# Patient Record
Sex: Male | Born: 1955
Health system: Southern US, Community
[De-identification: ages and names within clinical notes are randomized; demographics above are authoritative.]

## PROBLEM LIST (undated history)

## (undated) DIAGNOSIS — T7840XA Allergy, unspecified, initial encounter: Secondary | ICD-10-CM

## (undated) DIAGNOSIS — I10 Essential (primary) hypertension: Secondary | ICD-10-CM

## (undated) DIAGNOSIS — I2510 Atherosclerotic heart disease of native coronary artery without angina pectoris: Secondary | ICD-10-CM

## (undated) DIAGNOSIS — E785 Hyperlipidemia, unspecified: Secondary | ICD-10-CM

## (undated) DIAGNOSIS — K219 Gastro-esophageal reflux disease without esophagitis: Secondary | ICD-10-CM

## (undated) DIAGNOSIS — J329 Chronic sinusitis, unspecified: Secondary | ICD-10-CM

## (undated) DIAGNOSIS — M1990 Unspecified osteoarthritis, unspecified site: Secondary | ICD-10-CM

## (undated) HISTORY — DX: Unspecified osteoarthritis, unspecified site: M19.90

## (undated) HISTORY — PX: TONSILLECTOMY: SUR1361

## (undated) HISTORY — DX: Chronic sinusitis, unspecified: J32.9

## (undated) HISTORY — DX: Allergy, unspecified, initial encounter: T78.40XA

## (undated) HISTORY — PX: CARDIAC CATHETERIZATION: SHX172

## (undated) HISTORY — PX: OTHER SURGICAL HISTORY: SHX169

## (undated) HISTORY — DX: Essential (primary) hypertension: I10

## (undated) HISTORY — DX: Gastro-esophageal reflux disease without esophagitis: K21.9

---

## 2000-03-24 ENCOUNTER — Ambulatory Visit (HOSPITAL_COMMUNITY): Admission: RE | Admit: 2000-03-24 | Discharge: 2000-03-24 | Payer: Self-pay | Admitting: Gastroenterology

## 2002-10-23 ENCOUNTER — Encounter: Payer: Self-pay | Admitting: Surgery

## 2002-10-23 ENCOUNTER — Encounter: Admission: RE | Admit: 2002-10-23 | Discharge: 2002-10-23 | Payer: Self-pay | Admitting: Surgery

## 2004-05-16 HISTORY — PX: COLONOSCOPY: SHX174

## 2006-01-17 ENCOUNTER — Ambulatory Visit: Payer: Self-pay | Admitting: Family Medicine

## 2006-06-06 ENCOUNTER — Ambulatory Visit: Payer: Self-pay | Admitting: Family Medicine

## 2006-06-12 ENCOUNTER — Ambulatory Visit: Payer: Self-pay | Admitting: Family Medicine

## 2006-12-01 ENCOUNTER — Ambulatory Visit: Payer: Self-pay | Admitting: Family Medicine

## 2006-12-21 ENCOUNTER — Ambulatory Visit: Payer: Self-pay | Admitting: Family Medicine

## 2006-12-27 ENCOUNTER — Encounter: Admission: RE | Admit: 2006-12-27 | Discharge: 2006-12-27 | Payer: Self-pay | Admitting: Family Medicine

## 2007-06-14 ENCOUNTER — Ambulatory Visit: Payer: Self-pay | Admitting: Family Medicine

## 2007-06-26 ENCOUNTER — Ambulatory Visit: Payer: Self-pay | Admitting: Family Medicine

## 2007-07-02 ENCOUNTER — Ambulatory Visit: Payer: Self-pay | Admitting: Cardiology

## 2007-07-05 ENCOUNTER — Ambulatory Visit: Payer: Self-pay | Admitting: Family Medicine

## 2007-07-06 ENCOUNTER — Encounter: Admission: RE | Admit: 2007-07-06 | Discharge: 2007-07-06 | Payer: Self-pay | Admitting: Family Medicine

## 2007-07-13 ENCOUNTER — Encounter: Payer: Self-pay | Admitting: Cardiology

## 2007-07-13 ENCOUNTER — Ambulatory Visit: Payer: Self-pay

## 2007-10-30 ENCOUNTER — Ambulatory Visit: Payer: Self-pay | Admitting: Family Medicine

## 2008-04-22 ENCOUNTER — Ambulatory Visit: Payer: Self-pay | Admitting: Family Medicine

## 2008-08-04 ENCOUNTER — Ambulatory Visit: Payer: Self-pay | Admitting: Family Medicine

## 2008-09-04 ENCOUNTER — Ambulatory Visit: Payer: Self-pay | Admitting: Family Medicine

## 2008-10-21 ENCOUNTER — Ambulatory Visit: Payer: Self-pay | Admitting: Family Medicine

## 2008-10-23 ENCOUNTER — Encounter: Admission: RE | Admit: 2008-10-23 | Discharge: 2008-10-23 | Payer: Self-pay | Admitting: Family Medicine

## 2008-11-25 ENCOUNTER — Ambulatory Visit: Payer: Self-pay | Admitting: Family Medicine

## 2009-04-15 ENCOUNTER — Ambulatory Visit: Payer: Self-pay | Admitting: Family Medicine

## 2009-10-08 ENCOUNTER — Ambulatory Visit: Payer: Self-pay | Admitting: Family Medicine

## 2010-04-23 ENCOUNTER — Ambulatory Visit: Payer: Self-pay | Admitting: Family Medicine

## 2010-09-13 ENCOUNTER — Encounter: Payer: Self-pay | Admitting: Family Medicine

## 2010-09-23 ENCOUNTER — Encounter: Payer: Self-pay | Admitting: Family Medicine

## 2010-09-23 ENCOUNTER — Ambulatory Visit (INDEPENDENT_AMBULATORY_CARE_PROVIDER_SITE_OTHER): Payer: 59 | Admitting: Family Medicine

## 2010-09-23 VITALS — BP 130/90 | HR 60 | Ht 74.4 in | Wt 195.0 lb

## 2010-09-23 DIAGNOSIS — K219 Gastro-esophageal reflux disease without esophagitis: Secondary | ICD-10-CM

## 2010-09-23 DIAGNOSIS — J301 Allergic rhinitis due to pollen: Secondary | ICD-10-CM

## 2010-09-23 DIAGNOSIS — I10 Essential (primary) hypertension: Secondary | ICD-10-CM

## 2010-09-23 DIAGNOSIS — Z0000 Encounter for general adult medical examination without abnormal findings: Secondary | ICD-10-CM

## 2010-09-23 DIAGNOSIS — Z125 Encounter for screening for malignant neoplasm of prostate: Secondary | ICD-10-CM

## 2010-09-23 LAB — POCT URINALYSIS DIPSTICK
Bilirubin, UA: NEGATIVE
Blood, UA: 250
Glucose, UA: NEGATIVE
Ketones, UA: NEGATIVE
Spec Grav, UA: 1.02

## 2010-09-23 LAB — CBC WITH DIFFERENTIAL/PLATELET
Basophils Absolute: 0 10*3/uL (ref 0.0–0.1)
Eosinophils Relative: 2 % (ref 0–5)
Lymphocytes Relative: 36 % (ref 12–46)
Lymphs Abs: 2.2 10*3/uL (ref 0.7–4.0)
MCV: 90.6 fL (ref 78.0–100.0)
Neutro Abs: 3.2 10*3/uL (ref 1.7–7.7)
Platelets: 205 10*3/uL (ref 150–400)
RBC: 5.09 MIL/uL (ref 4.22–5.81)
RDW: 13 % (ref 11.5–15.5)
WBC: 6.1 10*3/uL (ref 4.0–10.5)

## 2010-09-23 LAB — COMPREHENSIVE METABOLIC PANEL
ALT: 19 U/L (ref 0–53)
CO2: 25 mEq/L (ref 19–32)
Calcium: 9.2 mg/dL (ref 8.4–10.5)
Chloride: 103 mEq/L (ref 96–112)
Creat: 0.99 mg/dL (ref 0.40–1.50)
Glucose, Bld: 82 mg/dL (ref 70–99)
Sodium: 140 mEq/L (ref 135–145)
Total Bilirubin: 0.5 mg/dL (ref 0.3–1.2)
Total Protein: 7 g/dL (ref 6.0–8.3)

## 2010-09-23 MED ORDER — PANTOPRAZOLE SODIUM 40 MG PO TBEC: 40.0000 mg | DELAYED_RELEASE_TABLET | Freq: Every day | ORAL | Status: DC

## 2010-09-23 MED ORDER — METOPROLOL SUCCINATE ER 50 MG PO TB24: 50.0000 mg | ORAL_TABLET | Freq: Every day | ORAL | Status: DC

## 2010-09-23 NOTE — Patient Instructions (Signed)
If you have any more trouble with swallowing, please give me a call. Continue on all of your medications.

## 2010-09-23 NOTE — Progress Notes (Signed)
  Subjective:    Patient ID: Andre Martinez, male    DOB: [birth year], 55 y.o.   MRN: [id number]  HPI he is here for a complete examination. He continues on medications listed in his record. His allergies seem to be under good control. He has had some difficulty recently with intermittent swelling and discomfort. He cannot associate this necessarily with followed or liquids. Continues to exercise regularly.    Review of Systems  Constitutional: Negative.   HENT: Negative.   Eyes: Negative.   Respiratory: Negative.   Cardiovascular: Negative.   Genitourinary: Negative.   Musculoskeletal: Negative.   Skin: Negative.   Neurological: Negative.   Psychiatric/Behavioral: Negative.        Objective:   Physical Exam        Assessment & Plan:  BP 130/90  Pulse 60  Ht 6' 2.4" (1.89 m)  Wt 195 lb (88.451 kg)  BMI 24.77 kg/m2  General Appearance:    Alert, cooperative, no distress, appears stated age  Head:    Normocephalic, without obvious abnormality, atraumatic  Eyes:    PERRL, conjunctiva/corneas clear, EOM's intact, fundi    benign  Ears:    Normal TM's and external ear canals  Nose:   Nares normal, mucosa normal, no drainage or sinus   tenderness  Throat:   Lips, mucosa, and tongue normal; teeth and gums normal  Neck:   Supple, no lymphadenopathy;  thyroid:  no   enlargement/tenderness/nodules; no carotid   bruit or JVD  Back:    Spine nontender, no curvature, ROM normal, no CVA     tenderness  Lungs:     Clear to auscultation bilaterally without wheezes, rales or     ronchi; respirations unlabored  Chest Wall:    No tenderness or deformity   Heart:    Regular rate and rhythm, S1 and S2 normal, no murmur, rub   or gallop  Breast Exam:    No chest wall tenderness, masses or gynecomastia  Abdomen:     Soft, non-tender, nondistended, normoactive bowel sounds,    no masses, no hepatosplenomegaly  Genitalia:    Normal male external genitalia without lesions.  Testicles  without masses.  No inguinal hernias.  Rectal:    Normal sphincter tone, no masses or tenderness; guaiac negative stool.  Prostate smooth, no nodules, not enlarged.  Extremities:   No clubbing, cyanosis or edema  Pulses:   2+ and symmetric all extremities  Skin:   Skin color, texture, turgor normal, no rashes or lesions  Lymph nodes:   Cervical, supraclavicular, and axillary nodes normal  Neurologic:   CNII-XII intact, normal strength, sensation and gait; reflexes 2+ and symmetric throughout          Psych:   Normal mood, affect, hygiene and grooming.

## 2010-09-28 NOTE — Letter (Signed)
July 02, 2007    Feliberto Gottron, [facility]  [address]  [place], [place] [address]   RE:  MATHIUS, BIRKELAND  MRN:  [id number]  /  DOB:  08-Oct-1955   Dear Ms. Flemming:   Thank you for your referral of Mr. Mcfall.  As you know, he is a  pleasant 55 year old male with a history of hypertension, recently  treated with Benicar and now metoprolol as well as documentation of  premature ventricular complexes by electrocardiography.  From a  symptomatic perspective, Mr. Palazzi states that he has had some  intermittent chest tightness and also a feeling of soreness in the  anterior portion of his chest, sometimes with activity and sometimes at  rest.  This had been present over the last few months and he has some  difficulty distinguishing this from his typical reflux symptoms.  He  states that sometimes he takes Pepcid AC in addition to his Protonix and  this helps the symptoms, although not consistently.  His  electrocardiogram today shows sinus rhythm with occasional premature  ventricular complexes that are interpolated.  No significant ST-T wave  changes are noted.  Mr. Akhavan has no frank sense of palpitations,  perhaps occasionally a feeling of a skip, but no dizziness or syncope.  He has undergone no prior stress testing.   ALLERGIES:  CODEINE AND PENICILLIN.   MEDICATIONS:  1. Aspirin to 81 mg p.o. daily.  2. Protonix 40 mg p.o. daily.  3. Metoprolol 25 mg p.o. b.i.d.  4. Ambien 10 mg p.o. nightly p.r.n.   PAST MEDICAL HISTORY:  Was reviewed above.  The patient denies any  personal history of diabetes mellitus.  He states he has had mild  increased cholesterol, although never managed with medications.  He had  a tonsillectomy in 1961.   Mr. Coba is married and has two children.  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  He states the exercises by  walking four to five miles a day.  He does drink two cups of coffee and  Diet Coke each day, although  he has been trying to cut back on his  caffeine.  No tobacco or significant alcohol use.   FAMILY HISTORY:  Was reviewed and is noncontributory technically for  premature cardiovascular disease.  The patient states that his maternal  grandfather had some type of mitral valve process in his early 77s.  Also, has a sister with palpitations.   REVIEW OF SYSTEMS:  Is significant for seasonal allergies and reflux.  Otherwise negative.   PHYSICAL EXAMINATION:  Mr. Dismore is a well-nourished, well-developed  male in no acute distress.  His blood pressure is 129/84.  His heart  rate is 64.  His weight is 200 pounds.  HEENT:  Conjunctiva and lids are normal.  Oropharynx clear.  NECK: Is supple.  No elevated jugular venous pressure without bruits. No  thyromegaly is noted.  LUNGS:  Clear without labored breathing at rest.  CARDIAC: Reveals a regular rate and rhythm with occasional ectopic beat.  No loud murmur or S3 gallop or pericardial rub.  ABDOMEN:  Soft, nontender, normoactive bowel sounds.  No bruits.  EXTREMITIES:  Exhibit no pitting edema.  Distal pulses are 2+.  SKIN:  Normal skin is warm and dry.  MUSCULOSKELETAL:  No kyphosis is noted.  NEUROPSYCHIATRIC: The patient is alert and oriented x3.  Affect is  appropriate.   IMPRESSIONS AND RECOMMENDATIONS:  Mr. Patel is a pleasant 55 year old  male with history of hypertension, possibly mild hyperlipidemia, but no  other major cardiac risk factors.  He has had documentation of  occasional premature ventricular complexes on resting  electrocardiography, but no frank rapid palpitations or syncope.  In  addition to this, he has had intermittent chest discomfort as outlined  and has undergone no prior cardiac risk stratification.  We discussed  this some today and our plan will be to proceed with an exercise  echocardiogram to ensure that he has premature ventricular complexes  suppressed with activity and assess him for ischemia as  well as general  cardiac structural defect.  If his testing is overall reassuring I would  anticipate continued efforts at risk factor modification, stress  management and exercise.  We will inform him of the results by phone.    Sincerely,     Jonelle Sidle, MD  Electronically Signed   SGM/MedQ  DD: 07/02/2007  DT: 07/02/2007  Job #: [id number]

## 2010-10-01 NOTE — Procedures (Signed)
[facility]  Patient:    Andre Martinez, Andre Martinez                     MRN: [id number] Proc. Date: 03/24/00 Adm. Date:  [id number] Attending:  Louie Bun CC:         Ronnald Nian, M.D.   Procedure Report  PROCEDURE:  Esophagogastroduodenoscopy with biopsy.  SURGEON:  John C. Madilyn Fireman, M.D.  INDICATIONS FOR PROCEDURE:  Refractory reflux-like dyspepsia, not responding adequately to H2 blockers in a young patient in whom we are considering the appropriateness of antireflux surgery.  Procedure is to assess for presence of esophagitis, hiatal hernia, and to rule out acid peptic disease of the upper GI tract such as Helicobacter gastritis or peptic ulcer disease.  DESCRIPTION OF PROCEDURE:  The patient was placed in the left lateral decubitus position and placed on the pulse monitor with continuous low flow oxygen delivered by nasal cannula.  He was sedated with 60 mg of IV Demerol and 6 mg of IV Versed.  The Olympus video endoscope was advanced under direct vision into the oropharynx and esophagus.  The esophagus was straight and of normal caliber at the squamocolumnar line at 38 cm.  There was no visible hiatal hernia, ring stricture, or other abnormality at the GE junction, although there was some pooled clear secretions in the dependent portions of the esophagus noted at initial intubation.  The stomach was entered and a small amount of liquid secretions were suctioned from the fundus.  A retroflexed view of the cardia was unremarkable.  The fundus and body appeared normal.  The antrum showed one or two streaks of erythema with no discrete erosions or exudate and no ulcers.  A CLOtest was obtained.  The pylorus was non-deformed and easily allowed passage of the endoscope tip into the duodenum.  Both the bulb and second portion were well-inspected and appeared to be within normal limits.  The endoscope was then withdrawn and the patient returned to the  recovery room in stable condition.  He tolerated the procedure well and there were no immediate complications.  IMPRESSION:  Mild antral gastritis, otherwise normal endoscopy with some suggestion of reflux based on the presence of acid in the lower esophagus.  PLAN:  Will discuss the results with the patient and consider referral for antireflux surgery with preoperative esophageal manometry and possible 24-hour pH study. DD:  03/24/00 TD:  03/24/00 Job: [id number]

## 2010-10-08 ENCOUNTER — Ambulatory Visit (INDEPENDENT_AMBULATORY_CARE_PROVIDER_SITE_OTHER): Payer: 59 | Admitting: Family Medicine

## 2010-10-08 ENCOUNTER — Encounter: Payer: Self-pay | Admitting: Family Medicine

## 2010-10-08 VITALS — BP 110/86 | HR 68 | Temp 98.0°F | Ht 74.0 in | Wt 196.0 lb

## 2010-10-08 DIAGNOSIS — J309 Allergic rhinitis, unspecified: Secondary | ICD-10-CM

## 2010-10-08 DIAGNOSIS — J029 Acute pharyngitis, unspecified: Secondary | ICD-10-CM

## 2010-10-08 NOTE — Patient Instructions (Signed)
Avoid decongestants (ie. No "sinus" meds) Start using daily antihistamine (Claritin or Zyrtec, without the "D") Use Mucinex if needed for thick phlegm, postnasal drip, cough If allergies are not adequately controlled with the antihistamine, consider a nasal steroid spray such as Flonase

## 2010-10-08 NOTE — Progress Notes (Signed)
Subjective:    Patient ID: Andre Martinez, male    DOB: Jul 16, 1955, 55 y.o.   MRN: [id number]  HPI Patient presents for evaluation of sore throat for 2 weeks.  Started to get better, then started to get worse again.  Felt scratchy way deep in the throat.  Denies any cold symptoms, or any significant allergy symptoms.  Has some mild allergies, no more than usual.  Reflux has been controlled with his medications.  Recent exposure to someone at the office diagnosed with mono.  No close contact (at a conference, no known shared drinks/food).  No known strep exposure.  Occasionally feels like phlegm sticks in back of throat.  Not currently using anything for allergies except Advil sinus occasionally at night  Past Medical History  Diagnosis Date  . Hemorrhoids   . Allergy   . Arthritis   . GERD (gastroesophageal reflux disease)   . Sinusitis 12/94 3/95  . Hypertension     Past Surgical History  Procedure Date  . Colonoscopy 2006    DR.HAYES  . Rectal fissure     REPAIR    History   Social History  . Marital Status: Married    Spouse Name: N/A    Number of Children: N/A  . Years of Education: N/A   Occupational History  . Not on file.   Social History Main Topics  . Smoking status: Current Some Day Smoker -- .5 years    Types: Cigars  . Smokeless tobacco: Not on file  . Alcohol Use: 1.8 oz/week    3 Cans of beer per week  . Drug Use: No  . Sexually Active: Yes   Other Topics Concern  . Not on file   Social History Narrative  . No narrative on file    Family History  Problem Relation Age of Onset  . Hypertension Mother   . Diabetes Mother     Current outpatient prescriptions:aspirin 81 MG tablet, Take 81 mg by mouth daily.  , Disp: , Rfl: ;  metoprolol (TOPROL-XL) 50 MG 24 hr tablet, Take 1 tablet (50 mg total) by mouth daily., Disp: 90 tablet, Rfl: 4;  pantoprazole (PROTONIX) 40 MG tablet, Take 1 tablet (40 mg total) by mouth daily., Disp: 90 tablet, Rfl: 4;   zolpidem (AMBIEN) 10 MG tablet, Take 10 mg by mouth at bedtime as needed.  , Disp: , Rfl:   Allergies  Allergen Reactions  . Codeine Nausea Only  . Penicillins Rash   Review of Systems Denies fevers, body aches, swollen glands.  Perhaps a little weaker/tired than usual, not significant.  No nausea, vomiting, diarrhea, skin rashes, urinary complaints, joint pains    Objective:   Physical Exam  Well developed, well nourished patient, in no distress BP 110/86  Pulse 68  Temp(Src) 98 F (36.7 C) (Oral)  Ht 6\' 2"  (1.88 m)  Wt 196 lb (88.905 kg)  BMI 25.16 kg/m2 HEENT:  TM's and EAC's normal.  Nasal mucosa mildly edematous with clear mucus. Sinuses nontender.  OP--no erythema, mild cobblestoning posteriorly.  No lesions or exudates Neck: No lymphadenopathy or thyromegaly Heart:  Regular rate and rhythm, no murmurs, rubs, gallops or ectopy Lungs:  Clear bilaterally, without wheezes, rales or ronchi Abdomen:  Soft, nontender, nondistended, no hepatosplenomegaly or masses, normal bowel sounds Extremities:  No clubbing, cyanosis or edema, 2+ pulses.  Skin: no rashes Psych:  Normal mood, affect, hygiene and grooming, normal speech, eye contact      Assessment & Plan:  1. Pharyngitis   2. Allergic rhinitis, cause unspecified     Exam is consistent with pharyngitis related to allergies, not due to infection (strep or mono).  Patient felt comfortable not performing diagnostic tests today.  Avoid decongestants (ie. No "sinus" meds) Start using daily antihistamine (Claritin or Zyrtec, without the "D") Use Mucinex if needed for thick phlegm, postnasal drip, cough If allergies are not adequately controlled with the antihistamine, consider a nasal steroid spray such as Flonase

## 2010-11-15 ENCOUNTER — Encounter: Payer: Self-pay | Admitting: Medical

## 2010-11-15 ENCOUNTER — Ambulatory Visit (INDEPENDENT_AMBULATORY_CARE_PROVIDER_SITE_OTHER): Payer: 59 | Admitting: Medical

## 2010-11-15 VITALS — BP 162/102 | HR 63 | Temp 97.8°F | Ht 74.0 in | Wt 191.0 lb

## 2010-11-15 DIAGNOSIS — I10 Essential (primary) hypertension: Secondary | ICD-10-CM

## 2010-11-15 DIAGNOSIS — R202 Paresthesia of skin: Secondary | ICD-10-CM

## 2010-11-15 DIAGNOSIS — R209 Unspecified disturbances of skin sensation: Secondary | ICD-10-CM

## 2010-11-15 DIAGNOSIS — R42 Dizziness and giddiness: Secondary | ICD-10-CM

## 2010-11-15 MED ORDER — MECLIZINE HCL 25 MG PO TABS: 25.0000 mg | ORAL_TABLET | Freq: Two times a day (BID) | ORAL | Status: DC

## 2010-11-15 NOTE — Progress Notes (Signed)
Subjective:   HPI  Andre Martinez is a 55 y.o. male who presents for not feeling well.  He runs a scout camp, and last few days he has been lightheaded, nauseated, not sleeping good, and feels head pressure.  His head usually feels this way with elevated BP.  Walks daily in general, exercises regularly.  In past gets these feeling with changing meds or with elevated BP.  He does note increased stress of recent handling the scout camp.  He denies any other unusual stressors.  Denies consuming lots of caffeine or salt.  He denies recent change in appetite.  He denies hx/o heart problems or stroke.  He denies any recent weakness or numbness.  He had a stress test a few years ago, and just had his yearly physical in May 2012.  No other aggravating or relieving factors.  No other c/o.  The following portions of the patient's history were reviewed and updated as appropriate: allergies, current medications, past family history, past medical history, past social history, past surgical history and problem list.  Past Medical History  Diagnosis Date  . Hemorrhoids   . Allergy   . Arthritis   . GERD (gastroesophageal reflux disease)   . Sinusitis 12/94 3/95  . Hypertension     Review of Systems Constitutional: +possible LGF, anorexia, fatigue; denies chills, sweats, unexpected weight change Allergy: +chronic congestion, sneezing Dermatology: few bumps on inside of left thigh; ENT: no ear pain, sore throat, hoarseness, sinus pain Cardiology: +some tightness in chest, attributes to acid reflux; has occasional skipped beats; denies edema Respiratory: denies cough, shortness of breath, wheezing, no PND, no orthopnea Gastroenterology: +nauseated, mild diarrhea; denies abdominal pain, vomiting, constipation Hematology: denies bleeding or bruising problems Musculoskeletal: +neck soreness; denies arthralgias, myalgias, joint swelling, back pain Ophthalmology: denies vision changes Urology: denies dysuria,  difficulty urinating, hematuria, urinary frequency, urgency Neurology: +tingling in fingers and feet at times when BP elevated, right arm felt weak last night; some light headache;  no numbness     Objective:   Physical Exam  General appearance: alert, no distress, WD/WN, white male Skin : warm, dry HEENT: normocephalic, sclerae anicteric, PERRLA, EOMi, nares patent, no discharge or erythema, pharynx normal Oral cavity: MMM, no lesions Neck: supple, no lymphadenopathy, no thyromegaly, no masses, no JVD or bruits Heart: RRR, normal S1, S2, no murmurs Lungs: CTA bilaterally, no wheezes, rhonchi, or rales Abdomen: +bs, soft, non tender, non distended, no masses, no hepatomegaly, no splenomegaly Extremities: no edema, no cyanosis, no clubbing Pulses: 2+ symmetric, upper and lower extremities, normal cap refill Neurological: alert, oriented x 3, CN2-12 intact, strength normal upper extremities and lower extremities, sensation normal throughout, DTRs 2+ throughout, no cerebellar signs, gait normal Psychiatric: normal affect, behavior normal, pleasant    Assessment :    Encounter Diagnoses  Name Primary?  Marland Kitchen Unspecified essential hypertension Yes  . Paresthesia   . Dizziness      Plan:  EKG today nsr, normal intervals and normal axis, sinus bradycardia, normal EKG, no acute change compared to 2010 EKG, risk factors - male, age>55, HTN, family hx/o heart dz.  Reviewed 06/2007 stress test which was normal, and prior EKG as well.  Discussed symptoms and case with Dr. Susann Givens.  Symptoms are nonspecific.  No obvious sign of infection. I suspect some of this is stress related.  Advised stress reduction, hydrate well given the extreme heat of late, stay out of the heat when possible, and trial of Meclizine for symptoms.  Recheck in 1 wk, but call if worse or new symptoms in the meantime.  His BP is usually normal.  Will recheck in 1wk.

## 2010-11-22 ENCOUNTER — Ambulatory Visit: Payer: 59 | Admitting: Family Medicine

## 2011-01-10 ENCOUNTER — Encounter: Payer: Self-pay | Admitting: Medical

## 2011-01-10 ENCOUNTER — Ambulatory Visit (INDEPENDENT_AMBULATORY_CARE_PROVIDER_SITE_OTHER): Payer: 59 | Admitting: Medical

## 2011-01-10 VITALS — BP 118/88 | HR 64 | Temp 98.3°F | Resp 20 | Wt 200.0 lb

## 2011-01-10 DIAGNOSIS — IMO0001 Reserved for inherently not codable concepts without codable children: Secondary | ICD-10-CM

## 2011-01-10 DIAGNOSIS — R1310 Dysphagia, unspecified: Secondary | ICD-10-CM

## 2011-01-10 DIAGNOSIS — R51 Headache: Secondary | ICD-10-CM

## 2011-01-10 DIAGNOSIS — M25511 Pain in right shoulder: Secondary | ICD-10-CM

## 2011-01-10 DIAGNOSIS — M791 Myalgia, unspecified site: Secondary | ICD-10-CM

## 2011-01-10 DIAGNOSIS — M25519 Pain in unspecified shoulder: Secondary | ICD-10-CM

## 2011-01-10 LAB — CBC
Hemoglobin: 14.5 g/dL (ref 13.0–17.0)
MCH: 30.1 pg (ref 26.0–34.0)
Platelets: 196 10*3/uL (ref 150–400)
RBC: 4.81 MIL/uL (ref 4.22–5.81)
WBC: 5.4 10*3/uL (ref 4.0–10.5)

## 2011-01-10 LAB — CK: Total CK: 129 U/L (ref 7–232)

## 2011-01-10 LAB — BASIC METABOLIC PANEL
CO2: 28 mEq/L (ref 19–32)
Chloride: 105 mEq/L (ref 96–112)
Creat: 1.06 mg/dL (ref 0.50–1.35)
Potassium: 4.3 mEq/L (ref 3.5–5.3)
Sodium: 143 mEq/L (ref 135–145)

## 2011-01-10 MED ORDER — AZITHROMYCIN 500 MG PO TABS: 500.0000 mg | ORAL_TABLET | Freq: Every day | ORAL | Status: AC

## 2011-01-10 MED ORDER — TIZANIDINE HCL 4 MG PO TABS: 4.0000 mg | ORAL_TABLET | Freq: Every day | ORAL | Status: DC

## 2011-01-10 MED ORDER — METOPROLOL SUCCINATE ER 50 MG PO TB24: 50.0000 mg | ORAL_TABLET | Freq: Every day | ORAL | Status: DC

## 2011-01-10 MED ORDER — PANTOPRAZOLE SODIUM 40 MG PO TBEC: 40.0000 mg | DELAYED_RELEASE_TABLET | Freq: Every day | ORAL | Status: DC

## 2011-01-10 NOTE — Progress Notes (Signed)
Subjective:   HPI  Andre Martinez is a 55 y.o. male who presents for not feeling well.  I saw him for similar problem on 11/15/10.  At that time he was mainly complaining of dizziness, fatigue, and had some head pressure.   At that time I had him try some meclizine which didn't help.  He reports multiple c/o today.  He notes somewhat regularly and routinely now feeling achiness/discomfort in head and neck, gets similar sensation across both shoulders, gets mild chest burning pain, gets slight nausea, and feels weak or tired during the day.  Getse some tingling in hands and toes, like they are going to sleep.  Symptoms started back in the summer and have gradually gotten worse.  He notes using elliptical 40 minutes daily without problems.  Does several sets of pushups in the morning and nights.  Lately sleep has been interfered with these symptoms.  Denies consuming lots of caffeine or salt.  He denies recent change in appetite.  He denies hx/o heart problems or stroke.  He denies any recent weakness or numbness.  He had a stress test a few years ago, and just had his yearly physical in May 2012.  No other aggravating or relieving factors.    He notes hx/o bad GERD, takes meds daily.  He also has been having difficulty swallowing both liquids and solids.  Has discussed this with Dr. Susann Givens prior, but the problem is getting worse.  Has seen GI in the past, Dr. Madilyn Fireman, and would like referral back to them.   The following portions of the patient's history were reviewed and updated as appropriate: allergies, current medications, past family history, past medical history, past social history, past surgical history and problem list.  Past Medical History  Diagnosis Date  . Hemorrhoids   . Allergy   . Arthritis   . GERD (gastroesophageal reflux disease)   . Sinusitis 12/94 3/95  . Hypertension    Review of Systems Gen:  No fever, chills, sweats, weight changes HEENT: +mild sinus pressure, mild sore  throat, sinus congestion just the last few days Lungs: mild cough, but no SOB, wheezing Heart: burning pain in chest/heartburn, but no other chest pain, no palpitations, no syncope or presyncope, no LE edema GI: + mild nausea, no vomiting, diarrhea, constipation, blood in stool Neuro: +tingling in fingers and feet at times, no numbness   Objective:   Physical Exam  General appearance: alert, no distress, WD/WN, white male Skin : warm, dry HEENT: normocephalic, sclerae anicteric, PERRLA, EOMi, nares patent, no discharge or erythema, pharynx normal Oral cavity: MMM, no lesions Neck: supple, no lymphadenopathy, no thyromegaly, no masses, no JVD or bruits Heart: RRR, normal S1, S2, no murmurs Lungs: CTA bilaterally, no wheezes, rhonchi, or rales Abdomen: +bs, soft, non tender, non distended, no masses, no hepatomegaly, no splenomegaly Extremities: no edema, no cyanosis, no clubbing Pulses: 2+ symmetric, upper and lower extremities, normal cap refill Neurological: alert, oriented x 3, CN2-12 intact, strength normal upper extremities and lower extremities, sensation normal throughout, DTRs 2+ throughout, no cerebellar signs, gait normal Psychiatric: normal affect, behavior normal, pleasant    Assessment :    Encounter Diagnoses  Name Primary?  . Cephalgia Yes  . Shoulder pain, bilateral   . Myalgia   . Difficulty in swallowing      Plan:  I had reviewed an EKG last visit.  At this point symptoms are vague.  Will treat for sinus infection vs eustachian tube dysfunction.  Advised symptomatic therapy.  Will use trial of muscle relaxer at night for shoulder girdle and neck spasm/soreness.  Labs today.    Will refer back to GI/Dr. Madilyn Fireman for worsening GERD, recent problems swallowing.    If no improvement in 2 wk, call/return.  We can consider imaging if needed.

## 2011-01-11 LAB — SEDIMENTATION RATE: Sed Rate: 14 mm/hr (ref 0–16)

## 2011-01-11 NOTE — Progress Notes (Signed)
Pt notified of lab results.  Pt will call report in 10-14 days and will follow up with GI referral.  CM, LPN

## 2011-01-21 ENCOUNTER — Other Ambulatory Visit: Payer: Self-pay | Admitting: Gastroenterology

## 2011-01-21 DIAGNOSIS — R1310 Dysphagia, unspecified: Secondary | ICD-10-CM

## 2011-01-24 ENCOUNTER — Telehealth: Payer: Self-pay | Admitting: Family Medicine

## 2011-01-24 NOTE — Telephone Encounter (Signed)
Please call concerning his last visit, he finished z pak did not clear up. Please call patient  [facility]

## 2011-01-24 NOTE — Telephone Encounter (Signed)
At last visit his symptoms were non specific, thus we treated for possible sinus infection and referred back to GI.  Has he seen GI?  What are his current symptoms?

## 2011-01-24 NOTE — Telephone Encounter (Signed)
If not on any decongestant/cough med, then begin some OTC Benadryl at bedtime and consider Mucinex DM or Coricidin HBP, hydrate well.  If not improving by Thursday/Friday, let me know.

## 2011-01-24 NOTE — Telephone Encounter (Signed)
Pt called and pt is not on any decongestant for cough.  Pt agreed to take benadryl at bedtime and Mucinex DM or Coricidin HBP and will drink plenty of fluids.  Pt will call back Thursday or Friday if not any better. CM,LPN

## 2011-01-24 NOTE — Telephone Encounter (Signed)
Called and spoke to pt regarding symptoms.  He still has congestion, cough and sinus still draining.  Pt stated that he seen Dr. Madilyn Fireman on Friday 01-21-11.  Pt will go to [facility] on Wednesday 01-26-11 to get xray of the throat and to do a barium swallow because he is having trouble with swallowing.  CM, LPN

## 2011-01-26 ENCOUNTER — Ambulatory Visit
Admission: RE | Admit: 2011-01-26 | Discharge: 2011-01-26 | Disposition: A | Discharge status: Home / Self Care | Payer: 59 | Source: Ambulatory Visit | Attending: Gastroenterology | Admitting: Gastroenterology

## 2011-01-26 DIAGNOSIS — R1310 Dysphagia, unspecified: Secondary | ICD-10-CM

## 2011-01-31 ENCOUNTER — Telehealth: Payer: Self-pay | Admitting: Medical

## 2011-01-31 ENCOUNTER — Encounter: Payer: Self-pay | Admitting: Medical

## 2011-02-01 NOTE — Telephone Encounter (Signed)
We had referred to GI who I suspect ordered the studies that is he referring to.   I don't recall [place] ordering any studies since we were referring to GI.  Confirm he is talking about swallow study?  If so, results need to come from GI.  I don't have any notes from GI yet though.

## 2011-02-03 NOTE — Telephone Encounter (Signed)
Left message for pt to return call.  Pt hasn't returned call.  CM,LPN

## 2011-02-03 NOTE — Telephone Encounter (Signed)
See what GI told him as far as diagnosis/treatment plan?  I reviewed the barium swallow results.

## 2011-02-03 NOTE — Telephone Encounter (Signed)
Forwarded to [employer]. CM, LPN

## 2011-02-04 NOTE — Telephone Encounter (Signed)
I SPOKE WITH THE PATIENT AND HE SAID DR. Madilyn Fireman WHO PREFORMED THE GI STUDY ON HIM SAID HIS GI TRACK HAD NARROWED AND IT NEEDED TO BE STRETCH AND SAID HE NEEDED ENDOSCOPY. HE SAID TO LET YOU HE TOOK HIS ADVISE AND HE HAS A APPT. WITH ENDOSCOPY ON 02/17/11. CLS

## 2011-02-25 LAB — HM COLONOSCOPY

## 2011-03-09 ENCOUNTER — Telehealth: Payer: Self-pay | Admitting: Medical

## 2011-03-11 ENCOUNTER — Other Ambulatory Visit: Payer: Self-pay | Admitting: Medical

## 2011-03-11 MED ORDER — TIZANIDINE HCL 4 MG PO TABS: 4.0000 mg | ORAL_TABLET | Freq: Every day | ORAL | Status: DC

## 2011-03-11 MED ORDER — TIZANIDINE HCL 4 MG PO TABS: 4.0000 mg | ORAL_TABLET | Freq: Every day | ORAL | Status: AC

## 2011-03-11 NOTE — Telephone Encounter (Signed)
CALLED [facility]. CANCELLED TIZANIDINE ORDER.

## 2011-03-11 NOTE — Telephone Encounter (Signed)
pls call [facility] and cancel the tizanidine.  I accidentally sent it to both there and [facility].

## 2011-10-13 ENCOUNTER — Encounter: Payer: Self-pay | Admitting: Internal Medicine

## 2011-10-18 ENCOUNTER — Encounter: Payer: Self-pay | Admitting: Family Medicine

## 2011-10-18 ENCOUNTER — Ambulatory Visit (INDEPENDENT_AMBULATORY_CARE_PROVIDER_SITE_OTHER): Payer: 59 | Admitting: Family Medicine

## 2011-10-18 ENCOUNTER — Telehealth: Payer: Self-pay | Admitting: Internal Medicine

## 2011-10-18 VITALS — BP 138/90 | HR 64 | Ht 74.0 in | Wt 203.0 lb

## 2011-10-18 DIAGNOSIS — M129 Arthropathy, unspecified: Secondary | ICD-10-CM

## 2011-10-18 DIAGNOSIS — Z23 Encounter for immunization: Secondary | ICD-10-CM

## 2011-10-18 DIAGNOSIS — Z0000 Encounter for general adult medical examination without abnormal findings: Secondary | ICD-10-CM

## 2011-10-18 DIAGNOSIS — Z566 Other physical and mental strain related to work: Secondary | ICD-10-CM

## 2011-10-18 DIAGNOSIS — I499 Cardiac arrhythmia, unspecified: Secondary | ICD-10-CM | POA: Insufficient documentation

## 2011-10-18 DIAGNOSIS — K219 Gastro-esophageal reflux disease without esophagitis: Secondary | ICD-10-CM | POA: Insufficient documentation

## 2011-10-18 DIAGNOSIS — Z569 Unspecified problems related to employment: Secondary | ICD-10-CM

## 2011-10-18 DIAGNOSIS — I10 Essential (primary) hypertension: Secondary | ICD-10-CM

## 2011-10-18 DIAGNOSIS — M1990 Unspecified osteoarthritis, unspecified site: Secondary | ICD-10-CM

## 2011-10-18 LAB — CBC WITH DIFFERENTIAL/PLATELET
Eosinophils Absolute: 0.1 10*3/uL (ref 0.0–0.7)
Eosinophils Relative: 1 % (ref 0–5)
HCT: 44.6 % (ref 39.0–52.0)
Hemoglobin: 15.3 g/dL (ref 13.0–17.0)
Lymphs Abs: 2 10*3/uL (ref 0.7–4.0)
MCH: 31.5 pg (ref 26.0–34.0)
MCV: 92 fL (ref 78.0–100.0)
Monocytes Relative: 8 % (ref 3–12)
RBC: 4.85 MIL/uL (ref 4.22–5.81)

## 2011-10-18 LAB — POCT URINALYSIS DIPSTICK
Bilirubin, UA: NEGATIVE
Ketones, UA: NEGATIVE
Leukocytes, UA: NEGATIVE
Spec Grav, UA: 1.01
pH, UA: 5

## 2011-10-18 LAB — HEMOCCULT GUIAC POC 1CARD (OFFICE)

## 2011-10-18 MED ORDER — METOPROLOL SUCCINATE ER 50 MG PO TB24: 50.0000 mg | ORAL_TABLET | Freq: Every day | ORAL | Status: DC

## 2011-10-18 MED ORDER — PANTOPRAZOLE SODIUM 40 MG PO TBEC: 40.0000 mg | DELAYED_RELEASE_TABLET | Freq: Every day | ORAL | Status: DC

## 2011-10-18 MED ORDER — ZOLPIDEM TARTRATE 10 MG PO TABS: 10.0000 mg | ORAL_TABLET | Freq: Every evening | ORAL | Status: DC | PRN

## 2011-10-18 NOTE — Telephone Encounter (Signed)
done

## 2011-10-18 NOTE — Progress Notes (Signed)
Subjective:    Patient ID: Andre Martinez, male    DOB: 1956-04-11, 55 y.o.   MRN: [id number]  HPI He is here for complete examination. This time of year at work is quite stressful. 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 He has had difficulty with sleep issues because of this. He does exercise regularly and is now using elliptical which is easier on his knees. His allergies give him very little difficulty and he will occasionally use an OTC medication for this. He continues on his blood pressure medication as well as Protonix for his reflux. His social and family history are reviewed and are unchanged.   Review of Systems  Constitutional: Negative.   Respiratory: Negative.   Gastrointestinal: Negative.   Musculoskeletal: Positive for arthralgias.  Skin: Negative.   Hematological: Negative.   Psychiatric/Behavioral: Positive for sleep disturbance.       Objective:   Physical Exam BP 138/90  Pulse 64  Ht 6\' 2"  (1.88 m)  Wt 203 lb (92.08 kg)  BMI 26.06 kg/m2  General Appearance:    Alert, cooperative, no distress, appears stated age  Head:    Normocephalic, without obvious abnormality, atraumatic  Eyes:    PERRL, conjunctiva/corneas clear, EOM's intact, fundi    benign  Ears:    Normal TM's and external ear canals  Nose:   Nares normal, mucosa normal, no drainage or sinus   tenderness  Throat:   Lips, mucosa, and tongue normal; teeth and gums normal  Neck:   Supple, no lymphadenopathy;  thyroid:  no   enlargement/tenderness/nodules; no carotid   bruit or JVD  Back:    Spine nontender, no curvature, ROM normal, no CVA     tenderness  Lungs:     Clear to auscultation bilaterally without wheezes, rales or     ronchi; respirations unlabored  Chest Wall:    No tenderness or deformity   Heart:    Regular rate and rhythm, S1 and S2 normal, no murmur, rub   or gallop  Breast Exam:    No chest wall  tenderness, masses or gynecomastia  Abdomen:     Soft, non-tender, nondistended, normoactive bowel sounds,    no masses, no hepatosplenomegaly  Genitalia:    Normal male external genitalia without lesions.  Testicles without masses.  No inguinal hernias.  Rectal:    Normal sphincter tone, no masses or tenderness; guaiac negative stool.  Prostate smooth, no nodules, not enlarged.  Extremities:   No clubbing, cyanosis or edema  Pulses:   2+ and symmetric all extremities  Skin:   Skin color, texture, turgor normal, no rashes or lesions  Lymph nodes:   Cervical, supraclavicular, and axillary nodes normal  Neurologic:   CNII-XII intact, normal strength, sensation and gait; reflexes 2+ and symmetric throughout          Psych:   Normal mood, affect, hygiene and grooming.           Assessment & Plan:   1. Routine general medical examination at a health care facility  POCT Urinalysis Dipstick, Tdap vaccine greater than or equal to 7yo IM, CBC with Differential, Comprehensive metabolic panel, Lipid panel, Hemoccult - 1 Card (office)  2. GERD (gastroesophageal reflux disease)  pantoprazole (PROTONIX) 40 MG tablet, DISCONTINUED: pantoprazole (PROTONIX) 40 MG tablet  3. Hypertension  metoprolol succinate (TOPROL-XL) 50 MG 24 hr tablet, DISCONTINUED: metoprolol succinate (TOPROL-XL) 50 MG 24 hr tablet  4. Arthritis    5. Work-related stress     discuss work-related stress with him in regard to using the serenity prayer. Also discussed various stress reducing techniques with him. He uses Ambien very sparingly so this will be renewed. Continue on his present medications. Discussed PSA testing and he declined

## 2011-10-19 ENCOUNTER — Telehealth: Payer: Self-pay

## 2011-10-19 LAB — LIPID PANEL
Cholesterol: 182 mg/dL (ref 0–200)
Total CHOL/HDL Ratio: 3.9 Ratio
Triglycerides: 117 mg/dL (ref <150)
VLDL: 23 mg/dL (ref 0–40)

## 2011-10-19 LAB — COMPREHENSIVE METABOLIC PANEL
CO2: 49 mEq/L — ABNORMAL HIGH (ref 19–32)
Calcium: 8.9 mg/dL (ref 8.4–10.5)
Creat: 0.95 mg/dL (ref 0.50–1.35)
Glucose, Bld: 76 mg/dL (ref 70–99)
Total Bilirubin: 0.5 mg/dL (ref 0.3–1.2)
Total Protein: 6.5 g/dL (ref 6.0–8.3)

## 2011-10-19 NOTE — Progress Notes (Signed)
Quick Note:  The blood work is normal ______ 

## 2011-10-19 NOTE — Telephone Encounter (Signed)
[facility] CALLED AND SAID THEY HAD A REPORT FOR HIM CO2 49 HIGH REP. AND VERIFIED

## 2012-01-05 ENCOUNTER — Telehealth: Payer: Self-pay | Admitting: Internal Medicine

## 2012-01-05 DIAGNOSIS — I10 Essential (primary) hypertension: Secondary | ICD-10-CM

## 2012-01-05 DIAGNOSIS — K219 Gastro-esophageal reflux disease without esophagitis: Secondary | ICD-10-CM

## 2012-01-05 MED ORDER — PANTOPRAZOLE SODIUM 40 MG PO TBEC: 40.0000 mg | DELAYED_RELEASE_TABLET | Freq: Every day | ORAL | Status: DC

## 2012-01-05 MED ORDER — METOPROLOL SUCCINATE ER 50 MG PO TB24: 50.0000 mg | ORAL_TABLET | Freq: Every day | ORAL | Status: DC

## 2012-01-05 NOTE — Telephone Encounter (Signed)
Sent meds in

## 2012-09-21 ENCOUNTER — Telehealth: Payer: Self-pay | Admitting: Family Medicine

## 2012-09-21 ENCOUNTER — Other Ambulatory Visit: Payer: Self-pay | Admitting: Medical

## 2012-09-21 DIAGNOSIS — K219 Gastro-esophageal reflux disease without esophagitis: Secondary | ICD-10-CM

## 2012-09-21 MED ORDER — PANTOPRAZOLE SODIUM 40 MG PO TBEC: 40.0000 mg | DELAYED_RELEASE_TABLET | Freq: Every day | ORAL | Status: DC

## 2012-09-21 NOTE — Telephone Encounter (Signed)
Andre Martinez is this okay to do? CLS

## 2012-09-25 ENCOUNTER — Other Ambulatory Visit: Payer: Self-pay

## 2012-10-29 ENCOUNTER — Encounter: Payer: Self-pay | Admitting: Family Medicine

## 2012-10-29 ENCOUNTER — Ambulatory Visit (INDEPENDENT_AMBULATORY_CARE_PROVIDER_SITE_OTHER): Payer: 59 | Admitting: Family Medicine

## 2012-10-29 VITALS — BP 130/82 | HR 65 | Ht 73.5 in | Wt 195.0 lb

## 2012-10-29 DIAGNOSIS — I10 Essential (primary) hypertension: Secondary | ICD-10-CM

## 2012-10-29 DIAGNOSIS — Z125 Encounter for screening for malignant neoplasm of prostate: Secondary | ICD-10-CM

## 2012-10-29 DIAGNOSIS — Z0000 Encounter for general adult medical examination without abnormal findings: Secondary | ICD-10-CM

## 2012-10-29 DIAGNOSIS — M25522 Pain in left elbow: Secondary | ICD-10-CM

## 2012-10-29 DIAGNOSIS — J309 Allergic rhinitis, unspecified: Secondary | ICD-10-CM

## 2012-10-29 DIAGNOSIS — M1990 Unspecified osteoarthritis, unspecified site: Secondary | ICD-10-CM

## 2012-10-29 DIAGNOSIS — M129 Arthropathy, unspecified: Secondary | ICD-10-CM

## 2012-10-29 DIAGNOSIS — M25529 Pain in unspecified elbow: Secondary | ICD-10-CM

## 2012-10-29 DIAGNOSIS — K219 Gastro-esophageal reflux disease without esophagitis: Secondary | ICD-10-CM

## 2012-10-29 LAB — CBC WITH DIFFERENTIAL/PLATELET
Basophils Relative: 1 % (ref 0–1)
Eosinophils Absolute: 0.2 10*3/uL (ref 0.0–0.7)
HCT: 44.5 % (ref 39.0–52.0)
Hemoglobin: 15.1 g/dL (ref 13.0–17.0)
MCH: 30.3 pg (ref 26.0–34.0)
MCHC: 33.9 g/dL (ref 30.0–36.0)
MCV: 89.2 fL (ref 78.0–100.0)
Monocytes Absolute: 0.5 10*3/uL (ref 0.1–1.0)
Monocytes Relative: 10 % (ref 3–12)

## 2012-10-29 LAB — LIPID PANEL
Cholesterol: 171 mg/dL (ref 0–200)
HDL: 52 mg/dL (ref >39)
LDL Cholesterol: 100 mg/dL — ABNORMAL HIGH (ref 0–99)
Triglycerides: 93 mg/dL (ref <150)
VLDL: 19 mg/dL (ref 0–40)

## 2012-10-29 LAB — POCT URINALYSIS DIPSTICK
Bilirubin, UA: NEGATIVE
Blood, UA: NEGATIVE
Ketones, UA: NEGATIVE
Nitrite, UA: NEGATIVE
pH, UA: 5

## 2012-10-29 LAB — COMPREHENSIVE METABOLIC PANEL
Albumin: 4.2 g/dL (ref 3.5–5.2)
Alkaline Phosphatase: 72 U/L (ref 39–117)
BUN: 16 mg/dL (ref 6–23)
Glucose, Bld: 89 mg/dL (ref 70–99)
Total Bilirubin: 0.4 mg/dL (ref 0.3–1.2)

## 2012-10-29 NOTE — Progress Notes (Signed)
Subjective:    Patient ID: Andre Martinez, male    DOB: Apr 18, 1956, 57 y.o.   MRN: [id number]  HPI He is here for complete examination. He has recently retired from the [employer]. He continues to have difficulty with swallowing and notes that liquids have more of a problem than solids. He does point to one area where it feels like he always gets liquids stuck. He has seen Dr. Madilyn Fireman .for this in the past and did have a swallowing study which was apparently negative. He is to having allergy symptoms of nasal congestion, headache, occasional dry cough no sneezing, itchy watery eyes rhinorrhea. He does take an Advil sinus before he goes to bed.  He is also noted tingling and numbness in his hands in the middle of the night. It does wake him up. He can shake his hands and the symptoms go away. He also describes left elbow  locking sensation. It is intermittent and it does tend to go away. No other joints are involved.   Review of Systems Negative except as above    Objective:   Physical Exam BP 130/82  Pulse 65  Ht 6' 1.5" (1.867 m)  Wt 195 lb (88.451 kg)  BMI 25.38 kg/m2  SpO2 98%  General Appearance:    Alert, cooperative, no distress, appears stated age  Head:    Normocephalic, without obvious abnormality, atraumatic  Eyes:    PERRL, conjunctiva/corneas clear, EOM's intact, fundi    benign  Ears:    Normal TM's and external ear canals  Nose:   Nares normal, mucosa normal, no drainage or sinus   tenderness  Throat:   Lips, mucosa, and tongue normal; teeth and gums normal  Neck:   Supple, no lymphadenopathy;  thyroid:  no   enlargement/tenderness/nodules; no carotid   bruit or JVD  Back:    Spine nontender, no curvature, ROM normal, no CVA     tenderness  Lungs:     Clear to auscultation bilaterally without wheezes, rales or     ronchi; respirations unlabored  Chest Wall:    No tenderness or deformity   Heart:    Regular rate and rhythm, S1 and S2 normal, no murmur, rub   or gallop   Breast Exam:    No chest wall tenderness, masses or gynecomastia  Abdomen:     Soft, non-tender, nondistended, normoactive bowel sounds,    no masses, no hepatosplenomegaly  Genitalia:    Normal male external genitalia without lesions.  Testicles without masses.  No inguinal hernias.  Rectal:  deferred  Extremities:   No clubbing, cyanosis or edema  Pulses:   2+ and symmetric all extremities  Skin:   Skin color, texture, turgor normal, no rashes or lesions  Lymph nodes:   Cervical, supraclavicular, and axillary nodes normal  Neurologic:   CNII-XII intact, normal strength, sensation and gait; reflexes 2+ and symmetric throughout          Psych:   Normal mood, affect, hygiene and grooming.          Assessment & Plan:  Routine general medical examination at a health care facility - Plan: CBC with Differential, Comprehensive metabolic panel, Lipid panel, PSA  Hypertension - Plan: POCT urinalysis dipstick  GERD (gastroesophageal reflux disease) - Plan: Ambulatory referral to Gastroenterology  Arthritis  Allergic rhinitis  Elbow pain, left - Plan: DG Elbow Complete Left  Special screening for malignant neoplasm of prostate - Plan: PSA recommend he treat his arthritis symptoms initially  with Tylenol. Continue to treat his allergies as he has been. Followup on the elbow pain after he has the x-rays.

## 2012-10-30 ENCOUNTER — Other Ambulatory Visit: Payer: Self-pay

## 2012-10-30 DIAGNOSIS — K219 Gastro-esophageal reflux disease without esophagitis: Secondary | ICD-10-CM

## 2012-10-30 DIAGNOSIS — I10 Essential (primary) hypertension: Secondary | ICD-10-CM

## 2012-10-30 MED ORDER — PANTOPRAZOLE SODIUM 40 MG PO TBEC: 40.0000 mg | DELAYED_RELEASE_TABLET | Freq: Every day | ORAL | Status: DC

## 2012-10-30 MED ORDER — METOPROLOL SUCCINATE ER 50 MG PO TB24: 50.0000 mg | ORAL_TABLET | Freq: Every day | ORAL | Status: DC

## 2012-10-30 NOTE — Progress Notes (Signed)
Quick Note:  PT INFORMED AND VERBALIZED UNDERSTANDING MAILED PT A COPY OF LABS ______

## 2012-10-30 NOTE — Telephone Encounter (Signed)
SENT MEDS IN  

## 2013-04-08 HISTORY — PX: UPPER GI ENDOSCOPY: SHX6162

## 2013-04-18 ENCOUNTER — Encounter: Payer: Self-pay | Admitting: Internal Medicine

## 2013-04-25 ENCOUNTER — Encounter: Payer: Self-pay | Admitting: Family Medicine

## 2013-11-11 ENCOUNTER — Other Ambulatory Visit: Payer: Self-pay | Admitting: Family Medicine

## 2014-01-16 ENCOUNTER — Ambulatory Visit (INDEPENDENT_AMBULATORY_CARE_PROVIDER_SITE_OTHER): Payer: 59 | Admitting: Family Medicine

## 2014-01-16 ENCOUNTER — Encounter: Payer: Self-pay | Admitting: Family Medicine

## 2014-01-16 VITALS — BP 112/70 | HR 60 | Ht 73.5 in | Wt 197.0 lb

## 2014-01-16 DIAGNOSIS — Z23 Encounter for immunization: Secondary | ICD-10-CM

## 2014-01-16 DIAGNOSIS — M1990 Unspecified osteoarthritis, unspecified site: Secondary | ICD-10-CM

## 2014-01-16 DIAGNOSIS — K219 Gastro-esophageal reflux disease without esophagitis: Secondary | ICD-10-CM

## 2014-01-16 DIAGNOSIS — J301 Allergic rhinitis due to pollen: Secondary | ICD-10-CM

## 2014-01-16 DIAGNOSIS — M129 Arthropathy, unspecified: Secondary | ICD-10-CM

## 2014-01-16 DIAGNOSIS — I10 Essential (primary) hypertension: Secondary | ICD-10-CM

## 2014-01-16 LAB — POCT URINALYSIS DIPSTICK
Bilirubin, UA: NEGATIVE
Blood, UA: NEGATIVE
Glucose, UA: NEGATIVE
KETONES UA: NEGATIVE
Leukocytes, UA: NEGATIVE
NITRITE UA: NEGATIVE
PROTEIN UA: NEGATIVE
Spec Grav, UA: 1.02
Urobilinogen, UA: NEGATIVE
pH, UA: 5

## 2014-01-16 NOTE — Progress Notes (Signed)
   Subjective:    Patient ID: Andre Martinez, male    DOB: [birth year], 58 y.o.   MRN: [id number]  HPI He is here for complete examination. He has difficulty with arthritis mainly in his knees but takes care of this on his own. He also still has some difficulty with reflux disease complaining of some dysphagia again this occurs intermittently. His allergies are under good control. He is now retired and very much enjoying his retirement. He exercises regularly as well as keeps his mind occupied. His marriage is going well .He has no other concerns or complaints. His social and family history were reviewed.   Review of Systems  All other systems reviewed and are negative.      Objective:   Physical Exam BP 112/70  Pulse 60  Ht 6' 1.5" (1.867 m)  Wt 197 lb (89.359 kg)  BMI 25.64 kg/m2  SpO2 97%  General Appearance:    Alert, cooperative, no distress, appears stated age  Head:     I encouraged him to continue to take good care of himself theNormocephalic, without obvious abnormality, atraumatic  Eyes:    PERRL, conjunctiva/corneas clear, EOM's intact, fundi    benign  Ears:    Normal TM's and external ear canals  Nose:   Nares normal, mucosa normal, no drainage or sinus   tenderness  Throat:   Lips, mucosa, and tongue normal; teeth and gums normal  Neck:   Supple, no lymphadenopathy;  thyroid:  no   enlargement/tenderness/nodules; no carotid   bruit or JVD  Back:    Spine nontender, no curvature, ROM normal, no CVA     tenderness  Lungs:     Clear to auscultation bilaterally without wheezes, rales or     ronchi; respirations unlabored  Chest Wall:    No tenderness or deformity   Heart:    Regular rate and rhythm, S1 and S2 normal, no murmur, rub   or gallop  Breast Exam:    No chest wall tenderness, masses or gynecomastia  Abdomen:     Soft, non-tender, nondistended, normoactive bowel sounds,    no masses, no hepatosplenomegaly        Extremities:   No clubbing, cyanosis or  edema  Pulses:   2+ and symmetric all extremities  Skin:   Skin color, texture, turgor normal, no rashes or lesions  Lymph nodes:   Cervical, supraclavicular, and axillary nodes normal  Neurologic:   CNII-XII intact, normal strength, sensation and gait; reflexes 2+ and symmetric throughout          Psych:   Normal mood, affect, hygiene and grooming.          Assessment & Plan:  Essential hypertension - Plan: POCT Urinalysis Dipstick  Gastroesophageal reflux disease without esophagitis  Arthritis  Allergic rhinitis due to pollen  Need for prophylactic vaccination and inoculation against influenza - Plan: Flu Vaccine QUAD 36+ mos IM

## 2014-04-16 ENCOUNTER — Telehealth: Payer: Self-pay | Admitting: Family Medicine

## 2014-04-16 NOTE — Telephone Encounter (Signed)
Go ahead and set him up for the shot. Gave him the usual information concerning shingles vaccine.

## 2014-04-16 NOTE — Telephone Encounter (Signed)
Pt says he was advised by Dr Redmond School to get shingles shot. Insurance will cover after age 60 so he can get it now. Pt scheduled for 12/7 but wants to know for sure if he is ok to get vaccine given meds and health issues. Also wants to know if there are any side effects to vaccine

## 2014-04-16 NOTE — Telephone Encounter (Signed)
Pt informed of all side effects per vis sheet and verbalized understanding

## 2014-04-21 ENCOUNTER — Other Ambulatory Visit (INDEPENDENT_AMBULATORY_CARE_PROVIDER_SITE_OTHER): Payer: 59

## 2014-04-21 DIAGNOSIS — Z23 Encounter for immunization: Secondary | ICD-10-CM

## 2014-05-27 ENCOUNTER — Other Ambulatory Visit: Payer: Self-pay

## 2014-05-27 ENCOUNTER — Telehealth: Payer: Self-pay | Admitting: Family Medicine

## 2014-05-27 DIAGNOSIS — K219 Gastro-esophageal reflux disease without esophagitis: Secondary | ICD-10-CM

## 2014-05-27 MED ORDER — PANTOPRAZOLE SODIUM 40 MG PO TBEC: 40.0000 mg | DELAYED_RELEASE_TABLET | Freq: Every day | ORAL | Status: DC

## 2014-05-27 NOTE — Telephone Encounter (Signed)
Pt needs RF Pantoprazole to [facility] for 90 days

## 2014-05-27 NOTE — Telephone Encounter (Signed)
done

## 2014-11-13 ENCOUNTER — Ambulatory Visit (INDEPENDENT_AMBULATORY_CARE_PROVIDER_SITE_OTHER): Payer: 59 | Admitting: Family Medicine

## 2014-11-13 ENCOUNTER — Encounter: Payer: Self-pay | Admitting: Family Medicine

## 2014-11-13 VITALS — BP 128/90 | HR 70 | Ht 74.0 in | Wt 197.0 lb

## 2014-11-13 DIAGNOSIS — J301 Allergic rhinitis due to pollen: Secondary | ICD-10-CM

## 2014-11-13 DIAGNOSIS — I10 Essential (primary) hypertension: Secondary | ICD-10-CM | POA: Diagnosis not present

## 2014-11-13 DIAGNOSIS — M1990 Unspecified osteoarthritis, unspecified site: Secondary | ICD-10-CM

## 2014-11-13 DIAGNOSIS — N528 Other male erectile dysfunction: Secondary | ICD-10-CM

## 2014-11-13 DIAGNOSIS — Z0000 Encounter for general adult medical examination without abnormal findings: Secondary | ICD-10-CM

## 2014-11-13 DIAGNOSIS — K219 Gastro-esophageal reflux disease without esophagitis: Secondary | ICD-10-CM | POA: Diagnosis not present

## 2014-11-13 LAB — LIPID PANEL
CHOL/HDL RATIO: 3.4 ratio
CHOLESTEROL: 184 mg/dL (ref 0–200)
HDL: 54 mg/dL (ref >=40)
LDL Cholesterol: 110 mg/dL — ABNORMAL HIGH (ref 0–99)
Triglycerides: 98 mg/dL (ref <150)
VLDL: 20 mg/dL (ref 0–40)

## 2014-11-13 LAB — POCT URINALYSIS DIPSTICK
Bilirubin, UA: NEGATIVE
Blood, UA: NEGATIVE
Glucose, UA: NEGATIVE
KETONES UA: NEGATIVE
Leukocytes, UA: NEGATIVE
Nitrite, UA: NEGATIVE
Protein, UA: NEGATIVE
SPEC GRAV UA: 1.025
UROBILINOGEN UA: NEGATIVE
pH, UA: 6

## 2014-11-13 LAB — CBC WITH DIFFERENTIAL/PLATELET
BASOS ABS: 0.1 10*3/uL (ref 0.0–0.1)
Basophils Relative: 1 % (ref 0–1)
EOS PCT: 3 % (ref 0–5)
Eosinophils Absolute: 0.2 10*3/uL (ref 0.0–0.7)
HCT: 45.1 % (ref 39.0–52.0)
Hemoglobin: 15.4 g/dL (ref 13.0–17.0)
Lymphocytes Relative: 40 % (ref 12–46)
Lymphs Abs: 2.3 10*3/uL (ref 0.7–4.0)
MCH: 30.9 pg (ref 26.0–34.0)
MCHC: 34.1 g/dL (ref 30.0–36.0)
MCV: 90.4 fL (ref 78.0–100.0)
MONO ABS: 0.7 10*3/uL (ref 0.1–1.0)
MONOS PCT: 13 % — AB (ref 3–12)
MPV: 9.1 fL (ref 8.6–12.4)
NEUTROS ABS: 2.5 10*3/uL (ref 1.7–7.7)
Neutrophils Relative %: 43 % (ref 43–77)
PLATELETS: 195 10*3/uL (ref 150–400)
RBC: 4.99 MIL/uL (ref 4.22–5.81)
RDW: 14 % (ref 11.5–15.5)
WBC: 5.7 10*3/uL (ref 4.0–10.5)

## 2014-11-13 LAB — COMPREHENSIVE METABOLIC PANEL
ALBUMIN: 4.1 g/dL (ref 3.5–5.2)
ALT: 19 U/L (ref 0–53)
AST: 21 U/L (ref 0–37)
Alkaline Phosphatase: 74 U/L (ref 39–117)
BUN: 16 mg/dL (ref 6–23)
CALCIUM: 9 mg/dL (ref 8.4–10.5)
CHLORIDE: 104 meq/L (ref 96–112)
CO2: 27 mEq/L (ref 19–32)
Creat: 0.96 mg/dL (ref 0.50–1.35)
GLUCOSE: 89 mg/dL (ref 70–99)
Potassium: 4.4 mEq/L (ref 3.5–5.3)
Sodium: 140 mEq/L (ref 135–145)
Total Bilirubin: 0.6 mg/dL (ref 0.2–1.2)
Total Protein: 6.6 g/dL (ref 6.0–8.3)

## 2014-11-13 MED ORDER — SILDENAFIL CITRATE 50 MG PO TABS: 50.0000 mg | ORAL_TABLET | Freq: Every day | ORAL | Status: DC | PRN

## 2014-11-13 MED ORDER — METOPROLOL SUCCINATE ER 50 MG PO TB24: ORAL_TABLET | ORAL | Status: DC

## 2014-11-13 NOTE — Progress Notes (Signed)
   Subjective:    Patient ID: Andre Martinez, male    DOB: Feb 12, 1956, 59 y.o.   MRN: [id number]  HPI He is here for complete examination. He is now retired and enjoying his retirement. He does keep himself physically busy and also been doing some intellectual endeavors as well. He does have reflux disease and is interested in switching to Zantac. He will be using this on an as-needed basis. He is to use on his blood pressure medication. He does have some arthritis type symptoms. He also has been having difficulty with erections. He came get but cannot maintain an erection. This seems to be intermittent in nature. He otherwise has no concerns or complaints. His health maintenance and immunizations were updated.   Review of Systems  All other systems reviewed and are negative.      Objective:   Physical Exam BP 128/90 mmHg  Pulse 70  Ht 6\' 2"  (1.88 m)  Wt 197 lb (89.359 kg)  BMI 25.28 kg/m2  SpO2 98%  General Appearance:    Alert, cooperative, no distress, appears stated age  Head:    Normocephalic, without obvious abnormality, atraumatic  Eyes:    PERRL, conjunctiva/corneas clear, EOM's intact, fundi    benign  Ears:    Normal TM's and external ear canals  Nose:   Nares normal, mucosa normal, no drainage or sinus   tenderness  Throat:   Lips, mucosa, and tongue normal; teeth and gums normal  Neck:   Supple, no lymphadenopathy;  thyroid:  no   enlargement/tenderness/nodules; no carotid   bruit or JVD  Back:    Spine nontender, no curvature, ROM normal, no CVA     tenderness  Lungs:     Clear to auscultation bilaterally without wheezes, rales or     ronchi; respirations unlabored  Chest Wall:    No tenderness or deformity   Heart:    Regular rate and rhythm, S1 and S2 normal, no murmur, rub   or gallop  Breast Exam:    No chest wall tenderness, masses or gynecomastia  Abdomen:     Soft, non-tender, nondistended, normoactive bowel sounds,    no masses, no hepatosplenomegaly          Extremities:   No clubbing, cyanosis or edema  Pulses:   2+ and symmetric all extremities  Skin:   Skin color, texture, turgor normal, no rashes or lesions  Lymph nodes:   Cervical, supraclavicular, and axillary nodes normal  Neurologic:   CNII-XII intact, normal strength, sensation and gait; reflexes 2+ and symmetric throughout          Psych:   Normal mood, affect, hygiene and grooming.          Assessment & Plan:  Routine general medical examination at a health care facility - Plan: POCT Urinalysis Dipstick, CBC with Differential/Platelet, Comprehensive metabolic panel, Lipid panel  Gastroesophageal reflux disease without esophagitis  Essential hypertension - Plan: metoprolol succinate (TOPROL-XL) 50 MG 24 hr tablet  Arthritis  Allergic rhinitis due to pollen  Other male erectile dysfunction - Plan: sildenafil (VIAGRA) 50 MG tablet Scuffs treatment of his arthritis initially with Tylenol and then using an anti-inflammatory. Also encouraged him to try Zantac for his reflux symptoms. A sample of Viagra with instructions on proper use and side effects was given.

## 2014-11-13 NOTE — Patient Instructions (Addendum)
Switched to Zantac and if that doesn't work the instilled double it but use it as needed. You can also try Axid or Pepcid if the Zantac doesn't work. Arthritis first try Tylenol and if that is not enough then you can add Profen up to 12 pills per day    Liberal redneck

## 2014-12-11 ENCOUNTER — Telehealth: Payer: Self-pay | Admitting: Family Medicine

## 2014-12-11 NOTE — Telephone Encounter (Signed)
Pt asked that labs be mailed to him.  This was done

## 2015-06-02 ENCOUNTER — Encounter: Payer: Self-pay | Admitting: Family Medicine

## 2015-06-02 ENCOUNTER — Ambulatory Visit (INDEPENDENT_AMBULATORY_CARE_PROVIDER_SITE_OTHER): Payer: 59 | Admitting: Family Medicine

## 2015-06-02 VITALS — BP 146/100 | HR 72 | Temp 98.2°F | Ht 74.0 in | Wt 199.0 lb

## 2015-06-02 DIAGNOSIS — M1990 Unspecified osteoarthritis, unspecified site: Secondary | ICD-10-CM

## 2015-06-02 DIAGNOSIS — M25461 Effusion, right knee: Secondary | ICD-10-CM

## 2015-06-02 DIAGNOSIS — M25462 Effusion, left knee: Secondary | ICD-10-CM

## 2015-06-02 DIAGNOSIS — M25562 Pain in left knee: Secondary | ICD-10-CM

## 2015-06-02 DIAGNOSIS — K219 Gastro-esophageal reflux disease without esophagitis: Secondary | ICD-10-CM | POA: Diagnosis not present

## 2015-06-02 DIAGNOSIS — N400 Enlarged prostate without lower urinary tract symptoms: Secondary | ICD-10-CM

## 2015-06-02 DIAGNOSIS — M25561 Pain in right knee: Secondary | ICD-10-CM | POA: Diagnosis not present

## 2015-06-02 NOTE — Progress Notes (Signed)
   Subjective:    Patient ID: Andre Martinez, male    DOB: [birth year], 60 y.o.   MRN: [id number]  HPI He is here for evaluation of multiple issues. He initially came in because of difficulty with knee pain, left greater than right. He has had several year history of pain continue to remain fairly active. Within the last 2 weeks he has noted some posterior left knee pain and difficulty with fully extending his knee but he was able to extend with no popping locking or grinding. He also states that the right knee is causing difficulty but less so and has noticed some swelling in both of them. He then mentioned the fact that he has noted decreased urinary stream and some questionable hesitancy but no urgency, frequency or incomplete emptying. He also has a history of reflux. He is taking an unknown medication over-the-counter and does have concerns over risk factors. He apparently is taking this twice per day.   Review of Systems     Objective:   Physical Exam Exam of both knees does show an effusion present. Anterior drawer, McMurray's testing as well as medial and lateral collateral ligament and anterior drawer was all negative bilaterally.       Assessment & Plan:  Bilateral knee pain - Plan: DG Knee Complete 4 Views Left, DG Knee Complete 4 Views Right  Bilateral knee effusions - Plan: DG Knee Complete 4 Views Left, DG Knee Complete 4 Views Right  Gastroesophageal reflux disease without esophagitis  Arthritis  BPH (benign prostatic hyperplasia) Discussed the knee pain in regard to probable degenerative changes and the need for an injection. Discussed the use of steroids versus other types of joint lubricants and potential for needing to eventually see orthopedics. He is to email me the name of the medication she is on. I explained that all proton pump inhibitors potentially have side effects If they are taken long-term.Also discussed his BPH symptoms. Explained that medications are  available for use however at this point he is having a minimal difficulty with only decreased stream. He is comfortable with monitoring this. Explained that we could place him on occasion at any time.

## 2015-06-03 ENCOUNTER — Ambulatory Visit
Admission: RE | Admit: 2015-06-03 | Discharge: 2015-06-03 | Disposition: A | Discharge status: Home / Self Care | Payer: Self-pay | Source: Ambulatory Visit | Attending: Family Medicine | Admitting: Family Medicine

## 2015-06-03 DIAGNOSIS — M25462 Effusion, left knee: Secondary | ICD-10-CM

## 2015-06-03 DIAGNOSIS — M25461 Effusion, right knee: Secondary | ICD-10-CM

## 2015-06-03 DIAGNOSIS — M25561 Pain in right knee: Secondary | ICD-10-CM

## 2015-06-03 DIAGNOSIS — M25562 Pain in left knee: Principal | ICD-10-CM

## 2015-06-05 ENCOUNTER — Encounter: Payer: Self-pay | Admitting: Family Medicine

## 2015-06-08 ENCOUNTER — Ambulatory Visit (INDEPENDENT_AMBULATORY_CARE_PROVIDER_SITE_OTHER): Payer: 59 | Admitting: Family Medicine

## 2015-06-08 ENCOUNTER — Encounter: Payer: Self-pay | Admitting: Family Medicine

## 2015-06-08 VITALS — BP 130/76 | HR 70 | Ht 74.0 in | Wt 197.2 lb

## 2015-06-08 DIAGNOSIS — M25562 Pain in left knee: Secondary | ICD-10-CM | POA: Diagnosis not present

## 2015-06-08 DIAGNOSIS — M25561 Pain in right knee: Secondary | ICD-10-CM

## 2015-06-08 MED ORDER — LIDOCAINE HCL 2 % IJ SOLN: Freq: Once | INTRAVENOUS | Status: DC

## 2015-06-08 MED ORDER — LIDOCAINE HCL 2 % IJ SOLN
10.0000 mL | Freq: Once | INTRAMUSCULAR | Status: AC
  Administered 2015-06-08: 200 mg via INTRADERMAL

## 2015-06-08 MED ORDER — TRIAMCINOLONE ACETONIDE 40 MG/ML IJ SUSP
40.0000 mg | Freq: Once | INTRAMUSCULAR | Status: AC
  Administered 2015-06-08: 40 mg via INTRAMUSCULAR

## 2015-06-08 NOTE — Progress Notes (Signed)
   Subjective:    Patient ID: Andre Martinez, male    DOB: Apr 13, 1956, 60 y.o.   MRN: [id number]  HPI He is here for consult concerning bilateral knee pain. Recent x-rays did show evidence of degenerative changes especially on the left He is experiencing left greater than right knee pain with physical activity but no popping or locking or grinding.   Review of Systems     Objective:   Physical Exam Alert and in no distress. No joint effusion is noted.       Assessment & Plan:  Arthralgia of both knees - Plan: triamcinolone acetonide (KENALOG-40) injection 40 mg, triamcinolone acetonide (KENALOG-40) injection 40 mg, lidocaine (XYLOCAINE) 2 % (with pres) injection 200 mg, lidocaine (XYLOCAINE) 2 % (with pres) injection 200 mg, DISCONTINUED: lidocaine 2 % (25 mL) w/EPINEPHrine 1:1000 (1 mg) in lactated ringers (1000 mL) tumescent solution optime, DISCONTINUED: lidocaine 2 % (25 mL) w/EPINEPHrine 1:1000 (1 mg) in lactated ringers (1000 mL) tumescent solution optime He does have evidence of bilateral knee arthritis especially the medial compartment left greater than right. I explained the natural history of this ending up having at least a partial knee replacement. Use of steroids and other joint medications to help prolong the potential need for surgery. He understands this and would like to go ahead with shots. The knees were prepped laterally with Betadine. Joint line was identified. 40 mg of Kenalog and 3 mL of Xylocaine was injected in each knee laterally without difficulty. He did obtain relief of his pain relatively quickly. He will keep in touch with me on this however I did explain that the shorter the term of benefit to more likely I will beach refer on.

## 2015-06-19 ENCOUNTER — Telehealth: Payer: Self-pay | Admitting: Family Medicine

## 2015-06-19 NOTE — Telephone Encounter (Signed)
He states he is getting mixed results with the injection with 1 knee doing better than the other  Recommend he give it another couple weeks and let me know how he is doing

## 2015-06-19 NOTE — Telephone Encounter (Signed)
Pt says the he recvd injections in his knees recently and one knee is not doing well. He wanted to let Dr Redmond School know about this and discuss this further

## 2015-08-07 ENCOUNTER — Telehealth: Payer: Self-pay | Admitting: Family Medicine

## 2015-08-07 DIAGNOSIS — Z0279 Encounter for issue of other medical certificate: Secondary | ICD-10-CM

## 2015-08-07 NOTE — Telephone Encounter (Signed)
Received signed medical records request. Records faxed to [facility] on behave of [address] at [phone]

## 2015-11-20 ENCOUNTER — Ambulatory Visit (INDEPENDENT_AMBULATORY_CARE_PROVIDER_SITE_OTHER): Payer: 59 | Admitting: Family Medicine

## 2015-11-20 ENCOUNTER — Encounter: Payer: Self-pay | Admitting: Family Medicine

## 2015-11-20 VITALS — BP 136/88 | HR 64 | Ht 74.0 in | Wt 195.2 lb

## 2015-11-20 DIAGNOSIS — Z1159 Encounter for screening for other viral diseases: Secondary | ICD-10-CM | POA: Diagnosis not present

## 2015-11-20 DIAGNOSIS — K219 Gastro-esophageal reflux disease without esophagitis: Secondary | ICD-10-CM

## 2015-11-20 DIAGNOSIS — Z0000 Encounter for general adult medical examination without abnormal findings: Secondary | ICD-10-CM

## 2015-11-20 DIAGNOSIS — I10 Essential (primary) hypertension: Secondary | ICD-10-CM

## 2015-11-20 DIAGNOSIS — M1990 Unspecified osteoarthritis, unspecified site: Secondary | ICD-10-CM

## 2015-11-20 DIAGNOSIS — S39011A Strain of muscle, fascia and tendon of abdomen, initial encounter: Secondary | ICD-10-CM

## 2015-11-20 DIAGNOSIS — J301 Allergic rhinitis due to pollen: Secondary | ICD-10-CM

## 2015-11-20 DIAGNOSIS — M25562 Pain in left knee: Secondary | ICD-10-CM | POA: Diagnosis not present

## 2015-11-20 LAB — CBC WITH DIFFERENTIAL/PLATELET
Basophils Absolute: 0 cells/uL (ref 0–200)
Basophils Relative: 0 %
EOS PCT: 2 %
Eosinophils Absolute: 116 cells/uL (ref 15–500)
HCT: 47.1 % (ref 38.5–50.0)
Hemoglobin: 15.8 g/dL (ref 13.2–17.1)
LYMPHS ABS: 2320 {cells}/uL (ref 850–3900)
LYMPHS PCT: 40 %
MCH: 30.6 pg (ref 27.0–33.0)
MCHC: 33.5 g/dL (ref 32.0–36.0)
MCV: 91.3 fL (ref 80.0–100.0)
MONOS PCT: 7 %
MPV: 9.4 fL (ref 7.5–12.5)
Monocytes Absolute: 406 cells/uL (ref 200–950)
NEUTROS PCT: 51 %
Neutro Abs: 2958 cells/uL (ref 1500–7800)
PLATELETS: 192 10*3/uL (ref 140–400)
RBC: 5.16 MIL/uL (ref 4.20–5.80)
RDW: 13.5 % (ref 11.0–15.0)
WBC: 5.8 10*3/uL (ref 4.0–10.5)

## 2015-11-20 LAB — POCT URINALYSIS DIPSTICK
Bilirubin, UA: NEGATIVE
Blood, UA: NEGATIVE
Glucose, UA: NEGATIVE
Ketones, UA: NEGATIVE
Leukocytes, UA: NEGATIVE
Nitrite, UA: NEGATIVE
PH UA: 5
PROTEIN UA: NEGATIVE
UROBILINOGEN UA: NEGATIVE

## 2015-11-20 MED ORDER — TRIAMCINOLONE ACETONIDE 40 MG/ML IJ SUSP
40.0000 mg | Freq: Once | INTRAMUSCULAR | Status: AC
  Administered 2015-11-20: 40 mg via INTRAMUSCULAR

## 2015-11-20 MED ORDER — METOPROLOL SUCCINATE ER 50 MG PO TB24: ORAL_TABLET | ORAL | Status: DC

## 2015-11-20 MED ORDER — LIDOCAINE HCL (PF) 2 % IJ SOLN
2.0000 mL | Freq: Once | INTRAMUSCULAR | Status: AC
  Administered 2015-11-20: 2 mL via INTRADERMAL

## 2015-11-20 NOTE — Progress Notes (Signed)
Subjective:    Patient ID: Andre Martinez, male    DOB: [birth year], 60 y.o.   MRN: [id number]  HPI He is here for complete examination. He has had a three-week history of lower abdominal discomfort especially when he lifts his leg or doses abdominal muscles. He notes prior to this and he did do a lot of lifting. He also has had more difficulty with left knee discomfort. He had both of his knees injected in the past however the left one is still giving him some difficulty. He would like another injection. He does have a history of reflux and has seen Dr. Amedeo Plenty in the past for this. The endoscopy was essentially negative. He continues on Toprol for his hypertension. His allergies are under good control. He is now retired and enjoying his retirement. Family and social history as well as health maintenance and immunizations were reviewed. He has no other concerns or complaints. No urinary or GI symptoms.   Review of Systems  All other systems reviewed and are negative.      Objective:   Physical Exam BP 136/88 mmHg  Pulse 64  Ht 6\' 2"  (1.88 m)  Wt 195 lb 3.2 oz (88.542 kg)  BMI 25.05 kg/m2  General Appearance:    Alert, cooperative, no distress, appears stated age  Head:    Normocephalic, without obvious abnormality, atraumatic  Eyes:    PERRL, conjunctiva/corneas clear, EOM's intact, fundi    benign  Ears:    Normal TM's and external ear canals  Nose:   Nares normal, mucosa normal, no drainage or sinus   tenderness  Throat:   Lips, mucosa, and tongue normal; teeth and gums normal  Neck:   Supple, no lymphadenopathy;  thyroid:  no   enlargement/tenderness/nodules; no carotid   bruit or JVD  Back:    Spine nontender, no curvature, ROM normal, no CVA     tenderness  Lungs:     Clear to auscultation bilaterally without wheezes, rales or     ronchi; respirations unlabored  Chest Wall:    No tenderness or deformity   Heart:    Regular rate and rhythm, S1 and S2 normal, no murmur, rub  or gallop  Breast Exam:    No chest wall tenderness, masses or gynecomastia  Abdomen:     Soft, non-tender, nondistended, normoactive bowel sounds,    no masses, no hepatosplenomegaly  Genitalia:    Normal male external genitalia without lesions.  Testicles without masses.  No inguinal hernias.     Extremities:   No clubbing, cyanosis or edema  Pulses:   2+ and symmetric all extremities  Skin:   Skin color, texture, turgor normal, no rashes or lesions  Lymph nodes:   Cervical, supraclavicular, and axillary nodes normal  Neurologic:   CNII-XII intact, normal strength, sensation and gait; reflexes 2+ and symmetric throughout          Psych:   Normal mood, affect, hygiene and grooming.          Assessment & Plan:  Routine general medical examination at a health care facility - Plan: POCT Urinalysis Dipstick, CBC with Differential/Platelet, Comprehensive metabolic panel, Lipid panel  Gastroesophageal reflux disease without esophagitis  Essential hypertension - Plan: metoprolol succinate (TOPROL-XL) 50 MG 24 hr tablet  Arthritis  Allergic rhinitis due to pollen  Need for hepatitis C screening test - Plan: Hepatitis C antibody  Abdominal muscle strain, initial encounter The left knee was prepped with Betadine. The joint line  was identified. 40 mg of Kenalog and 3 mL of Xylocaine was injected into the joint on the lateral side without difficulty. He did obtain relief of his discomfort. He will continue on Toprol. Reassured him that the abdominal strain is nothing to be concerned about since he is slowly getting better. Continue treat his allergies with OTC meds.

## 2015-11-21 LAB — COMPREHENSIVE METABOLIC PANEL
ALBUMIN: 4.4 g/dL (ref 3.6–5.1)
ALT: 18 U/L (ref 9–46)
AST: 20 U/L (ref 10–35)
Alkaline Phosphatase: 72 U/L (ref 40–115)
BILIRUBIN TOTAL: 0.5 mg/dL (ref 0.2–1.2)
BUN: 22 mg/dL (ref 7–25)
CO2: 20 mmol/L (ref 20–31)
CREATININE: 0.94 mg/dL (ref 0.70–1.25)
Calcium: 9.5 mg/dL (ref 8.6–10.3)
Chloride: 106 mmol/L (ref 98–110)
GLUCOSE: 89 mg/dL (ref 65–99)
Potassium: 4.5 mmol/L (ref 3.5–5.3)
SODIUM: 140 mmol/L (ref 135–146)
Total Protein: 6.7 g/dL (ref 6.1–8.1)

## 2015-11-21 LAB — LIPID PANEL
Cholesterol: 217 mg/dL — ABNORMAL HIGH (ref 125–200)
HDL: 62 mg/dL (ref >=40)
LDL CALC: 132 mg/dL — AB (ref <130)
Total CHOL/HDL Ratio: 3.5 Ratio (ref <=5.0)
Triglycerides: 117 mg/dL (ref <150)
VLDL: 23 mg/dL (ref <30)

## 2015-11-21 LAB — HEPATITIS C ANTIBODY: HCV Ab: NEGATIVE

## 2016-02-12 ENCOUNTER — Encounter: Payer: Self-pay | Admitting: Family Medicine

## 2016-02-12 ENCOUNTER — Ambulatory Visit (INDEPENDENT_AMBULATORY_CARE_PROVIDER_SITE_OTHER): Payer: 59 | Admitting: Family Medicine

## 2016-02-12 VITALS — BP 120/74 | HR 85 | Temp 100.9°F | Wt 203.0 lb

## 2016-02-12 DIAGNOSIS — J029 Acute pharyngitis, unspecified: Secondary | ICD-10-CM

## 2016-02-12 DIAGNOSIS — J0140 Acute pansinusitis, unspecified: Secondary | ICD-10-CM

## 2016-02-12 DIAGNOSIS — R6889 Other general symptoms and signs: Secondary | ICD-10-CM | POA: Diagnosis not present

## 2016-02-12 LAB — POCT RAPID STREP A (OFFICE): RAPID STREP A SCREEN: NEGATIVE

## 2016-02-12 LAB — POC INFLUENZA A&B (BINAX/QUICKVUE)
INFLUENZA A, POC: NEGATIVE
INFLUENZA B, POC: NEGATIVE

## 2016-02-12 MED ORDER — AZITHROMYCIN 250 MG PO TABS
ORAL_TABLET | ORAL | 0 refills | Status: DC
Start: 2016-02-12 — End: 2016-12-02

## 2016-02-12 NOTE — Progress Notes (Signed)
   Subjective:    Patient ID: Andre Martinez, male    DOB: [birth year], 60 y.o.   MRN: [id number]  HPI Chief Complaint  Patient presents with  . headache    dull headache. possible sinus infection, congestion, cough, sore throat   He is here today with complaints of flu-like symptoms that started 3 days ago with sore throat, sinus pressure, rhinorrhea, post nasal drainage, cough, hot flashes and chills. Also reports bilateral headache, dull ache mainly behind both eyes and generalized body aches. States he feels like he has a sinus infection and has had similar symptoms in past.  Denies ear pain, blurred or double vision, chest pain, palpitations, shortness of breath, abdominal pain, vomiting, diarrhea.  Positive sick contacts.  Denies smoking.  Denies history of pneumonia, bronchitis, asthma or COPD.  No recent antibiotic history.  Staying hydrated. Took extra strength tylenol and then advil and it did help with headache but then the headache returned.  Past Medical History:  Diagnosis Date  . Allergy   . Arthritis   . GERD (gastroesophageal reflux disease)   . Hemorrhoids   . Hypertension   . Sinusitis 12/94 3/95      Review of Systems Pertinent positives and negatives in the history of present illness.     Objective:   Physical Exam  Constitutional: He is oriented to person, place, and time. He appears well-developed and well-nourished. No distress.  HENT:  Right Ear: Tympanic membrane and ear canal normal.  Left Ear: Tympanic membrane and ear canal normal.  Nose: Mucosal edema present. No rhinorrhea. Right sinus exhibits frontal sinus tenderness. Right sinus exhibits no maxillary sinus tenderness. Left sinus exhibits frontal sinus tenderness. Left sinus exhibits no maxillary sinus tenderness.  Mouth/Throat: Uvula is midline and mucous membranes are normal. Posterior oropharyngeal erythema present. No oropharyngeal exudate, posterior oropharyngeal edema or tonsillar  abscesses.  Neck: Full passive range of motion without pain. Neck supple.  Cardiovascular: Normal rate, regular rhythm and normal heart sounds.  Exam reveals no gallop and no friction rub.   No murmur heard. Pulmonary/Chest: Effort normal and breath sounds normal.  Lymphadenopathy:    He has no cervical adenopathy.  Neurological: He is alert and oriented to person, place, and time.  Skin: Skin is warm and dry. No rash noted. No pallor.  Psychiatric: He has a normal mood and affect. His speech is normal and behavior is normal. Judgment and thought content normal. Cognition and memory are normal.   BP 120/74   Pulse 85   Temp (!) 100.9 F (38.3 C) (Tympanic)   Wt 203 lb (92.1 kg)   BMI 26.06 kg/m      Assessment & Plan:  Acute pansinusitis, recurrence not specified - Plan: azithromycin (ZITHROMAX) 250 MG tablet  Flu-like symptoms  Acute pharyngitis, unspecified etiology  Discussed that his symptoms appear to be related to a sinus infection. Strep swab is negative and flu swab is negative.  Z-pak prescribed. Discussed symptomatic treatment. He will follow up if not improving and if not back to baseline after finishing antibiotic.

## 2016-03-09 ENCOUNTER — Encounter: Payer: Self-pay | Admitting: Family Medicine

## 2016-05-19 DIAGNOSIS — M25562 Pain in left knee: Secondary | ICD-10-CM | POA: Diagnosis not present

## 2016-05-19 DIAGNOSIS — M170 Bilateral primary osteoarthritis of knee: Secondary | ICD-10-CM | POA: Diagnosis not present

## 2016-05-19 DIAGNOSIS — M25561 Pain in right knee: Secondary | ICD-10-CM | POA: Diagnosis not present

## 2016-05-26 DIAGNOSIS — M25561 Pain in right knee: Secondary | ICD-10-CM | POA: Diagnosis not present

## 2016-05-26 DIAGNOSIS — M25562 Pain in left knee: Secondary | ICD-10-CM | POA: Diagnosis not present

## 2016-05-26 DIAGNOSIS — M170 Bilateral primary osteoarthritis of knee: Secondary | ICD-10-CM | POA: Diagnosis not present

## 2016-06-03 DIAGNOSIS — M25562 Pain in left knee: Secondary | ICD-10-CM | POA: Diagnosis not present

## 2016-06-03 DIAGNOSIS — M170 Bilateral primary osteoarthritis of knee: Secondary | ICD-10-CM | POA: Diagnosis not present

## 2016-06-03 DIAGNOSIS — M25561 Pain in right knee: Secondary | ICD-10-CM | POA: Diagnosis not present

## 2016-09-08 DIAGNOSIS — M170 Bilateral primary osteoarthritis of knee: Secondary | ICD-10-CM | POA: Diagnosis not present

## 2016-09-08 DIAGNOSIS — M25561 Pain in right knee: Secondary | ICD-10-CM | POA: Diagnosis not present

## 2016-09-08 DIAGNOSIS — M25562 Pain in left knee: Secondary | ICD-10-CM | POA: Diagnosis not present

## 2016-11-23 ENCOUNTER — Other Ambulatory Visit: Payer: Self-pay | Admitting: Family Medicine

## 2016-11-23 DIAGNOSIS — I10 Essential (primary) hypertension: Secondary | ICD-10-CM

## 2016-12-02 ENCOUNTER — Ambulatory Visit (INDEPENDENT_AMBULATORY_CARE_PROVIDER_SITE_OTHER): Payer: 59 | Admitting: Family Medicine

## 2016-12-02 ENCOUNTER — Encounter: Payer: Self-pay | Admitting: Family Medicine

## 2016-12-02 VITALS — BP 120/80 | HR 60 | Ht 73.5 in | Wt 193.6 lb

## 2016-12-02 DIAGNOSIS — Z0000 Encounter for general adult medical examination without abnormal findings: Secondary | ICD-10-CM

## 2016-12-02 DIAGNOSIS — K219 Gastro-esophageal reflux disease without esophagitis: Secondary | ICD-10-CM

## 2016-12-02 DIAGNOSIS — I10 Essential (primary) hypertension: Secondary | ICD-10-CM | POA: Diagnosis not present

## 2016-12-02 DIAGNOSIS — M1990 Unspecified osteoarthritis, unspecified site: Secondary | ICD-10-CM | POA: Diagnosis not present

## 2016-12-02 DIAGNOSIS — J301 Allergic rhinitis due to pollen: Secondary | ICD-10-CM

## 2016-12-02 LAB — CBC WITH DIFFERENTIAL/PLATELET
BASOS ABS: 66 {cells}/uL (ref 0–200)
BASOS PCT: 1 %
Eosinophils Absolute: 198 cells/uL (ref 15–500)
Eosinophils Relative: 3 %
HCT: 47 % (ref 38.5–50.0)
Hemoglobin: 16 g/dL (ref 13.2–17.1)
LYMPHS PCT: 36 %
Lymphs Abs: 2376 cells/uL (ref 850–3900)
MCH: 31.1 pg (ref 27.0–33.0)
MCHC: 34 g/dL (ref 32.0–36.0)
MCV: 91.4 fL (ref 80.0–100.0)
MONOS PCT: 10 %
MPV: 9.4 fL (ref 7.5–12.5)
Monocytes Absolute: 660 cells/uL (ref 200–950)
NEUTROS PCT: 50 %
Neutro Abs: 3300 cells/uL (ref 1500–7800)
PLATELETS: 201 10*3/uL (ref 140–400)
RBC: 5.14 MIL/uL (ref 4.20–5.80)
RDW: 13.6 % (ref 11.0–15.0)
WBC: 6.6 10*3/uL (ref 4.0–10.5)

## 2016-12-02 LAB — COMPREHENSIVE METABOLIC PANEL
ALT: 19 U/L (ref 9–46)
AST: 22 U/L (ref 10–35)
Albumin: 4.3 g/dL (ref 3.6–5.1)
Alkaline Phosphatase: 82 U/L (ref 40–115)
BUN: 20 mg/dL (ref 7–25)
CO2: 26 mmol/L (ref 20–31)
Calcium: 9.3 mg/dL (ref 8.6–10.3)
Chloride: 103 mmol/L (ref 98–110)
Creat: 0.96 mg/dL (ref 0.70–1.25)
GLUCOSE: 89 mg/dL (ref 65–99)
POTASSIUM: 5.1 mmol/L (ref 3.5–5.3)
Sodium: 138 mmol/L (ref 135–146)
Total Bilirubin: 0.6 mg/dL (ref 0.2–1.2)
Total Protein: 6.7 g/dL (ref 6.1–8.1)

## 2016-12-02 LAB — LIPID PANEL
CHOLESTEROL: 231 mg/dL — AB (ref <200)
HDL: 61 mg/dL (ref >40)
LDL CALC: 149 mg/dL — AB (ref <100)
Total CHOL/HDL Ratio: 3.8 Ratio (ref <5.0)
Triglycerides: 104 mg/dL (ref <150)
VLDL: 21 mg/dL (ref <30)

## 2016-12-02 LAB — POCT URINALYSIS DIP (PROADVANTAGE DEVICE)
BILIRUBIN UA: NEGATIVE
Glucose, UA: NEGATIVE mg/dL
Ketones, POC UA: NEGATIVE mg/dL
Leukocytes, UA: NEGATIVE
NITRITE UA: NEGATIVE
PH UA: 5.5 (ref 5.0–8.0)
Protein Ur, POC: NEGATIVE mg/dL
RBC UA: NEGATIVE
Specific Gravity, Urine: 1.03
Urobilinogen, Ur: NEGATIVE

## 2016-12-02 MED ORDER — METOPROLOL SUCCINATE ER 50 MG PO TB24: 50.0000 mg | ORAL_TABLET | Freq: Every day | ORAL | 3 refills | Status: DC

## 2016-12-02 NOTE — Progress Notes (Signed)
Subjective:    Patient ID: Andre Martinez, male    DOB: 1956/04/13, 61 y.o.   MRN: [id number]  HPI He is here for complete examination. He recently went through the flexogenics program and apparently did get injections which did help. In the past and given him steroid injections. Review of the record indicates he does have arthritic changes especially medially. He also is continuing to have intermittent right lower quadrant discomfort. It can occur on a monthly basis and last for several days. This is been going on for over a year and has not changed any. I have evaluated that the past and found nothing of significance. His allergies are under good control. He does have reflux symptoms and does use Protonix. He has seen Dr. Amedeo Plenty in the past and nothing in particular was noted other than this is possibly spasm related. He is now retired. He does keep himself physically active. Home life is going well. Family and social history as well as health maintenance and immunizations were reviewed.   Review of Systems  All other systems reviewed and are negative.      Objective:   Physical Exam BP 120/80   Pulse 60   Ht 6' 1.5" (1.867 m)   Wt 193 lb 9.6 oz (87.8 kg)   SpO2 98%   BMI 25.20 kg/m   General Appearance:    Alert, cooperative, no distress, appears stated age  Head:    Normocephalic, without obvious abnormality, atraumatic  Eyes:    PERRL, conjunctiva/corneas clear, EOM's intact, fundi    benign  Ears:    Normal TM's and external ear canals  Nose:   Nares normal, mucosa normal, no drainage or sinus   tenderness  Throat:   Lips, mucosa, and tongue normal; teeth and gums normal  Neck:   Supple, no lymphadenopathy;  thyroid:  no   enlargement/tenderness/nodules; no carotid   bruit or JVD     Lungs:     Clear to auscultation bilaterally without wheezes, rales or     ronchi; respirations unlabored      Heart:    Regular rate and rhythm, S1 and S2 normal, no murmur, rub   or  gallop     Abdomen:     Soft, non-tender, nondistended, normoactive bowel sounds,    no masses, no hepatosplenomegaly  Genitalia:    Normal male external genitalia without lesions.  Testicles without masses.  No inguinal hernias.  Rectal:    Deferred   Extremities:   No clubbing, cyanosis or edema  Pulses:   2+ and symmetric all extremities  Skin:   Skin color, texture, turgor normal, no rashes or lesions  Lymph nodes:   Cervical, supraclavicular, and axillary nodes normal  Neurologic:   CNII-XII intact, normal strength, sensation and gait; reflexes 2+ and symmetric throughout          Psych:   Normal mood, affect, hygiene and grooming.          Assessment & Plan:  Routine general medical examination at a health care facility - Plan: POCT Urinalysis DIP (Proadvantage Device), CBC with Differential/Platelet, Comprehensive metabolic panel, Lipid panel  Essential hypertension - Plan: CBC with Differential/Platelet, Comprehensive metabolic panel, metoprolol succinate (TOPROL-XL) 50 MG 24 hr tablet  Gastroesophageal reflux disease without esophagitis  Arthritis  Allergic rhinitis due to pollen, unspecified seasonality Encouraged him to continue with his active lifestyle. Discussed treatment of arthritis in regard to further injections versus referral for surgery. Discussed the fact that  he could probably get away with a partial arthroplasty since his main concern is medially on x-rays. He will continue on his present medication regimen for the above diagnoses.

## 2016-12-12 DIAGNOSIS — H524 Presbyopia: Secondary | ICD-10-CM | POA: Diagnosis not present

## 2017-01-30 ENCOUNTER — Encounter: Payer: Self-pay | Admitting: Family Medicine

## 2017-02-21 ENCOUNTER — Encounter: Payer: 59 | Admitting: Family Medicine

## 2017-08-14 ENCOUNTER — Telehealth: Payer: Self-pay | Admitting: Family Medicine

## 2017-08-14 NOTE — Telephone Encounter (Signed)
I personally do not think it is worth it.

## 2017-08-14 NOTE — Telephone Encounter (Signed)
Pt was made aware [facility]. 

## 2017-08-14 NOTE — Telephone Encounter (Signed)
Pt has made an appointment to get a [facility] screening. He wants to know Dr Lanice Shirts opinion of this screening. Is it worth it? Is it good testing?

## 2017-09-04 ENCOUNTER — Ambulatory Visit: Payer: 59 | Admitting: Family Medicine

## 2017-09-14 ENCOUNTER — Ambulatory Visit
Admission: RE | Admit: 2017-09-14 | Discharge: 2017-09-14 | Disposition: A | Discharge status: Home / Self Care | Payer: 59 | Source: Ambulatory Visit | Attending: Family Medicine | Admitting: Family Medicine

## 2017-09-14 ENCOUNTER — Ambulatory Visit: Payer: 59 | Admitting: Family Medicine

## 2017-09-14 VITALS — BP 130/80 | HR 57 | Temp 97.6°F | Ht 73.75 in | Wt 199.0 lb

## 2017-09-14 DIAGNOSIS — M1990 Unspecified osteoarthritis, unspecified site: Secondary | ICD-10-CM

## 2017-09-14 DIAGNOSIS — M1712 Unilateral primary osteoarthritis, left knee: Secondary | ICD-10-CM | POA: Diagnosis not present

## 2017-09-14 DIAGNOSIS — I10 Essential (primary) hypertension: Secondary | ICD-10-CM

## 2017-09-14 MED ORDER — LIDOCAINE HCL 2 % IJ SOLN
10.0000 mL | Freq: Once | INTRAMUSCULAR | Status: AC
  Administered 2017-09-14: 200 mg via INTRADERMAL

## 2017-09-14 MED ORDER — METOPROLOL SUCCINATE ER 50 MG PO TB24: 50.0000 mg | ORAL_TABLET | Freq: Every day | ORAL | 3 refills | Status: DC

## 2017-09-14 MED ORDER — TRIAMCINOLONE ACETONIDE 40 MG/ML IJ SUSP
40.0000 mg | Freq: Once | INTRAMUSCULAR | Status: AC
  Administered 2017-09-14: 40 mg via INTRAMUSCULAR

## 2017-09-14 NOTE — Progress Notes (Signed)
   Subjective:    Patient ID: Andre Martinez, male    DOB: 06-30-1955, 62 y.o.   MRN: [id number]  HPI He is here for consult concerning continued difficulty with left knee pain.  He has had an injection by me several years ago for treatment of his arthritis.  He also went to flexogenex approximately 1 year ago and did get a series of 3 shots.  The knee has continued to give him trouble. He also needs his metoprolol renewed. Review of Systems     Objective:   Physical Exam Alert and in no distress.  Exam of the knee shows no effusion.  Minimal lipping is noted.  Negative anterior drawer.  McMurray's testing negative.  Medial and lateral collateral ligaments intact.       Assessment & Plan:  Arthritis - Plan: DG Knee Complete 4 Views Left, triamcinolone acetonide (KENALOG-40) injection 40 mg, lidocaine (XYLOCAINE) 2 % (with pres) injection 200 mg  Essential hypertension - Plan: metoprolol succinate (TOPROL-XL) 50 MG 24 hr tablet The knee was prepped laterally and the joint line identified.  40 mg of Kenalog and 3 cc of Xylocaine was injected without difficulty. I then discussed the fact that he obviously has some degenerative joint disease and eventually will need replacement.  Explained that a steroid shot periodically is not unreasonable but if the timeframe between the shots becomes shorter, that will push the issue to see a surgeon.  We will get follow-up x-rays to see the changes over the last several years.

## 2017-11-17 ENCOUNTER — Telehealth: Payer: Self-pay

## 2017-11-17 NOTE — Telephone Encounter (Signed)
Go ahead and make the referral 

## 2017-11-17 NOTE — Telephone Encounter (Signed)
Patient is wanting a referral for orthopedic surgeon for knee replacement.  Patient states that you are aware of the situation and wants you to give your opinion.  Thank you

## 2017-11-20 ENCOUNTER — Other Ambulatory Visit: Payer: Self-pay

## 2017-11-20 DIAGNOSIS — G8929 Other chronic pain: Secondary | ICD-10-CM

## 2017-11-20 DIAGNOSIS — M25562 Pain in left knee: Principal | ICD-10-CM

## 2017-11-20 NOTE — Telephone Encounter (Signed)
Referral put in [facility]

## 2017-11-29 ENCOUNTER — Ambulatory Visit (INDEPENDENT_AMBULATORY_CARE_PROVIDER_SITE_OTHER): Payer: 59 | Admitting: Orthopedic Surgery

## 2017-12-25 DIAGNOSIS — H524 Presbyopia: Secondary | ICD-10-CM | POA: Diagnosis not present

## 2018-01-18 DIAGNOSIS — M25562 Pain in left knee: Secondary | ICD-10-CM | POA: Diagnosis not present

## 2018-02-08 DIAGNOSIS — Z23 Encounter for immunization: Secondary | ICD-10-CM | POA: Diagnosis not present

## 2018-02-19 ENCOUNTER — Telehealth: Payer: Self-pay

## 2018-02-19 NOTE — Telephone Encounter (Signed)
Spoke to pt about form from [facility] and pt has CPE 03-06-18 and form is in brown folder. [place]

## 2018-03-06 ENCOUNTER — Encounter: Payer: Self-pay | Admitting: Family Medicine

## 2018-03-06 ENCOUNTER — Ambulatory Visit: Payer: 59 | Admitting: Family Medicine

## 2018-03-06 VITALS — BP 120/82 | HR 63 | Temp 97.4°F | Ht 73.0 in | Wt 201.8 lb

## 2018-03-06 DIAGNOSIS — M25562 Pain in left knee: Secondary | ICD-10-CM | POA: Diagnosis not present

## 2018-03-06 DIAGNOSIS — E785 Hyperlipidemia, unspecified: Secondary | ICD-10-CM

## 2018-03-06 DIAGNOSIS — M1990 Unspecified osteoarthritis, unspecified site: Secondary | ICD-10-CM | POA: Diagnosis not present

## 2018-03-06 DIAGNOSIS — G8929 Other chronic pain: Secondary | ICD-10-CM

## 2018-03-06 DIAGNOSIS — L989 Disorder of the skin and subcutaneous tissue, unspecified: Secondary | ICD-10-CM

## 2018-03-06 DIAGNOSIS — Z0000 Encounter for general adult medical examination without abnormal findings: Secondary | ICD-10-CM | POA: Diagnosis not present

## 2018-03-06 DIAGNOSIS — Z23 Encounter for immunization: Secondary | ICD-10-CM

## 2018-03-06 DIAGNOSIS — I499 Cardiac arrhythmia, unspecified: Secondary | ICD-10-CM

## 2018-03-06 DIAGNOSIS — K219 Gastro-esophageal reflux disease without esophagitis: Secondary | ICD-10-CM

## 2018-03-06 LAB — POCT URINALYSIS DIP (PROADVANTAGE DEVICE)
BILIRUBIN UA: NEGATIVE
BILIRUBIN UA: NEGATIVE mg/dL
GLUCOSE UA: NEGATIVE mg/dL
NITRITE UA: NEGATIVE
PH UA: 6 (ref 5.0–8.0)
Protein Ur, POC: NEGATIVE mg/dL
SPECIFIC GRAVITY, URINE: 1.025
UUROB: 3.5

## 2018-03-06 LAB — LIPID PANEL
CHOLESTEROL TOTAL: 203 mg/dL — AB (ref 100–199)
Chol/HDL Ratio: 3.7 ratio (ref 0.0–5.0)
HDL: 55 mg/dL (ref >39)
LDL CALC: 109 mg/dL — AB (ref 0–99)
TRIGLYCERIDES: 193 mg/dL — AB (ref 0–149)
VLDL CHOLESTEROL CAL: 39 mg/dL (ref 5–40)

## 2018-03-06 NOTE — Addendum Note (Signed)
Addended by: Elyse Jarvis on: 03/06/2018 11:03 AM   Modules accepted: Orders

## 2018-03-06 NOTE — Progress Notes (Signed)
Subjective:    Patient ID: Andre Martinez, male    DOB: [birth year], 62 y.o.   MRN: [id number]  HPI He is here for complete examination.  He is getting ready to have total knee replacement on the left in January.  Does have a history of an unknown arrhythmia and was placed on metoprolol for that.  This was over 10 years ago and since then he has had no difficulties.  Does have underlying allergies and is having no difficulty with that.  Does have a left facial lesion that he says has been there for years but is now getting worse he is taking Pepcid for intermittent reflux symptoms.  He does not smoke.  Drinking is minimal.  Family and social history as well as health maintenance and immunizations was reviewed.   Review of Systems  All other systems reviewed and are negative.      Objective:   Physical Exam BP 120/82 (BP Location: Left Arm, Patient Position: Sitting)   Pulse 63   Temp (!) 97.4 F (36.3 C)   Ht 6\' 1"  (1.854 m)   Wt 201 lb 12.8 oz (91.5 kg)   SpO2 95%   BMI 26.62 kg/m   General Appearance:    Alert, cooperative, no distress, appears stated age  Head:    Normocephalic, without obvious abnormality, atraumatic  Eyes:    PERRL, conjunctiva/corneas clear, EOM's intact, fundi    benign  Ears:    Normal TM's and external ear canals  Nose:   Nares normal, mucosa normal, no drainage or sinus   tenderness  Throat:   Lips, mucosa, and tongue normal; teeth and gums normal  Neck:   Supple, no lymphadenopathy;  thyroid:  no   enlargement/tenderness/nodules; no carotid   bruit or JVD     Lungs:     Clear to auscultation bilaterally without wheezes, rales or     ronchi; respirations unlabored      Heart:    Regular rate and rhythm, S1 and S2 normal, no murmur, rub   or gallop     Abdomen:     Soft, non-tender, nondistended, normoactive bowel sounds,    no masses, no hepatosplenomegaly  Genitalia:  deferred  Rectal:  deferred  Extremities:   No clubbing, cyanosis or edema   Pulses:   2+ and symmetric all extremities  Skin:   Skin color, texture, turgor normal, erythematous linear lesion noted on the left cheek  Lymph nodes:   Cervical, supraclavicular, and axillary nodes normal  Neurologic:   CNII-XII intact, normal strength, sensation and gait; reflexes 2+ and symmetric throughout          Psych:   Normal mood, affect, hygiene and grooming. EKG is normal.  Urine microscopic was negative      Assessment & Plan:  Routine general medical examination at a health care facility  Chronic pain of left knee  Arthritis  Cardiac arrhythmia, unspecified cardiac arrhythmia type - Plan: EKG 12-Lead  Gastroesophageal reflux disease without esophagitis  Need for shingles vaccine - Plan: Varicella-zoster vaccine IM  Hyperlipidemia, unspecified hyperlipidemia type - Plan: Lipid panel  Facial lesion - Plan: Ambulatory referral to Dermatology He is cleared for surgery I suspect that the arrhythmia he had several years ago was probably benign possibly PAC.  I discussed this with him and we may possibly readdress this after he has had surgery and potentially stop the metoprolol. Continue to use Pepcid as needed. I will keep an eye on his  lipids.

## 2018-04-19 ENCOUNTER — Encounter: Payer: Self-pay | Admitting: Family Medicine

## 2018-05-01 NOTE — H&P (Signed)
TOTAL KNEE ADMISSION H&P  Patient is being admitted for left total knee arthroplasty.  Subjective:  Chief Complaint:left knee pain.  HPI: Andre Martinez, 62 y.o. male, has a history of pain and functional disability in the left knee due to arthritis and has failed non-surgical conservative treatments for greater than 12 weeks to includecorticosteriod injections, viscosupplementation injections and activity modification.  Onset of symptoms was gradual, starting 3 years ago with gradually worsening course since that time. The patient noted no past surgery on the left knee(s).  Patient currently rates pain in the left knee(s) at 8 out of 10 with activity. Patient has night pain, worsening of pain with activity and weight bearing and pain that interferes with activities of daily living.  Patient has evidence of bone-on-bone osteoarthritis of the medial and patellofemoral compartments with minimal varus deformity and large osteophyte formation by imaging studies. There is no active infection.  Patient Active Problem List   Diagnosis Date Noted  . Hyperlipidemia 03/06/2018  . Allergic rhinitis 10/29/2012  . GERD (gastroesophageal reflux disease) 10/18/2011  . Arrhythmia 10/18/2011  . Arthritis 10/18/2011   Past Medical History:  Diagnosis Date  . Allergy   . Arthritis   . GERD (gastroesophageal reflux disease)   . Hemorrhoids   . Hypertension   . Sinusitis 12/94 3/95    Past Surgical History:  Procedure Laterality Date  . COLONOSCOPY  2006   DR.HAYES  . rectal fissure     REPAIR  . UPPER GI ENDOSCOPY  04/08/13   normal esophagus,multiple gastric polyps,normal examined duodenum    No current facility-administered medications for this encounter.    Current Outpatient Medications  Medication Sig Dispense Refill Last Dose  . aspirin 81 MG tablet Take 81 mg by mouth daily.     Not Taking  . cholecalciferol (VITAMIN D) 1000 units tablet Take 1,000 Units by mouth daily.   Not Taking  .  famotidine (PEPCID) 20 MG tablet Take 20 mg by mouth daily.   Taking  . metoprolol succinate (TOPROL-XL) 50 MG 24 hr tablet Take 1 tablet (50 mg total) by mouth daily. Take with or immediately following a meal. 90 tablet 3 Taking  . ranitidine (ZANTAC) 150 MG capsule Take 150 mg by mouth 2 (two) times daily.   Not Taking   Allergies  Allergen Reactions  . Codeine Nausea Only  . Penicillins Rash    Social History   Tobacco Use  . Smoking status: Former Smoker    Years: 0.50    Types: Cigars  . Smokeless tobacco: Never Used  Substance Use Topics  . Alcohol use: Yes    Alcohol/week: 3.0 standard drinks    Types: 3 Cans of beer per week    Family History  Problem Relation Age of Onset  . Hypertension Mother   . Diabetes Mother   . Heart disease Father   . Hypertension Father   . Heart disease Paternal Grandfather      Review of Systems  Constitutional: Negative for chills and fever.  HENT: Negative for congestion, sore throat and tinnitus.   Eyes: Negative for double vision, photophobia and pain.  Respiratory: Negative for cough, shortness of breath and wheezing.   Cardiovascular: Negative for chest pain, palpitations and orthopnea.  Gastrointestinal: Negative for heartburn, nausea and vomiting.  Genitourinary: Negative for dysuria, frequency and urgency.  Musculoskeletal: Positive for joint pain.  Neurological: Negative for dizziness, weakness and headaches.    Objective:  Physical Exam  Well nourished and well  developed.  General: Alert and oriented x3, cooperative and pleasant, no acute distress.  Head: normocephalic, atraumatic, neck supple.  Eyes: EOMI.  Respiratory: breath sounds clear in all fields, no wheezing, rales, or rhonchi. Cardiovascular: Regular rate and rhythm, no murmurs, gallops or rubs.  Abdomen: non-tender to palpation and soft, normoactive bowel sounds. Musculoskeletal: Left Knee Exam: No effusion. No Swelling.Range of motion is 5-125 degrees.  Moderate crepitus on range of motion of the knee. No medial joint line tenderness. Significant lateral joint line tenderness. Stable knee. Calves soft and nontender. Motor function intact in LE. Strength 5/5 LE bilaterally. Neuro: Distal pulses 2+. Sensation to light touch intact in LE.  Vital signs in last 24 hours: Blood pressure: 132/88 mmHg Pulse: 68 bpm  Labs:   Estimated body mass index is 26.62 kg/m as calculated from the following:   Height as of 03/06/18: 6\' 1"  (1.854 m).   Weight as of 03/06/18: 91.5 kg.   Imaging Review Plain radiographs demonstrate severe degenerative joint disease of the left knee(s). The overall alignment ismild varus. The bone quality appears to be adequate for age and reported activity level.   Preoperative templating of the joint replacement has been completed, documented, and submitted to the Operating Room personnel in order to optimize intra-operative equipment management.   Anticipated LOS equal to or greater than 2 midnights due to - Age 4 and older with one or more of the following:  - Obesity  - Expected need for hospital services (PT, OT, Nursing) required for safe  discharge  - Anticipated need for postoperative skilled nursing care or inpatient rehab  - Active co-morbidities: None OR   - Unanticipated findings during/Post Surgery: None  - Patient is a high risk of re-admission due to: None     Assessment/Plan:  End stage arthritis, left knee   The patient history, physical examination, clinical judgment of the provider and imaging studies are consistent with end stage degenerative joint disease of the left knee(s) and total knee arthroplasty is deemed medically necessary. The treatment options including medical management, injection therapy arthroscopy and arthroplasty were discussed at length. The risks and benefits of total knee arthroplasty were presented and reviewed. The risks due to aseptic loosening, infection, stiffness,  patella tracking problems, thromboembolic complications and other imponderables were discussed. The patient acknowledged the explanation, agreed to proceed with the plan and consent was signed. Patient is being admitted for inpatient treatment for surgery, pain control, PT, OT, prophylactic antibiotics, VTE prophylaxis, progressive ambulation and ADL's and discharge planning. The patient is planning to be discharged home.   Therapy Plans: outpatient therapy at [facility] Disposition: Home with wife Planned DVT Prophylaxis: Aspirin 325 mg BID DME needed: 3-in-1 PCP: Dr. Ma Hillock TXA: IV Allergies: Codeine Anesthesia Concerns: None BMI: 26.3  - Patient was instructed on what medications to stop prior to surgery. - Follow-up visit in 2 weeks with Dr. Wynelle Link - Begin physical therapy following surgery - Pre-operative lab work as pre-surgical testing - Prescriptions will be provided in hospital at time of discharge  Theresa Duty, PA-C Orthopedic Surgery [facility] [place]

## 2018-05-07 ENCOUNTER — Other Ambulatory Visit (INDEPENDENT_AMBULATORY_CARE_PROVIDER_SITE_OTHER): Payer: 59

## 2018-05-07 DIAGNOSIS — Z23 Encounter for immunization: Secondary | ICD-10-CM

## 2018-05-15 NOTE — Patient Instructions (Signed)
Andre Martinez  05/15/2018   Your procedure is scheduled on: Monday 05/21/2018  Report to [facility] Main  Entrance              Report to admitting at [phone] AM    Call this number if you have problems the morning of surgery [phone]    Remember: Do not eat food or drink liquids :After Midnight.              BRUSH YOUR TEETH MORNING OF SURGERY AND RINSE YOUR MOUTH OUT, NO CHEWING GUM CANDY OR MINTS.     Take these medicines the morning of surgery with A SIP OF WATER: Metoprolol succinate (Toprol-XL), Famotidine (Pepcid) if needed                                  You may not have any metal on your body including hair pins and              piercings  Do not wear jewelry, make-up, lotions, powders or perfumes, deodorant                         Men may shave face and neck.   Do not bring valuables to the hospital. [place].  Contacts, dentures or bridgework may not be worn into surgery.  Leave suitcase in the car. After surgery it may be brought to your room.                  Please read over the following fact sheets you were given: _____________________________________________________________________             [facility] - Preparing for Surgery Before surgery, you can play an important role.  Because skin is not sterile, your skin needs to be as free of germs as possible.  You can reduce the number of germs on your skin by washing with CHG (chlorahexidine gluconate) soap before surgery.  CHG is an antiseptic cleaner which kills germs and bonds with the skin to continue killing germs even after washing. Please DO NOT use if you have an allergy to CHG or antibacterial soaps.  If your skin becomes reddened/irritated stop using the CHG and inform your nurse when you arrive at Short Stay. Do not shave (including legs and underarms) for at least 48 hours prior to the first CHG shower.  You may shave  your face/neck. Please follow these instructions carefully:  1.  Shower with CHG Soap the night before surgery and the  morning of Surgery.  2.  If you choose to wash your hair, wash your hair first as usual with your  normal  shampoo.  3.  After you shampoo, rinse your hair and body thoroughly to remove the  shampoo.                           4.  Use CHG as you would any other liquid soap.  You can apply chg directly  to the skin and wash                       Gently with a  scrungie or clean washcloth.  5.  Apply the CHG Soap to your body ONLY FROM THE NECK DOWN.   Do not use on face/ open                           Wound or open sores. Avoid contact with eyes, ears mouth and genitals (private parts).                       Wash face,  Genitals (private parts) with your normal soap.             6.  Wash thoroughly, paying special attention to the area where your surgery  will be performed.  7.  Thoroughly rinse your body with warm water from the neck down.  8.  DO NOT shower/wash with your normal soap after using and rinsing off  the CHG Soap.                9.  Pat yourself dry with a clean towel.            10.  Wear clean pajamas.            11.  Place clean sheets on your bed the night of your first shower and do not  sleep with pets. Day of Surgery : Do not apply any lotions/deodorants the morning of surgery.  Please wear clean clothes to the hospital/surgery center.  FAILURE TO FOLLOW THESE INSTRUCTIONS MAY RESULT IN THE CANCELLATION OF YOUR SURGERY PATIENT SIGNATURE_________________________________  NURSE SIGNATURE__________________________________  ________________________________________________________________________   Andre Martinez  An incentive spirometer is a tool that can help keep your lungs clear and active. This tool measures how well you are filling your lungs with each breath. Taking long deep breaths may help reverse or decrease the chance of developing  breathing (pulmonary) problems (especially infection) following:  A long period of time when you are unable to move or be active. BEFORE THE PROCEDURE   If the spirometer includes an indicator to show your best effort, your nurse or respiratory therapist will set it to a desired goal.  If possible, sit up straight or lean slightly forward. Try not to slouch.  Hold the incentive spirometer in an upright position. INSTRUCTIONS FOR USE  1. Sit on the edge of your bed if possible, or sit up as far as you can in bed or on a chair. 2. Hold the incentive spirometer in an upright position. 3. Breathe out normally. 4. Place the mouthpiece in your mouth and seal your lips tightly around it. 5. Breathe in slowly and as deeply as possible, raising the piston or the ball toward the top of the column. 6. Hold your breath for 3-5 seconds or for as long as possible. Allow the piston or ball to fall to the bottom of the column. 7. Remove the mouthpiece from your mouth and breathe out normally. 8. Rest for a few seconds and repeat Steps 1 through 7 at least 10 times every 1-2 hours when you are awake. Take your time and take a few normal breaths between deep breaths. 9. The spirometer may include an indicator to show your best effort. Use the indicator as a goal to work toward during each repetition. 10. After each set of 10 deep breaths, practice coughing to be sure your lungs are clear. If you have an incision (the cut made at the time of surgery), support your incision  when coughing by placing a pillow or rolled up towels firmly against it. Once you are able to get out of bed, walk around indoors and cough well. You may stop using the incentive spirometer when instructed by your caregiver.  RISKS AND COMPLICATIONS  Take your time so you do not get dizzy or light-headed.  If you are in pain, you may need to take or ask for pain medication before doing incentive spirometry. It is harder to take a deep  breath if you are having pain. AFTER USE  Rest and breathe slowly and easily.  It can be helpful to keep track of a log of your progress. Your caregiver can provide you with a simple table to help with this. If you are using the spirometer at home, follow these instructions: [place] IF:   You are having difficultly using the spirometer.  You have trouble using the spirometer as often as instructed.  Your pain medication is not giving enough relief while using the spirometer.  You develop fever of 100.5 F (38.1 C) or higher. SEEK IMMEDIATE MEDICAL CARE IF:   You cough up bloody sputum that had not been present before.  You develop fever of 102 F (38.9 C) or greater.  You develop worsening pain at or near the incision site. MAKE SURE YOU:   Understand these instructions.  Will watch your condition.  Will get help right away if you are not doing well or get worse. Document Released: 09/12/2006 Document Revised: 07/25/2011 Document Reviewed: 11/13/2006 Exit[employer] Patient Information 2014 [employer], [place].   ________________________________________________________________________  WHAT IS A BLOOD TRANSFUSION? Blood Transfusion Information  A transfusion is the replacement of blood or some of its parts. Blood is made up of multiple cells which provide different functions.  Red blood cells carry oxygen and are used for blood loss replacement.  White blood cells fight against infection.  Platelets control bleeding.  Plasma helps clot blood.  Other blood products are available for specialized needs, such as hemophilia or other clotting disorders. BEFORE THE TRANSFUSION  Who gives blood for transfusions?   Healthy volunteers who are fully evaluated to make sure their blood is safe. This is blood bank blood. Transfusion therapy is the safest it has ever been in the practice of medicine. Before blood is taken from a donor, a complete history is taken to make sure  that person has no history of diseases nor engages in risky social behavior (examples are intravenous drug use or sexual activity with multiple partners). The donor's travel history is screened to minimize risk of transmitting infections, such as malaria. The donated blood is tested for signs of infectious diseases, such as HIV and hepatitis. The blood is then tested to be sure it is compatible with you in order to minimize the chance of a transfusion reaction. If you or a relative donates blood, this is often done in anticipation of surgery and is not appropriate for emergency situations. It takes many days to process the donated blood. RISKS AND COMPLICATIONS Although transfusion therapy is very safe and saves many lives, the main dangers of transfusion include:   Getting an infectious disease.  Developing a transfusion reaction. This is an allergic reaction to something in the blood you were given. Every precaution is taken to prevent this. The decision to have a blood transfusion has been considered carefully by your caregiver before blood is given. Blood is not given unless the benefits outweigh the risks. AFTER THE TRANSFUSION  Right after  receiving a blood transfusion, you will usually feel much better and more energetic. This is especially true if your red blood cells have gotten low (anemic). The transfusion raises the level of the red blood cells which carry oxygen, and this usually causes an energy increase.  The nurse administering the transfusion will monitor you carefully for complications. HOME CARE INSTRUCTIONS  No special instructions are needed after a transfusion. You may find your energy is better. Speak with your caregiver about any limitations on activity for underlying diseases you may have. SEEK MEDICAL CARE IF:   Your condition is not improving after your transfusion.  You develop redness or irritation at the intravenous (IV) site. SEEK IMMEDIATE MEDICAL CARE IF:  Any of  the following symptoms occur over the next 12 hours:  Shaking chills.  You have a temperature by mouth above 102 F (38.9 C), not controlled by medicine.  Chest, back, or muscle pain.  People around you feel you are not acting correctly or are confused.  Shortness of breath or difficulty breathing.  Dizziness and fainting.  You get a rash or develop hives.  You have a decrease in urine output.  Your urine turns a dark color or changes to pink, red, or brown. Any of the following symptoms occur over the next 10 days:  You have a temperature by mouth above 102 F (38.9 C), not controlled by medicine.  Shortness of breath.  Weakness after normal activity.  The white part of the eye turns yellow (jaundice).  You have a decrease in the amount of urine or are urinating less often.  Your urine turns a dark color or changes to pink, red, or brown. Document Released: 04/29/2000 Document Revised: 07/25/2011 Document Reviewed: 12/17/2007 [facility] Patient Information 2014 [place], [place].  _______________________________________________________________________

## 2018-05-17 ENCOUNTER — Encounter (HOSPITAL_COMMUNITY)
Admission: RE | Admit: 2018-05-17 | Discharge: 2018-05-17 | Disposition: A | Discharge status: Home / Self Care | Payer: 59 | Source: Ambulatory Visit | Attending: Orthopedic Surgery | Admitting: Orthopedic Surgery

## 2018-05-17 ENCOUNTER — Other Ambulatory Visit: Payer: Self-pay

## 2018-05-17 ENCOUNTER — Encounter (HOSPITAL_COMMUNITY): Payer: Self-pay

## 2018-05-17 DIAGNOSIS — M1712 Unilateral primary osteoarthritis, left knee: Secondary | ICD-10-CM | POA: Diagnosis not present

## 2018-05-17 DIAGNOSIS — Z01812 Encounter for preprocedural laboratory examination: Secondary | ICD-10-CM | POA: Diagnosis present

## 2018-05-17 LAB — SURGICAL PCR SCREEN
MRSA, PCR: NEGATIVE
Staphylococcus aureus: NEGATIVE

## 2018-05-17 LAB — CBC
HCT: 47.3 % (ref 39.0–52.0)
Hemoglobin: 15.3 g/dL (ref 13.0–17.0)
MCH: 29.8 pg (ref 26.0–34.0)
MCHC: 32.3 g/dL (ref 30.0–36.0)
MCV: 92 fL (ref 80.0–100.0)
PLATELETS: 217 10*3/uL (ref 150–400)
RBC: 5.14 MIL/uL (ref 4.22–5.81)
RDW: 12.6 % (ref 11.5–15.5)
WBC: 7 10*3/uL (ref 4.0–10.5)
nRBC: 0 % (ref 0.0–0.2)

## 2018-05-17 LAB — APTT: aPTT: 38 seconds — ABNORMAL HIGH (ref 24–36)

## 2018-05-17 LAB — COMPREHENSIVE METABOLIC PANEL
ALT: 23 U/L (ref 0–44)
ANION GAP: 9 (ref 5–15)
AST: 23 U/L (ref 15–41)
Albumin: 3.8 g/dL (ref 3.5–5.0)
Alkaline Phosphatase: 63 U/L (ref 38–126)
BUN: 19 mg/dL (ref 8–23)
CHLORIDE: 107 mmol/L (ref 98–111)
CO2: 23 mmol/L (ref 22–32)
Calcium: 8.8 mg/dL — ABNORMAL LOW (ref 8.9–10.3)
Creatinine, Ser: 0.86 mg/dL (ref 0.61–1.24)
GFR calc Af Amer: >60 mL/min (ref >60)
GFR calc non Af Amer: >60 mL/min (ref >60)
Glucose, Bld: 97 mg/dL (ref 70–99)
Potassium: 4.3 mmol/L (ref 3.5–5.1)
Sodium: 139 mmol/L (ref 135–145)
Total Bilirubin: 0.6 mg/dL (ref 0.3–1.2)
Total Protein: 6.7 g/dL (ref 6.5–8.1)

## 2018-05-17 LAB — PROTIME-INR
INR: 0.89
Prothrombin Time: 11.9 seconds (ref 11.4–15.2)

## 2018-05-17 LAB — ABO/RH: ABO/RH(D): O POS

## 2018-05-17 NOTE — Progress Notes (Signed)
03/06/2018- noted in [place] note from Dr. Jill Alexanders and EKG

## 2018-05-20 MED ORDER — BUPIVACAINE LIPOSOME 1.3 % IJ SUSP
20.0000 mL | Freq: Once | INTRAMUSCULAR | Status: DC
  Filled 2018-05-20: qty 20

## 2018-05-21 ENCOUNTER — Other Ambulatory Visit: Payer: Self-pay

## 2018-05-21 ENCOUNTER — Encounter (HOSPITAL_COMMUNITY): Payer: Self-pay | Admitting: *Deleted

## 2018-05-21 ENCOUNTER — Inpatient Hospital Stay (HOSPITAL_COMMUNITY)
Admission: AD | Admit: 2018-05-21 | Discharge: 2018-05-22 | DRG: 470 | Disposition: A | Discharge status: Home / Self Care | Payer: 59 | Attending: Orthopedic Surgery | Admitting: Orthopedic Surgery

## 2018-05-21 ENCOUNTER — Encounter (HOSPITAL_COMMUNITY)
Admission: AD | Disposition: A | Discharge status: Home / Self Care | Payer: Self-pay | Source: Home / Self Care | Attending: Orthopedic Surgery

## 2018-05-21 ENCOUNTER — Ambulatory Visit (HOSPITAL_COMMUNITY): Payer: 59 | Admitting: Anesthesiology

## 2018-05-21 ENCOUNTER — Ambulatory Visit (HOSPITAL_COMMUNITY): Payer: 59 | Admitting: Physician Assistant

## 2018-05-21 DIAGNOSIS — Z7982 Long term (current) use of aspirin: Secondary | ICD-10-CM

## 2018-05-21 DIAGNOSIS — E785 Hyperlipidemia, unspecified: Secondary | ICD-10-CM | POA: Diagnosis present

## 2018-05-21 DIAGNOSIS — Z87891 Personal history of nicotine dependence: Secondary | ICD-10-CM

## 2018-05-21 DIAGNOSIS — I10 Essential (primary) hypertension: Secondary | ICD-10-CM | POA: Diagnosis present

## 2018-05-21 DIAGNOSIS — M179 Osteoarthritis of knee, unspecified: Secondary | ICD-10-CM | POA: Diagnosis present

## 2018-05-21 DIAGNOSIS — K219 Gastro-esophageal reflux disease without esophagitis: Secondary | ICD-10-CM | POA: Diagnosis present

## 2018-05-21 DIAGNOSIS — M1712 Unilateral primary osteoarthritis, left knee: Principal | ICD-10-CM | POA: Diagnosis present

## 2018-05-21 DIAGNOSIS — Z79899 Other long term (current) drug therapy: Secondary | ICD-10-CM | POA: Diagnosis not present

## 2018-05-21 DIAGNOSIS — M1710 Unilateral primary osteoarthritis, unspecified knee: Secondary | ICD-10-CM | POA: Diagnosis present

## 2018-05-21 HISTORY — PX: TOTAL KNEE ARTHROPLASTY: SHX125

## 2018-05-21 LAB — TYPE AND SCREEN
ABO/RH(D): O POS
ANTIBODY SCREEN: NEGATIVE

## 2018-05-21 SURGERY — ARTHROPLASTY, KNEE, TOTAL
Anesthesia: Spinal | Site: Knee | Laterality: Left

## 2018-05-21 MED ORDER — DEXAMETHASONE SODIUM PHOSPHATE 10 MG/ML IJ SOLN
INTRAMUSCULAR | Status: AC
  Filled 2018-05-21: qty 1

## 2018-05-21 MED ORDER — GLYCOPYRROLATE PF 0.2 MG/ML IJ SOSY
PREFILLED_SYRINGE | INTRAMUSCULAR | Status: AC
  Filled 2018-05-21: qty 1

## 2018-05-21 MED ORDER — METHOCARBAMOL 500 MG IVPB - SIMPLE MED
500.0000 mg | Freq: Four times a day (QID) | INTRAVENOUS | Status: DC | PRN
  Administered 2018-05-21: 500 mg via INTRAVENOUS
  Filled 2018-05-21: qty 50

## 2018-05-21 MED ORDER — MORPHINE SULFATE (PF) 2 MG/ML IV SOLN: 1.0000 mg | INTRAVENOUS | Status: DC | PRN

## 2018-05-21 MED ORDER — ONDANSETRON HCL 4 MG/2ML IJ SOLN
INTRAMUSCULAR | Status: DC | PRN
  Administered 2018-05-21: 4 mg via INTRAVENOUS

## 2018-05-21 MED ORDER — PROPOFOL 10 MG/ML IV BOLUS
INTRAVENOUS | Status: DC | PRN
  Administered 2018-05-21: 40 mg via INTRAVENOUS

## 2018-05-21 MED ORDER — HYDROMORPHONE HCL 1 MG/ML IJ SOLN: 0.2500 mg | INTRAMUSCULAR | Status: DC | PRN

## 2018-05-21 MED ORDER — GLYCOPYRROLATE PF 0.2 MG/ML IJ SOSY
PREFILLED_SYRINGE | INTRAMUSCULAR | Status: DC | PRN
  Administered 2018-05-21: .2 mg via INTRAVENOUS

## 2018-05-21 MED ORDER — PHENYLEPHRINE HCL 10 MG/ML IJ SOLN
INTRAMUSCULAR | Status: AC
  Filled 2018-05-21: qty 1

## 2018-05-21 MED ORDER — METOCLOPRAMIDE HCL 5 MG/ML IJ SOLN: 5.0000 mg | Freq: Three times a day (TID) | INTRAMUSCULAR | Status: DC | PRN

## 2018-05-21 MED ORDER — POLYETHYLENE GLYCOL 3350 17 G PO PACK: 17.0000 g | PACK | Freq: Every day | ORAL | Status: DC | PRN

## 2018-05-21 MED ORDER — CHLORHEXIDINE GLUCONATE 4 % EX LIQD: 60.0000 mL | Freq: Once | CUTANEOUS | Status: DC

## 2018-05-21 MED ORDER — METOCLOPRAMIDE HCL 5 MG PO TABS: 5.0000 mg | ORAL_TABLET | Freq: Three times a day (TID) | ORAL | Status: DC | PRN

## 2018-05-21 MED ORDER — ONDANSETRON HCL 4 MG PO TABS: 4.0000 mg | ORAL_TABLET | Freq: Four times a day (QID) | ORAL | Status: DC | PRN

## 2018-05-21 MED ORDER — BUPIVACAINE LIPOSOME 1.3 % IJ SUSP
INTRAMUSCULAR | Status: DC | PRN
  Administered 2018-05-21: 20 mL

## 2018-05-21 MED ORDER — PROMETHAZINE HCL 25 MG/ML IJ SOLN: 6.2500 mg | INTRAMUSCULAR | Status: DC | PRN

## 2018-05-21 MED ORDER — DOCUSATE SODIUM 100 MG PO CAPS
100.0000 mg | ORAL_CAPSULE | Freq: Two times a day (BID) | ORAL | Status: DC
  Administered 2018-05-21 – 2018-05-22 (×2): 100 mg via ORAL
  Filled 2018-05-21 (×2): qty 1

## 2018-05-21 MED ORDER — SODIUM CHLORIDE 0.9 % IV SOLN
INTRAVENOUS | Status: DC
  Administered 2018-05-21 – 2018-05-22 (×3): via INTRAVENOUS

## 2018-05-21 MED ORDER — PROPOFOL 500 MG/50ML IV EMUL
INTRAVENOUS | Status: DC | PRN
  Administered 2018-05-21: 100 ug/kg/min via INTRAVENOUS

## 2018-05-21 MED ORDER — METHOCARBAMOL 500 MG PO TABS: 500.0000 mg | ORAL_TABLET | Freq: Four times a day (QID) | ORAL | Status: DC | PRN

## 2018-05-21 MED ORDER — BUPIVACAINE IN DEXTROSE 0.75-8.25 % IT SOLN
INTRATHECAL | Status: DC | PRN
  Administered 2018-05-21: 1.8 mL via INTRATHECAL

## 2018-05-21 MED ORDER — SODIUM CHLORIDE 0.9 % IV SOLN
INTRAVENOUS | Status: DC | PRN
  Administered 2018-05-21: 40 ug/min via INTRAVENOUS

## 2018-05-21 MED ORDER — SODIUM CHLORIDE (PF) 0.9 % IJ SOLN
INTRAMUSCULAR | Status: DC | PRN
  Administered 2018-05-21: 60 mL

## 2018-05-21 MED ORDER — SODIUM CHLORIDE (PF) 0.9 % IJ SOLN
INTRAMUSCULAR | Status: AC
  Filled 2018-05-21: qty 10

## 2018-05-21 MED ORDER — OXYCODONE HCL 5 MG PO TABS
5.0000 mg | ORAL_TABLET | ORAL | Status: DC | PRN
  Administered 2018-05-21 – 2018-05-22 (×5): 5 mg via ORAL
  Filled 2018-05-21 (×3): qty 1
  Filled 2018-05-21: qty 2
  Filled 2018-05-21 (×2): qty 1

## 2018-05-21 MED ORDER — ACETAMINOPHEN 500 MG PO TABS
1000.0000 mg | ORAL_TABLET | Freq: Four times a day (QID) | ORAL | Status: AC
  Administered 2018-05-21 – 2018-05-22 (×4): 1000 mg via ORAL
  Filled 2018-05-21 (×4): qty 2

## 2018-05-21 MED ORDER — PROPOFOL 10 MG/ML IV BOLUS
INTRAVENOUS | Status: AC
  Filled 2018-05-21: qty 60

## 2018-05-21 MED ORDER — ROPIVACAINE HCL 7.5 MG/ML IJ SOLN
INTRAMUSCULAR | Status: DC | PRN
  Administered 2018-05-21: 20 mL via PERINEURAL

## 2018-05-21 MED ORDER — DEXAMETHASONE SODIUM PHOSPHATE 10 MG/ML IJ SOLN
8.0000 mg | Freq: Once | INTRAMUSCULAR | Status: AC
  Administered 2018-05-21: 8 mg via INTRAVENOUS

## 2018-05-21 MED ORDER — CEFAZOLIN SODIUM-DEXTROSE 2-4 GM/100ML-% IV SOLN
2.0000 g | INTRAVENOUS | Status: AC
  Administered 2018-05-21: 2 g via INTRAVENOUS
  Filled 2018-05-21: qty 100

## 2018-05-21 MED ORDER — 0.9 % SODIUM CHLORIDE (POUR BTL) OPTIME
TOPICAL | Status: DC | PRN
  Administered 2018-05-21: 1000 mL

## 2018-05-21 MED ORDER — TRANEXAMIC ACID-NACL 1000-0.7 MG/100ML-% IV SOLN
1000.0000 mg | INTRAVENOUS | Status: AC
  Administered 2018-05-21: 1000 mg via INTRAVENOUS
  Filled 2018-05-21: qty 100

## 2018-05-21 MED ORDER — LACTATED RINGERS IV SOLN
INTRAVENOUS | Status: DC
  Administered 2018-05-21 (×2): via INTRAVENOUS

## 2018-05-21 MED ORDER — SODIUM CHLORIDE (PF) 0.9 % IJ SOLN
INTRAMUSCULAR | Status: AC
  Filled 2018-05-21: qty 50

## 2018-05-21 MED ORDER — MIDAZOLAM HCL 2 MG/2ML IJ SOLN
1.0000 mg | INTRAMUSCULAR | Status: DC
  Administered 2018-05-21: 2 mg via INTRAVENOUS
  Filled 2018-05-21: qty 2

## 2018-05-21 MED ORDER — PROPOFOL 10 MG/ML IV BOLUS
INTRAVENOUS | Status: AC
  Filled 2018-05-21: qty 20

## 2018-05-21 MED ORDER — FLEET ENEMA 7-19 GM/118ML RE ENEM: 1.0000 | ENEMA | Freq: Once | RECTAL | Status: DC | PRN

## 2018-05-21 MED ORDER — ONDANSETRON HCL 4 MG/2ML IJ SOLN
INTRAMUSCULAR | Status: AC
  Filled 2018-05-21: qty 2

## 2018-05-21 MED ORDER — CLONIDINE HCL (ANALGESIA) 100 MCG/ML EP SOLN
EPIDURAL | Status: DC | PRN
  Administered 2018-05-21: 70 ug

## 2018-05-21 MED ORDER — METHOCARBAMOL 500 MG IVPB - SIMPLE MED
INTRAVENOUS | Status: AC
  Administered 2018-05-21: 500 mg via INTRAVENOUS
  Filled 2018-05-21: qty 50

## 2018-05-21 MED ORDER — ONDANSETRON HCL 4 MG/2ML IJ SOLN: 4.0000 mg | Freq: Four times a day (QID) | INTRAMUSCULAR | Status: DC | PRN

## 2018-05-21 MED ORDER — BISACODYL 10 MG RE SUPP: 10.0000 mg | Freq: Every day | RECTAL | Status: DC | PRN

## 2018-05-21 MED ORDER — TRAMADOL HCL 50 MG PO TABS
50.0000 mg | ORAL_TABLET | Freq: Four times a day (QID) | ORAL | Status: DC | PRN
  Administered 2018-05-21 – 2018-05-22 (×2): 50 mg via ORAL
  Filled 2018-05-21 (×2): qty 1

## 2018-05-21 MED ORDER — FENTANYL CITRATE (PF) 100 MCG/2ML IJ SOLN
50.0000 ug | INTRAMUSCULAR | Status: DC
  Administered 2018-05-21: 50 ug via INTRAVENOUS
  Filled 2018-05-21: qty 2

## 2018-05-21 MED ORDER — DEXAMETHASONE SODIUM PHOSPHATE 10 MG/ML IJ SOLN
10.0000 mg | Freq: Once | INTRAMUSCULAR | Status: AC
  Administered 2018-05-22: 10 mg via INTRAVENOUS
  Filled 2018-05-21: qty 1

## 2018-05-21 MED ORDER — ACETAMINOPHEN 10 MG/ML IV SOLN
1000.0000 mg | Freq: Four times a day (QID) | INTRAVENOUS | Status: DC
  Administered 2018-05-21: 1000 mg via INTRAVENOUS
  Filled 2018-05-21: qty 100

## 2018-05-21 MED ORDER — SODIUM CHLORIDE 0.9 % IR SOLN
Status: DC | PRN
  Administered 2018-05-21: 1000 mL

## 2018-05-21 MED ORDER — ASPIRIN EC 325 MG PO TBEC
325.0000 mg | DELAYED_RELEASE_TABLET | Freq: Two times a day (BID) | ORAL | Status: DC
  Administered 2018-05-22: 325 mg via ORAL
  Filled 2018-05-21: qty 1

## 2018-05-21 MED ORDER — CEFAZOLIN SODIUM-DEXTROSE 2-4 GM/100ML-% IV SOLN
2.0000 g | Freq: Four times a day (QID) | INTRAVENOUS | Status: AC
  Administered 2018-05-21 (×2): 2 g via INTRAVENOUS
  Filled 2018-05-21 (×2): qty 100

## 2018-05-21 MED ORDER — GABAPENTIN 300 MG PO CAPS
300.0000 mg | ORAL_CAPSULE | Freq: Three times a day (TID) | ORAL | Status: DC
  Administered 2018-05-21 – 2018-05-22 (×3): 300 mg via ORAL
  Filled 2018-05-21 (×3): qty 1

## 2018-05-21 MED ORDER — FAMOTIDINE 20 MG PO TABS: 20.0000 mg | ORAL_TABLET | Freq: Every day | ORAL | Status: DC | PRN

## 2018-05-21 MED ORDER — DIPHENHYDRAMINE HCL 12.5 MG/5ML PO ELIX: 12.5000 mg | ORAL_SOLUTION | ORAL | Status: DC | PRN

## 2018-05-21 MED ORDER — METOPROLOL SUCCINATE ER 50 MG PO TB24
50.0000 mg | ORAL_TABLET | Freq: Every day | ORAL | Status: DC
  Administered 2018-05-22: 50 mg via ORAL
  Filled 2018-05-21: qty 1

## 2018-05-21 MED ORDER — GABAPENTIN 300 MG PO CAPS
300.0000 mg | ORAL_CAPSULE | Freq: Once | ORAL | Status: AC
  Administered 2018-05-21: 300 mg via ORAL
  Filled 2018-05-21: qty 1

## 2018-05-21 MED ORDER — PHENOL 1.4 % MT LIQD: 1.0000 | OROMUCOSAL | Status: DC | PRN

## 2018-05-21 MED ORDER — MENTHOL 3 MG MT LOZG: 1.0000 | LOZENGE | OROMUCOSAL | Status: DC | PRN

## 2018-05-21 MED ORDER — TRANEXAMIC ACID-NACL 1000-0.7 MG/100ML-% IV SOLN
1000.0000 mg | Freq: Once | INTRAVENOUS | Status: AC
  Administered 2018-05-21: 1000 mg via INTRAVENOUS
  Filled 2018-05-21: qty 100

## 2018-05-21 SURGICAL SUPPLY — 64 items
ATTUNE MED DOME PAT 41 KNEE (Knees) ×1 IMPLANT
ATTUNE MED DOME PAT 41MM KNEE (Knees) ×1 IMPLANT
ATTUNE PS FEM LT SZ 6 CEM KNEE (Femur) ×2 IMPLANT
ATTUNE PSRP INSR SZ6 8 KNEE (Insert) ×1 IMPLANT
ATTUNE PSRP INSR SZ6 8MM KNEE (Insert) ×1 IMPLANT
BAG SPEC THK2 15X12 ZIP CLS (MISCELLANEOUS) ×1
BAG ZIPLOCK 12X15 (MISCELLANEOUS) ×3 IMPLANT
BANDAGE ACE 4X5 VEL STRL LF (GAUZE/BANDAGES/DRESSINGS) ×2 IMPLANT
BANDAGE ACE 6X5 VEL STRL LF (GAUZE/BANDAGES/DRESSINGS) ×1 IMPLANT
BASE TIBIAL ROT PLAT SZ 5 KNEE (Knees) IMPLANT
BLADE SAG 18X100X1.27 (BLADE) ×3 IMPLANT
BLADE SAW SGTL 11.0X1.19X90.0M (BLADE) ×3 IMPLANT
BLADE SURG SZ10 CARB STEEL (BLADE) ×4 IMPLANT
BOWL SMART MIX CTS (DISPOSABLE) ×3 IMPLANT
BSPLAT TIB 5 CMNT ROT PLAT STR (Knees) ×1 IMPLANT
CEMENT HV SMART SET (Cement) ×6 IMPLANT
CLOSURE WOUND 1/2 X4 (GAUZE/BANDAGES/DRESSINGS) ×2
COVER SURGICAL LIGHT HANDLE (MISCELLANEOUS) ×3 IMPLANT
COVER WAND RF STERILE (DRAPES) ×2 IMPLANT
CUFF TOURN SGL QUICK 34 (TOURNIQUET CUFF) ×3
CUFF TRNQT CYL 34X4X40X1 (TOURNIQUET CUFF) ×1 IMPLANT
DECANTER SPIKE VIAL GLASS SM (MISCELLANEOUS) ×3 IMPLANT
DRAPE U-SHAPE 47X51 STRL (DRAPES) ×3 IMPLANT
DRSG ADAPTIC 3X8 NADH LF (GAUZE/BANDAGES/DRESSINGS) ×3 IMPLANT
DRSG PAD ABDOMINAL 8X10 ST (GAUZE/BANDAGES/DRESSINGS) ×1 IMPLANT
DURAPREP 26ML APPLICATOR (WOUND CARE) ×3 IMPLANT
ELECT REM PT RETURN 15FT ADLT (MISCELLANEOUS) ×3 IMPLANT
EVACUATOR 1/8 PVC DRAIN (DRAIN) ×3 IMPLANT
GAUZE SPONGE 4X4 12PLY STRL (GAUZE/BANDAGES/DRESSINGS) ×3 IMPLANT
GLOVE BIO SURGEON STRL SZ7 (GLOVE) ×1 IMPLANT
GLOVE BIO SURGEON STRL SZ8 (GLOVE) ×3 IMPLANT
GLOVE BIOGEL PI IND STRL 6.5 (GLOVE) ×1 IMPLANT
GLOVE BIOGEL PI IND STRL 7.0 (GLOVE) ×1 IMPLANT
GLOVE BIOGEL PI IND STRL 8 (GLOVE) ×1 IMPLANT
GLOVE BIOGEL PI INDICATOR 6.5 (GLOVE) ×2
GLOVE BIOGEL PI INDICATOR 7.0 (GLOVE)
GLOVE BIOGEL PI INDICATOR 8 (GLOVE) ×2
GLOVE SURG SS PI 6.5 STRL IVOR (GLOVE) ×3 IMPLANT
GOWN STRL REUS W/TWL LRG LVL3 (GOWN DISPOSABLE) ×6 IMPLANT
GOWN STRL REUS W/TWL XL LVL3 (GOWN DISPOSABLE) ×3 IMPLANT
HANDPIECE INTERPULSE COAX TIP (DISPOSABLE) ×3
HOLDER FOLEY CATH W/STRAP (MISCELLANEOUS) ×2 IMPLANT
IMMOBILIZER KNEE 20 (SOFTGOODS) ×2 IMPLANT
IMMOBILIZER KNEE 20 THIGH 36 (SOFTGOODS) ×1 IMPLANT
MANIFOLD NEPTUNE II (INSTRUMENTS) ×3 IMPLANT
NS IRRIG 1000ML POUR BTL (IV SOLUTION) ×1 IMPLANT
PACK TOTAL KNEE CUSTOM (KITS) ×3 IMPLANT
PAD ABD 8X10 STRL (GAUZE/BANDAGES/DRESSINGS) ×2 IMPLANT
PADDING CAST COTTON 6X4 STRL (CAST SUPPLIES) ×7 IMPLANT
PIN STEINMAN FIXATION KNEE (PIN) ×2 IMPLANT
PIN THREADED HEADED SIGMA (PIN) ×2 IMPLANT
PROTECTOR NERVE ULNAR (MISCELLANEOUS) ×3 IMPLANT
SET HNDPC FAN SPRY TIP SCT (DISPOSABLE) ×1 IMPLANT
STRIP CLOSURE SKIN 1/2X4 (GAUZE/BANDAGES/DRESSINGS) ×4 IMPLANT
SUT MNCRL AB 4-0 PS2 18 (SUTURE) ×3 IMPLANT
SUT STRATAFIX 0 PDS 27 VIOLET (SUTURE) ×3
SUT VIC AB 2-0 CT1 27 (SUTURE) ×9
SUT VIC AB 2-0 CT1 TAPERPNT 27 (SUTURE) ×3 IMPLANT
SUTURE STRATFX 0 PDS 27 VIOLET (SUTURE) ×1 IMPLANT
TIBIAL BASE ROT PLAT SZ 5 KNEE (Knees) ×3 IMPLANT
TRAY FOLEY MTR SLVR 16FR STAT (SET/KITS/TRAYS/PACK) ×3 IMPLANT
WATER STERILE IRR 1000ML POUR (IV SOLUTION) ×6 IMPLANT
WRAP KNEE MAXI GEL POST OP (GAUZE/BANDAGES/DRESSINGS) ×3 IMPLANT
YANKAUER SUCT BULB TIP 10FT TU (MISCELLANEOUS) ×3 IMPLANT

## 2018-05-21 NOTE — Anesthesia Postprocedure Evaluation (Signed)
Anesthesia Post Note  Patient: Andre Martinez  Procedure(s) Performed: LEFT TOTAL KNEE ARTHROPLASTY (Left Knee)     Patient location during evaluation: PACU Anesthesia Type: Spinal Level of consciousness: oriented and awake and alert Pain management: pain level controlled Vital Signs Assessment: post-procedure vital signs reviewed and stable Respiratory status: spontaneous breathing, respiratory function stable and patient connected to nasal cannula oxygen Cardiovascular status: blood pressure returned to baseline and stable Postop Assessment: no headache, no backache and no apparent nausea or vomiting Anesthetic complications: no    Last Vitals:  Vitals:   05/21/18 1215 05/21/18 1230  BP: 125/82 118/79  Pulse: (!) 53 (!) 55  Resp: 12 12  Temp:  (!) 36.4 C  SpO2: 100% 100%    Last Pain:  Vitals:   05/21/18 1230  TempSrc:   PainSc: 0-No pain    LLE Motor Response: Purposeful movement (05/21/18 1230) LLE Sensation: Tingling (05/21/18 1230) RLE Motor Response: Purposeful movement (05/21/18 1230) RLE Sensation: Tingling (05/21/18 1230) L Sensory Level: S1-Sole of foot, small toes (05/21/18 1230) R Sensory Level: S1-Sole of foot, small toes (05/21/18 1230)  Izzy Courville S

## 2018-05-21 NOTE — Interval H&P Note (Signed)
History and Physical Interval Note:  05/21/2018 7:17 AM  Andre Martinez  has presented today for surgery, with the diagnosis of left knee osteoarthritis  The various methods of treatment have been discussed with the patient and family. After consideration of risks, benefits and other options for treatment, the patient has consented to  Procedure(s) with comments: LEFT TOTAL KNEE ARTHROPLASTY (Left) - 34min as a surgical intervention .  The patient's history has been reviewed, patient examined, no change in status, stable for surgery.  I have reviewed the patient's chart and labs.  Questions were answered to the patient's satisfaction.     [name]

## 2018-05-21 NOTE — Op Note (Signed)
OPERATIVE REPORT-TOTAL KNEE ARTHROPLASTY   Pre-operative diagnosis- Osteoarthritis  Left knee(s)  Post-operative diagnosis- Osteoarthritis Left knee(s)  Procedure-  Left  Total Knee Arthroplasty  Surgeon- Dione Plover. Farris Blash, MD  Assistant- Griffith Citron, PA-C   Anesthesia-  Adductor canal block and spinal  EBL-50 mL   Drains Hemovac  Tourniquet time-  Total Tourniquet Time Documented: Thigh (Left) - 34 minutes Total: Thigh (Left) - 34 minutes     Complications- None  Condition-PACU - hemodynamically stable.   Brief Clinical Note   Andre Martinez is a 63 y.o. year old male with end stage OA of his left knee with progressively worsening pain and dysfunction. He has constant pain, with activity and at rest and significant functional deficits with difficulties even with ADLs. He has had extensive non-op management including analgesics, injections of cortisone and viscosupplements, and home exercise program, but remains in significant pain with significant dysfunction. Radiographs show bone on bone arthritis medial and patellofemoral. He presents now for left Total Knee Arthroplasty.    Procedure in detail---   The patient is brought into the operating room and positioned supine on the operating table. After successful administration of  Adductor canal block and spinal,   a tourniquet is placed high on the  Left thigh(s) and the lower extremity is prepped and draped in the usual sterile fashion. Time out is performed by the operating team and then the  Left lower extremity is wrapped in Esmarch, knee flexed and the tourniquet inflated to 300 mmHg.       A midline incision is made with a ten blade through the subcutaneous tissue to the level of the extensor mechanism. A fresh blade is used to make a medial parapatellar arthrotomy. Soft tissue over the proximal medial tibia is subperiosteally elevated to the joint line with a knife and into the semimembranosus bursa with a Cobb  elevator. Soft tissue over the proximal lateral tibia is elevated with attention being paid to avoiding the patellar tendon on the tibial tubercle. The patella is everted, knee flexed 90 degrees and the ACL and PCL are removed. Findings are bone on bone medial and patellofemoral with large global osteophytes.        The drill is used to create a starting hole in the distal femur and the canal is thoroughly irrigated with sterile saline to remove the fatty contents. The 5 degree Left  valgus alignment guide is placed into the femoral canal and the distal femoral cutting block is pinned to remove 9 mm off the distal femur. Resection is made with an oscillating saw.      The tibia is subluxed forward and the menisci are removed. The extramedullary alignment guide is placed referencing proximally at the medial aspect of the tibial tubercle and distally along the second metatarsal axis and tibial crest. The block is pinned to remove 58mm off the more deficient medial  side. Resection is made with an oscillating saw. Size 5is the most appropriate size for the tibia and the proximal tibia is prepared with the modular drill and keel punch for that size.      The femoral sizing guide is placed and size 6 is most appropriate. Rotation is marked off the epicondylar axis and confirmed by creating a rectangular flexion gap at 90 degrees. The size 6 cutting block is pinned in this rotation and the anterior, posterior and chamfer cuts are made with the oscillating saw. The intercondylar block is then placed and that cut is made.  Trial size 5 tibial component, trial size 6 posterior stabilized femur and a 8  mm posterior stabilized rotating platform insert trial is placed. Full extension is achieved with excellent varus/valgus and anterior/posterior balance throughout full range of motion. The patella is everted and thickness measured to be 27  mm. Free hand resection is taken to 15 mm, a 41 template is placed, lug holes  are drilled, trial patella is placed, and it tracks normally. Osteophytes are removed off the posterior femur with the trial in place. All trials are removed and the cut bone surfaces prepared with pulsatile lavage. Cement is mixed and once ready for implantation, the size 5 tibial implant, size  6 posterior stabilized femoral component, and the size 41 patella are cemented in place and the patella is held with the clamp. The trial insert is placed and the knee held in full extension. The Exparel (20 ml mixed with 60 ml saline) is injected into the extensor mechanism, posterior capsule, medial and lateral gutters and subcutaneous tissues.  All extruded cement is removed and once the cement is hard the permanent 8 mm posterior stabilized rotating platform insert is placed into the tibial tray.      The wound is copiously irrigated with saline solution and the extensor mechanism closed over a hemovac drain with #1 V-loc suture. The tourniquet is released for a total tourniquet time of 34  minutes. Flexion against gravity is 140 degrees and the patella tracks normally. Subcutaneous tissue is closed with 2.0 vicryl and subcuticular with running 4.0 Monocryl. The incision is cleaned and dried and steri-strips and a bulky sterile dressing are applied. The limb is placed into a knee immobilizer and the patient is awakened and transported to recovery in stable condition.      Please note that a surgical assistant was a medical necessity for this procedure in order to perform it in a safe and expeditious manner. Surgical assistant was necessary to retract the ligaments and vital neurovascular structures to prevent injury to them and also necessary for proper positioning of the limb to allow for anatomic placement of the prosthesis.   Dione Plover Temima Kutsch, MD    05/21/2018, 10:17 AM

## 2018-05-21 NOTE — Progress Notes (Signed)
Assisted Dr. Rose with left, ultrasound guided, adductor canal block. Side rails up, monitors on throughout procedure. See vital signs in flow sheet. Tolerated Procedure well.  

## 2018-05-21 NOTE — Anesthesia Procedure Notes (Signed)
Anesthesia Procedure Image    

## 2018-05-21 NOTE — Evaluation (Signed)
Physical Therapy Evaluation Patient Details Name: Andre Martinez MRN: [id number] DOB: 10-24-1955 Today's Date: 05/21/2018   History of Present Illness  63 yo male s/p L TKR on 05/21/18. PMH includes HLD, GERD, OA, arrythmia, HTN.   Clinical Impression  Pt presents with L knee pain, decreased L knee ROM, increased time and effort to perform mobility, and decreased tolerance for ambulation due to L knee pain. Pt to benefit from acute PT to address deficits. Pt ambulated 35 ft with RW with min guard assist. After 30 ft, pt with nausea and pallor, BP WNL but dropped from previous reading. See mobility section. Pt educated on ankle pumps (20/hour) to perform this afternoon/evening to increase circulation, to pt's tolerance and limited by pain. PT to progress mobility as tolerated, and will continue to follow acutely.        Follow Up Recommendations Follow surgeon's recommendation for DC plan and follow-up therapies;Supervision for mobility/OOB(OPPT)    Equipment Recommendations  None recommended by PT    Recommendations for Other Services       Precautions / Restrictions Precautions Precautions: Fall Required Braces or Orthoses: Knee Immobilizer - Left Knee Immobilizer - Left: On when out of bed or walking;Discontinue once straight leg raise with < 10 degree lag Restrictions Weight Bearing Restrictions: No Other Position/Activity Restrictions: WBAT       Mobility  Bed Mobility Overal bed mobility: Needs Assistance Bed Mobility: Supine to Sit;Sit to Supine     Supine to sit: Min guard;HOB elevated Sit to supine: Min guard;HOB elevated   General bed mobility comments: Min guard for safety. Increased time and effort to perform.   Transfers Overall transfer level: Needs assistance Equipment used: Rolling walker (2 wheeled) Transfers: Sit to/from Stand Sit to Stand: Min guard;From elevated surface         General transfer comment: Min guard for safety. Verbal cuing for hand  placement.   Ambulation/Gait Ambulation/Gait assistance: Min guard;+2 safety/equipment(chair follow ) Gait Distance (Feet): 35 Feet Assistive device: Rolling walker (2 wheeled) Gait Pattern/deviations: Step-to pattern;Decreased stance time - left;Decreased weight shift to left;Antalgic Gait velocity: decr    General Gait Details: Min guard for safety. Verbal cuing for placement in RW, turning. After 30 ft ambulation and when 5 ft from bed, pt with pallor, feeling hot, and nausea. BP and HR 128/75 and 63 bpm. Pt returned to supine position, felt back to baseline after ~2 minutes  Stairs            Wheelchair Mobility    Modified Rankin (Stroke Patients Only)       Balance Overall balance assessment: Mild deficits observed, not formally tested                                           Pertinent Vitals/Pain Pain Assessment: 0-10 Pain Score: 1  Pain Location: L knee  Pain Descriptors / Indicators: Aching Pain Intervention(s): Limited activity within patient's tolerance;Ice applied;Repositioned;Monitored during session;Premedicated before session    Home Living Family/patient expects to be discharged to:: Private residence Living Arrangements: Spouse/significant other Available Help at Discharge: Family;Available PRN/intermittently Type of Home: House Home Access: Stairs to enter Entrance Stairs-Rails: None Entrance Stairs-Number of Steps: 3 Home Layout: Two level;Bed/bath upstairs Home Equipment: Walker - 2 wheels;Shower seat - built in;Bedside commode;Cane - single point;Grab bars - tub/shower      Prior Function Level of Independence: Independent  Hand Dominance   Dominant Hand: Right    Extremity/Trunk Assessment   Upper Extremity Assessment Upper Extremity Assessment: Overall WFL for tasks assessed    Lower Extremity Assessment Lower Extremity Assessment: LLE deficits/detail;Overall WFL for tasks assessed LLE  Deficits / Details: suspected post-surgical weakness; difficulty performing SLR, but able to perform ankle pumps, quad sets, and heel slides  LLE Sensation: WNL    Cervical / Trunk Assessment Cervical / Trunk Assessment: Normal  Communication   Communication: No difficulties  Cognition Arousal/Alertness: Awake/alert Behavior During Therapy: WFL for tasks assessed/performed Overall Cognitive Status: Within Functional Limits for tasks assessed                                        General Comments      Exercises Total Joint Exercises Goniometric ROM: knee aarom ~10-80*, limited by pain    Assessment/Plan    PT Assessment Patient needs continued PT services  PT Problem List Decreased strength;Pain;Decreased activity tolerance;Decreased range of motion;Decreased balance;Decreased safety awareness;Decreased mobility;Decreased knowledge of use of DME       PT Treatment Interventions DME instruction;Therapeutic activities;Gait training;Patient/family education;Therapeutic exercise;Stair training;Balance training;Functional mobility training    PT Goals (Current goals can be found in the Care Plan section)  Acute Rehab PT Goals Patient Stated Goal: decrease L knee pain  PT Goal Formulation: With patient Time For Goal Achievement: 05/28/18 Potential to Achieve Goals: Good    Frequency 7X/week   Barriers to discharge        Co-evaluation               AM-PAC PT "6 Clicks" Mobility  Outcome Measure Help needed turning from your back to your side while in a flat bed without using bedrails?: A Little Help needed moving from lying on your back to sitting on the side of a flat bed without using bedrails?: A Little Help needed moving to and from a bed to a chair (including a wheelchair)?: A Little Help needed standing up from a chair using your arms (e.g., wheelchair or bedside chair)?: A Little Help needed to walk in hospital room?: A Little Help needed  climbing 3-5 steps with a railing? : A Little 6 Click Score: 18    End of Session Equipment Utilized During Treatment: Gait belt;Left knee immobilizer Activity Tolerance: Patient tolerated treatment well;Other (comment)(pt limited by nausea, hypotension ) Patient left: in bed;with bed alarm set;with call bell/phone within reach;with family/visitor present;with SCD's reapplied Nurse Communication: Mobility status PT Visit Diagnosis: Other abnormalities of gait and mobility (R26.89);Difficulty in walking, not elsewhere classified (R26.2)    Time: 1534-1600 PT Time Calculation (min) (ACUTE ONLY): 26 min   Charges:   PT Evaluation $PT Eval Low Complexity: 1 Low PT Treatments $Gait Training: 8-22 mins        Julien Girt, PT Acute Rehabilitation Services Pager [phone]  Office [phone]  Machaela Caterino D Yashas Camilli 05/21/2018, 5:14 PM

## 2018-05-21 NOTE — Discharge Instructions (Signed)
° °Dr. Frank Aluisio °Total Joint Specialist °[facility] °[address] °[place], [place] [address] °[phone] ° °TOTAL KNEE REPLACEMENT POSTOPERATIVE DIRECTIONS ° °Knee Rehabilitation, Guidelines Following Surgery  °Results after knee surgery are often greatly improved when you follow the exercise, range of motion and muscle strengthening exercises prescribed by your doctor. Safety measures are also important to protect the knee from further injury. Any time any of these exercises cause you to have increased pain or swelling in your knee joint, decrease the amount until you are comfortable again and slowly increase them. If you have problems or questions, call your caregiver or physical therapist for advice.  ° °HOME CARE INSTRUCTIONS  °• Remove items at home which could result in a fall. This includes throw rugs or furniture in walking pathways.  °· ICE to the affected knee every three hours for 30 minutes at a time and then as needed for pain and swelling.  Continue to use ice on the knee for pain and swelling from surgery. You may notice swelling that will progress down to the foot and ankle.  This is normal after surgery.  Elevate the leg when you are not up walking on it.   °· Continue to use the breathing machine which will help keep your temperature down.  It is common for your temperature to cycle up and down following surgery, especially at night when you are not up moving around and exerting yourself.  The breathing machine keeps your lungs expanded and your temperature down. °· Do not place pillow under knee, focus on keeping the knee straight while resting ° °DIET °You may resume your previous home diet once your are discharged from the hospital. ° °DRESSING / WOUND CARE / SHOWERING °You may shower 3 days after surgery, but keep the wounds dry during showering.  You may use an occlusive plastic wrap (Press'n Seal for example), NO SOAKING/SUBMERGING IN THE BATHTUB.  If the bandage  gets wet, change with a clean dry gauze.  If the incision gets wet, pat the wound dry with a clean towel. °You may start showering once you are discharged home but do not submerge the incision under water. Just pat the incision dry and apply a dry gauze dressing on daily. °Change the surgical dressing daily and reapply a dry dressing each time. ° °ACTIVITY °Walk with your walker as instructed. °Use walker as long as suggested by your caregivers. °Avoid periods of inactivity such as sitting longer than an hour when not asleep. This helps prevent blood clots.  °You may resume a sexual relationship in one month or when given the OK by your doctor.  °You may return to work once you are cleared by your doctor.  °Do not drive a car for 6 weeks or until released by you surgeon.  °Do not drive while taking narcotics. ° °WEIGHT BEARING °Weight bearing as tolerated with assist device (walker, cane, etc) as directed, use it as long as suggested by your surgeon or therapist, typically at least 4-6 weeks. ° °POSTOPERATIVE CONSTIPATION PROTOCOL °Constipation - defined medically as fewer than three stools per week and severe constipation as less than one stool per week. ° °One of the most common issues patients have following surgery is constipation.  Even if you have a regular bowel pattern at home, your normal regimen is likely to be disrupted due to multiple reasons following surgery.  Combination of anesthesia, postoperative narcotics, change in appetite and fluid intake all can affect your bowels.    In order to avoid complications following surgery, here are some recommendations in order to help you during your recovery period. ° °Colace (docusate) - Pick up an over-the-counter form of Colace or another stool softener and take twice a day as long as you are requiring postoperative pain medications.  Take with a full glass of water daily.  If you experience loose stools or diarrhea, hold the colace until you stool forms back  up.  If your symptoms do not get better within 1 week or if they get worse, check with your doctor. ° °Dulcolax (bisacodyl) - Pick up over-the-counter and take as directed by the product packaging as needed to assist with the movement of your bowels.  Take with a full glass of water.  Use this product as needed if not relieved by Colace only.  ° °MiraLax (polyethylene glycol) - Pick up over-the-counter to have on hand.  MiraLax is a solution that will increase the amount of water in your bowels to assist with bowel movements.  Take as directed and can mix with a glass of water, juice, soda, coffee, or tea.  Take if you go more than two days without a movement. °Do not use MiraLax more than once per day. Call your doctor if you are still constipated or irregular after using this medication for 7 days in a row. ° °If you continue to have problems with postoperative constipation, please contact the office for further assistance and recommendations.  If you experience "the worst abdominal pain ever" or develop nausea or vomiting, please contact the office immediatly for further recommendations for treatment. ° °ITCHING ° If you experience itching with your medications, try taking only a single pain pill, or even half a pain pill at a time.  You can also use Benadryl over the counter for itching or also to help with sleep.  ° °TED HOSE STOCKINGS °Wear the elastic stockings on both legs for three weeks following surgery during the day but you may remove then at night for sleeping. ° °MEDICATIONS °See your medication summary on the “After Visit Summary” that the nursing staff will review with you prior to discharge.  You may have some home medications which will be placed on hold until you complete the course of blood thinner medication.  It is important for you to complete the blood thinner medication as prescribed by your surgeon.  Continue your approved medications as instructed at time of discharge. ° °PRECAUTIONS °If  you experience chest pain or shortness of breath - call 911 immediately for transfer to the hospital emergency department.  °If you develop a fever greater that 101 F, purulent drainage from wound, increased redness or drainage from wound, foul odor from the wound/dressing, or calf pain - CONTACT YOUR SURGEON.   °                                                °FOLLOW-UP APPOINTMENTS °Make sure you keep all of your appointments after your operation with your surgeon and caregivers. You should call the office at the above phone number and make an appointment for approximately two weeks after the date of your surgery or on the date instructed by your surgeon outlined in the "After Visit Summary". ° ° °RANGE OF MOTION AND STRENGTHENING EXERCISES  °Rehabilitation of the knee is important following a knee injury or   an operation. After just a few days of immobilization, the muscles of the thigh which control the knee become weakened and shrink (atrophy). Knee exercises are designed to build up the tone and strength of the thigh muscles and to improve knee motion. Often times heat used for twenty to thirty minutes before working out will loosen up your tissues and help with improving the range of motion but do not use heat for the first two weeks following surgery. These exercises can be done on a training (exercise) mat, on the floor, on a table or on a bed. Use what ever works the best and is most comfortable for you Knee exercises include:  °• Leg Lifts - While your knee is still immobilized in a splint or cast, you can do straight leg raises. Lift the leg to 60 degrees, hold for 3 sec, and slowly lower the leg. Repeat 10-20 times 2-3 times daily. Perform this exercise against resistance later as your knee gets better.  °• Quad and Hamstring Sets - Tighten up the muscle on the front of the thigh (Quad) and hold for 5-10 sec. Repeat this 10-20 times hourly. Hamstring sets are done by pushing the foot backward against an  object and holding for 5-10 sec. Repeat as with quad sets.  °· Leg Slides: Lying on your back, slowly slide your foot toward your buttocks, bending your knee up off the floor (only go as far as is comfortable). Then slowly slide your foot back down until your leg is flat on the floor again. °· Angel Wings: Lying on your back spread your legs to the side as far apart as you can without causing discomfort.  °A rehabilitation program following serious knee injuries can speed recovery and prevent re-injury in the future due to weakened muscles. Contact your doctor or a physical therapist for more information on knee rehabilitation.  ° °IF YOU ARE TRANSFERRED TO A SKILLED REHAB FACILITY °If the patient is transferred to a skilled rehab facility following release from the hospital, a list of the current medications will be sent to the facility for the patient to continue.  When discharged from the skilled rehab facility, please have the facility set up the patient's Home Health Physical Therapy prior to being released. Also, the skilled facility will be responsible for providing the patient with their medications at time of release from the facility to include their pain medication, the muscle relaxants, and their blood thinner medication. If the patient is still at the rehab facility at time of the two week follow up appointment, the skilled rehab facility will also need to assist the patient in arranging follow up appointment in our office and any transportation needs. ° °MAKE SURE YOU:  °• Understand these instructions.  °• Get help right away if you are not doing well or get worse.  ° ° °Pick up stool softner and laxative for home use following surgery while on pain medications. °Do not submerge incision under water. °Please use good hand washing techniques while changing dressing each day. °May shower starting three days after surgery. °Please use a clean towel to pat the incision dry following showers. °Continue to  use ice for pain and swelling after surgery. °Do not use any lotions or creams on the incision until instructed by your surgeon. ° °

## 2018-05-21 NOTE — Transfer of Care (Signed)
Immediate Anesthesia Transfer of Care Note  Patient: Andre Martinez  Procedure(s) Performed: LEFT TOTAL KNEE ARTHROPLASTY (Left Knee)  Patient Location: PACU  Anesthesia Type:Spinal and MAC combined with regional for post-op pain  Level of Consciousness: awake, drowsy and responds to stimulation  Airway & Oxygen Therapy: Patient Spontanous Breathing and Patient connected to face mask oxygen  Post-op Assessment: Report given to RN and Post -op Vital signs reviewed and stable  Post vital signs: Reviewed and stable  Last Vitals:  Vitals Value Taken Time  BP 94/71 05/21/2018 10:37 AM  Temp    Pulse 69 05/21/2018 10:39 AM  Resp 14 05/21/2018 10:39 AM  SpO2 100 % 05/21/2018 10:39 AM  Vitals shown include unvalidated device data.  Last Pain:  Vitals:   05/21/18 0857  TempSrc:   PainSc: 0-No pain         Complications: No apparent anesthesia complications

## 2018-05-21 NOTE — Anesthesia Procedure Notes (Addendum)
Anesthesia Regional Block: Adductor canal block   Pre-Anesthetic Checklist: ,, timeout performed, Correct Patient, Correct Site, Correct Laterality, Correct Procedure, Correct Position, site marked, Risks and benefits discussed,  Surgical consent,  Pre-op evaluation,  At surgeon's request and post-op pain management  Laterality: Left  Prep: chloraprep       Needles:  Injection technique: Single-shot  Needle Type: Stimiplex     Needle Length: 9cm      Additional Needles:   Procedures:,,,, ultrasound used (permanent image in chart),,,,  Narrative:  Start time: 05/21/2018 8:35 AM End time: 05/21/2018 8:46 AM Injection made incrementally with aspirations every 5 mL.  Performed by: Personally  Anesthesiologist: Myrtie Soman, MD  Additional Notes: Patient tolerated the procedure well without complications

## 2018-05-21 NOTE — Anesthesia Preprocedure Evaluation (Signed)
Anesthesia Evaluation  Patient identified by MRN, date of birth, ID band Patient awake    Reviewed: Allergy & Precautions, NPO status , Patient's Chart, lab work & pertinent test results  Airway Mallampati: II  TM Distance: >3 FB Neck ROM: Full    Dental no notable dental hx.    Pulmonary neg pulmonary ROS, former smoker,    Pulmonary exam normal breath sounds clear to auscultation       Cardiovascular hypertension, Pt. on medications and Pt. on home beta blockers Normal cardiovascular exam Rhythm:Regular Rate:Normal     Neuro/Psych negative neurological ROS  negative psych ROS   GI/Hepatic negative GI ROS, Neg liver ROS,   Endo/Other  negative endocrine ROS  Renal/GU negative Renal ROS  negative genitourinary   Musculoskeletal negative musculoskeletal ROS (+)   Abdominal   Peds negative pediatric ROS (+)  Hematology negative hematology ROS (+)   Anesthesia Other Findings   Reproductive/Obstetrics negative OB ROS                             Anesthesia Physical Anesthesia Plan  ASA: II  Anesthesia Plan: Spinal   Post-op Pain Management:  Regional for Post-op pain   Induction: Intravenous  PONV Risk Score and Plan:   Airway Management Planned: Simple Face Mask  Additional Equipment:   Intra-op Plan:   Post-operative Plan:   Informed Consent: I have reviewed the patients History and Physical, chart, labs and discussed the procedure including the risks, benefits and alternatives for the proposed anesthesia with the patient or authorized representative who has indicated his/her understanding and acceptance.   Dental advisory given  Plan Discussed with: CRNA and Surgeon  Anesthesia Plan Comments:         Anesthesia Quick Evaluation

## 2018-05-21 NOTE — Anesthesia Procedure Notes (Signed)
Spinal  Patient location during procedure: OR Start time: 05/21/2018 9:15 AM End time: 05/21/2018 9:18 AM Reason for block: at surgeon's request Staffing Resident/CRNA: Anne Fu, CRNA Performed: resident/CRNA  Preanesthetic Checklist Completed: patient identified, site marked, surgical consent, pre-op evaluation, timeout performed, IV checked, risks and benefits discussed and monitors and equipment checked Spinal Block Patient position: sitting Prep: DuraPrep Patient monitoring: heart rate, continuous pulse ox and blood pressure Approach: right paramedian Location: L2-3 Injection technique: single-shot Needle Needle type: Pencan  Needle gauge: 24 G Needle length: 9 cm Assessment Sensory level: T6 Additional Notes  Functioning IV was confirmed and monitors were applied. Expiration date of kit checked and confirmed. Sterile prep and drape, including hand hygiene and sterile gloves were used. The patient was positioned and the spine was prepped. The skin was anesthetized with lidocaine.  Free flow of clear CSF was obtained prior to injecting local anesthetic into the CSF X 1 attempt.  The spinal needle aspirated freely following injection.  The needle was carefully withdrawn. Patient tolerated procedure well, without complications. Loss of motor and sensory on exam post injection.

## 2018-05-22 ENCOUNTER — Encounter (HOSPITAL_COMMUNITY): Payer: Self-pay | Admitting: Orthopedic Surgery

## 2018-05-22 LAB — CBC
HCT: 42.9 % (ref 39.0–52.0)
HEMOGLOBIN: 13.9 g/dL (ref 13.0–17.0)
MCH: 30.2 pg (ref 26.0–34.0)
MCHC: 32.4 g/dL (ref 30.0–36.0)
MCV: 93.3 fL (ref 80.0–100.0)
Platelets: 212 10*3/uL (ref 150–400)
RBC: 4.6 MIL/uL (ref 4.22–5.81)
RDW: 12.5 % (ref 11.5–15.5)
WBC: 18.7 10*3/uL — ABNORMAL HIGH (ref 4.0–10.5)
nRBC: 0 % (ref 0.0–0.2)

## 2018-05-22 LAB — BASIC METABOLIC PANEL
Anion gap: 7 (ref 5–15)
BUN: 14 mg/dL (ref 8–23)
CALCIUM: 8.6 mg/dL — AB (ref 8.9–10.3)
CO2: 25 mmol/L (ref 22–32)
Chloride: 109 mmol/L (ref 98–111)
Creatinine, Ser: 0.91 mg/dL (ref 0.61–1.24)
GFR calc non Af Amer: >60 mL/min (ref >60)
Glucose, Bld: 118 mg/dL — ABNORMAL HIGH (ref 70–99)
Potassium: 4.5 mmol/L (ref 3.5–5.1)
Sodium: 141 mmol/L (ref 135–145)

## 2018-05-22 MED ORDER — TRAMADOL HCL 50 MG PO TABS: 50.0000 mg | ORAL_TABLET | Freq: Four times a day (QID) | ORAL | 0 refills | Status: DC | PRN

## 2018-05-22 MED ORDER — OXYCODONE HCL 5 MG PO TABS: 5.0000 mg | ORAL_TABLET | Freq: Four times a day (QID) | ORAL | 0 refills | Status: DC | PRN

## 2018-05-22 MED ORDER — METHOCARBAMOL 500 MG PO TABS: 500.0000 mg | ORAL_TABLET | Freq: Four times a day (QID) | ORAL | 0 refills | Status: DC | PRN

## 2018-05-22 MED ORDER — ASPIRIN 325 MG PO TBEC: 325.0000 mg | DELAYED_RELEASE_TABLET | Freq: Two times a day (BID) | ORAL | 0 refills | Status: AC

## 2018-05-22 MED ORDER — SODIUM CHLORIDE 0.9 % IV BOLUS
500.0000 mL | Freq: Once | INTRAVENOUS | Status: AC
  Administered 2018-05-22: 500 mL via INTRAVENOUS

## 2018-05-22 MED ORDER — GABAPENTIN 300 MG PO CAPS: 300.0000 mg | ORAL_CAPSULE | Freq: Three times a day (TID) | ORAL | 0 refills | Status: DC

## 2018-05-22 NOTE — Care Management Note (Signed)
Case Management Note  Patient Details  Name: NORRIN SHREFFLER MRN: [id number] Date of Birth: [birth year]  Subjective/Objective:  Spoke with patient at bedside. Confirmed plan for OP PT, already arranged. Has RW and 3n1. [phone]                  Action/Plan:   Expected Discharge Date:  05/22/18               Expected Discharge Plan:  OP Rehab  In-House Referral:  NA  Discharge planning Services  CM Consult  Post Acute Care Choice:  NA Choice offered to:  Patient, Spouse  DME Arranged:  N/A DME Agency:  NA  HH Arranged:  NA HH Agency:  NA  Status of Service:  Completed, signed off  If discussed at [place] of Stay Meetings, dates discussed:    Additional Comments:  Guadalupe Maple, RN 05/22/2018, 9:43 AM

## 2018-05-22 NOTE — Progress Notes (Signed)
Physical Therapy Treatment Patient Details Name: Andre Martinez MRN: [id number] DOB: 03/27/1956 Today's Date: 05/22/2018    History of Present Illness 63 yo male s/p L TKR on 05/21/18. PMH includes HLD, GERD, OA, arrythmia, HTN.     PT Comments    POD # 1 am session Assisted OOB to amb to bathroo when pt was static standing to void c/o increased dizziness.   General Gait Details: assisted with amb to bathroom then static standing to void when pt c/o increased dizziness.  "I don't feel right" Assisted to raised toilet seat 9quickest item) and called for assist.  BP was 81/53 with HR 64.  Assisted to recliner and reported to RN.  Follow Up Recommendations  Follow surgeon's recommendation for DC plan and follow-up therapies;Supervision for mobility/OOB     Equipment Recommendations  None recommended by PT    Recommendations for Other Services       Precautions / Restrictions Precautions Precautions: Fall Restrictions Weight Bearing Restrictions: No Other Position/Activity Restrictions: WBAT     Mobility  Bed Mobility Overal bed mobility: Needs Assistance Bed Mobility: Supine to Sit     Supine to sit: Min guard;HOB elevated     General bed mobility comments: Min guard for safety. Increased time and effort to perform.   Transfers Overall transfer level: Needs assistance Equipment used: Rolling walker (2 wheeled) Transfers: Sit to/from Stand Sit to Stand: Min guard;From elevated surface         General transfer comment: Min guard for safety. Verbal cuing for hand placement.   Ambulation/Gait Ambulation/Gait assistance: Supervision;Min guard Gait Distance (Feet): 8 Feet Assistive device: Rolling walker (2 wheeled) Gait Pattern/deviations: Step-to pattern;Decreased stance time - left;Decreased weight shift to left;Antalgic Gait velocity: decreased   General Gait Details: assisted with amb to bathroom then static standing to void when pt c/o increased dizziness.  "I  don't feel right" Assisted to raised toilet seat 9quickest item) and called for assist.  BP was 81/53 with HR 64.  Assisted to recliner and reported to RN.   Stairs             Wheelchair Mobility    Modified Rankin (Stroke Patients Only)       Balance                                            Cognition Arousal/Alertness: Awake/alert Behavior During Therapy: WFL for tasks assessed/performed Overall Cognitive Status: Within Functional Limits for tasks assessed                                        Exercises      General Comments        Pertinent Vitals/Pain Pain Assessment: 0-10 Pain Score: 3  Pain Location: L knee  Pain Descriptors / Indicators: Aching Pain Intervention(s): Monitored during session;Repositioned;Ice applied    Home Living                      Prior Function            PT Goals (current goals can now be found in the care plan section) Progress towards PT goals: Progressing toward goals    Frequency    7X/week      PT Plan Current plan remains  appropriate    Co-evaluation              AM-PAC PT "6 Clicks" Mobility   Outcome Measure  Help needed turning from your back to your side while in a flat bed without using bedrails?: A Little Help needed moving from lying on your back to sitting on the side of a flat bed without using bedrails?: A Little Help needed moving to and from a bed to a chair (including a wheelchair)?: A Little Help needed standing up from a chair using your arms (e.g., wheelchair or bedside chair)?: A Little Help needed to walk in hospital room?: A Little Help needed climbing 3-5 steps with a railing? : A Little 6 Click Score: 18    End of Session Equipment Utilized During Treatment: Gait belt Activity Tolerance: Other (comment)(Hypotensive) Patient left: in chair;with call bell/phone within reach;with family/visitor present Nurse Communication: Mobility  status PT Visit Diagnosis: Other abnormalities of gait and mobility (R26.89);Difficulty in walking, not elsewhere classified (R26.2)     Time: 1040-1055 PT Time Calculation (min) (ACUTE ONLY): 15 min  Charges:  $Gait Training: 8-22 mins                     Rica Koyanagi  PTA Acute  Rehabilitation Services Pager      [phone] Office      [phone]

## 2018-05-22 NOTE — Progress Notes (Signed)
   Subjective: 1 Day Post-Op Procedure(s) (LRB): LEFT TOTAL KNEE ARTHROPLASTY (Left) Patient reports pain as mild.   Patient seen in rounds by Dr. Wynelle Link. Patient is well, and has had no acute complaints or problems other than pain in the left knee. No issues overnight. Foley catheter to be removed this AM. Denies chest pain, SOB, or calf pain. We will continue therapy today.   Objective: Vital signs in last 24 hours: Temp:  [97.4 F (36.3 C)-97.9 F (36.6 C)] 97.4 F (36.3 C) (01/07 0558) Pulse Rate:  [53-79] 66 (01/07 0558) Resp:  [9-20] 18 (01/07 0558) BP: (94-145)/(68-100) 135/89 (01/07 0558) SpO2:  [95 %-100 %] 100 % (01/07 0558) Weight:  [93 kg] 93 kg (01/06 0759)  Intake/Output from previous day:  Intake/Output Summary (Last 24 hours) at 05/22/2018 0707 Last data filed at 05/22/2018 3704 Gross per 24 hour  Intake 5273.37 ml  Output 4255 ml  Net 1018.37 ml    Labs: Recent Labs    05/22/18 0419  HGB 13.9   Recent Labs    05/22/18 0419  WBC 18.7*  RBC 4.60  HCT 42.9  PLT 212   Recent Labs    05/22/18 0419  NA 141  K 4.5  CL 109  CO2 25  BUN 14  CREATININE 0.91  GLUCOSE 118*  CALCIUM 8.6*   Exam: General - Patient is Alert and Oriented Extremity - Neurologically intact Neurovascular intact Sensation intact distally Dorsiflexion/Plantar flexion intact Dressing - dressing C/D/I Motor Function - intact, moving foot and toes well on exam.   Past Medical History:  Diagnosis Date  . Allergy   . Arthritis   . GERD (gastroesophageal reflux disease)   . Hemorrhoids   . Hypertension   . Sinusitis 12/94 3/95    Assessment/Plan: 1 Day Post-Op Procedure(s) (LRB): LEFT TOTAL KNEE ARTHROPLASTY (Left) Principal Problem:   OA (osteoarthritis) of knee  Estimated body mass index is 26.32 kg/m as calculated from the following:   Height as of this encounter: 6\' 2"  (1.88 m).   Weight as of this encounter: 93 kg. Advance diet Up with therapy D/C IV  fluids  Anticipated LOS equal to or greater than 2 midnights due to - Age 3 and older with one or more of the following:  - Obesity  - Expected need for hospital services (PT, OT, Nursing) required for safe  discharge  - Anticipated need for postoperative skilled nursing care or inpatient rehab  - Active co-morbidities: None OR   - Unanticipated findings during/Post Surgery: None  - Patient is a high risk of re-admission due to: None    DVT Prophylaxis - Aspirin Weight bearing as tolerated. D/C O2 and pulse ox and try on room air. Hemovac pulled without difficulty, will continue therapy today.  Plan is to go Home after hospital stay. Plan for discharge to home after two sessions of therapy. Scheduled for outpatient PT at [facility]. Follow-up in the office in 2 weeks.  Theresa Duty, PA-C Orthopedic Surgery 05/22/2018, 7:07 AM

## 2018-05-22 NOTE — Progress Notes (Signed)
Physical Therapy Treatment Patient Details Name: Andre Martinez MRN: [id number] DOB: 02-Apr-1956 Today's Date: 05/22/2018    History of Present Illness 63 yo male s/p L TKR on 05/21/18. PMH includes HLD, GERD, OA, arrythmia, HTN.     PT Comments    POD # 1 pm session Pt feeling better after 500 bolus.  BP after amb 25 feet was 131/83 tolerated an increased distance in hallway, practiced stairs then Performed some TE's following HEP handout.  Instructed on proper tech, freq as well as use of ICE.  Addressed all mobility questions.  Pt ready for D/C to home.     Follow Up Recommendations  Follow surgeon's recommendation for DC plan and follow-up therapies;Supervision for mobility/OOB     Equipment Recommendations  None recommended by PT    Recommendations for Other Services       Precautions / Restrictions Precautions Precautions: Fall Restrictions Weight Bearing Restrictions: No Other Position/Activity Restrictions: WBAT     Mobility  Bed Mobility Overal bed mobility: Needs Assistance Bed Mobility: Supine to Sit     Supine to sit: Min guard;HOB elevated     General bed mobility comments: Min guard for safety. Increased time and effort to perform.   Transfers Overall transfer level: Needs assistance Equipment used: Rolling walker (2 wheeled) Transfers: Sit to/from Stand Sit to Stand: Min guard;From elevated surface         General transfer comment: Min guard for safety. Verbal cuing for hand placement.   Ambulation/Gait Ambulation/Gait assistance: Supervision;Min guard Gait Distance (Feet): 125 Feet Assistive device: Rolling walker (2 wheeled) Gait Pattern/deviations: Step-to pattern;Decreased stance time - left;Decreased weight shift to left;Antalgic Gait velocity: decreased   General Gait Details: assisted with amb to bathroom then static standing to void when pt c/o increased dizziness.  "I don't feel right" Assisted to raised toilet seat 9quickest item)  and called for assist.  BP was 81/53 with HR 64.  Assisted to recliner and reported to RN.   Stairs Stairs: Yes Stairs assistance: Min guard Stair Management: One rail Right;Step to pattern;Sideways Number of Stairs: 3 General stair comments: with spouse present for "hands on" assist   Wheelchair Mobility    Modified Rankin (Stroke Patients Only)       Balance                                            Cognition Arousal/Alertness: Awake/alert Behavior During Therapy: WFL for tasks assessed/performed Overall Cognitive Status: Within Functional Limits for tasks assessed                                        Exercises      General Comments        Pertinent Vitals/Pain Pain Assessment: 0-10 Pain Score: 3  Pain Location: L knee  Pain Descriptors / Indicators: Aching Pain Intervention(s): Monitored during session;Repositioned;Ice applied    Home Living                      Prior Function            PT Goals (current goals can now be found in the care plan section) Progress towards PT goals: Progressing toward goals    Frequency    7X/week  PT Plan Current plan remains appropriate    Co-evaluation              AM-PAC PT "6 Clicks" Mobility   Outcome Measure  Help needed turning from your back to your side while in a flat bed without using bedrails?: A Little Help needed moving from lying on your back to sitting on the side of a flat bed without using bedrails?: A Little Help needed moving to and from a bed to a chair (including a wheelchair)?: A Little Help needed standing up from a chair using your arms (e.g., wheelchair or bedside chair)?: A Little Help needed to walk in hospital room?: A Little Help needed climbing 3-5 steps with a railing? : A Little 6 Click Score: 18    End of Session Equipment Utilized During Treatment: Gait belt Activity Tolerance: Other (comment)(Hypotensive) Patient  left: in chair;with call bell/phone within reach;with family/visitor present Nurse Communication: Mobility status PT Visit Diagnosis: Other abnormalities of gait and mobility (R26.89);Difficulty in walking, not elsewhere classified (R26.2)     Time: 1324-4010 PT Time Calculation (min) (ACUTE ONLY): 25 min  Charges:  $Gait Training: 8-22 mins $Therapeutic Exercise: 8-22 mins                     Rica Koyanagi  PTA Acute  Rehabilitation Services Pager      [phone] Office      [phone]

## 2018-05-23 NOTE — Addendum Note (Signed)
Addendum  created 05/23/18 0827 by Myrtie Soman, MD   Clinical Note Signed, Intraprocedure Blocks edited

## 2018-05-23 NOTE — Discharge Summary (Signed)
Physician Discharge Summary   Patient ID: Andre Martinez MRN: [id number] DOB/AGE: 65-May-1957 65 y.o.  Admit date: 05/21/2018 Discharge date: 05/22/2018  Primary Diagnosis: Osteoarthritis, left knee   Admission Diagnoses:  Past Medical History:  Diagnosis Date  . Allergy   . Arthritis   . GERD (gastroesophageal reflux disease)   . Hemorrhoids   . Hypertension   . Sinusitis 12/94 3/95   Discharge Diagnoses:   Principal Problem:   OA (osteoarthritis) of knee  Estimated body mass index is 26.32 kg/m as calculated from the following:   Height as of this encounter: 6\' 2"  (1.88 m).   Weight as of this encounter: 93 kg.  Procedure:  Procedure(s) (LRB): LEFT TOTAL KNEE ARTHROPLASTY (Left)   Consults: None  HPI: Andre Martinez is a 63 y.o. year old male with end stage OA of his left knee with progressively worsening pain and dysfunction. He has constant pain, with activity and at rest and significant functional deficits with difficulties even with ADLs. He has had extensive non-op management including analgesics, injections of cortisone and viscosupplements, and home exercise program, but remains in significant pain with significant dysfunction. Radiographs show bone on bone arthritis medial and patellofemoral. He presents now for left Total Knee Arthroplasty.     Laboratory Data: Admission on 05/21/2018, Discharged on 05/22/2018  Component Date Value Ref Range Status  . WBC 05/22/2018 18.7* 4.0 - 10.5 K/uL Final  . RBC 05/22/2018 4.60  4.22 - 5.81 MIL/uL Final  . Hemoglobin 05/22/2018 13.9  13.0 - 17.0 g/dL Final  . HCT 05/22/2018 42.9  39.0 - 52.0 % Final  . MCV 05/22/2018 93.3  80.0 - 100.0 fL Final  . MCH 05/22/2018 30.2  26.0 - 34.0 pg Final  . MCHC 05/22/2018 32.4  30.0 - 36.0 g/dL Final  . RDW 05/22/2018 12.5  11.5 - 15.5 % Final  . Platelets 05/22/2018 212  150 - 400 K/uL Final  . nRBC 05/22/2018 0.0  0.0 - 0.2 % Final   Performed at [facility],  [place] [address], [place], [place] [address]  . Sodium 05/22/2018 141  135 - 145 mmol/L Final  . Potassium 05/22/2018 4.5  3.5 - 5.1 mmol/L Final  . Chloride 05/22/2018 109  98 - 111 mmol/L Final  . CO2 05/22/2018 25  22 - 32 mmol/L Final  . Glucose, Bld 05/22/2018 118* 70 - 99 mg/dL Final  . BUN 05/22/2018 14  8 - 23 mg/dL Final  . Creatinine, Ser 05/22/2018 0.91  0.61 - 1.24 mg/dL Final  . Calcium 05/22/2018 8.6* 8.9 - 10.3 mg/dL Final  . GFR calc non Af Amer 05/22/2018 >60  >60 mL/min Final  . GFR calc Af Amer 05/22/2018 >60  >60 mL/min Final  . Anion gap 05/22/2018 7  5 - 15 Final   Performed at [facility], [place] [address], [place], [place] [address]  Hospital Outpatient Visit on 05/17/2018  Component Date Value Ref Range Status  . aPTT 05/17/2018 38* 24 - 36 seconds Final   Comment:        IF BASELINE aPTT IS ELEVATED, SUGGEST PATIENT RISK ASSESSMENT BE USED TO DETERMINE APPROPRIATE ANTICOAGULANT THERAPY. Performed at [facility], [place] [address] [place], [place] [address]   . WBC 05/17/2018 7.0  4.0 - 10.5 K/uL Final  . RBC 05/17/2018 5.14  4.22 - 5.81 MIL/uL Final  . Hemoglobin 05/17/2018 15.3  13.0 - 17.0 g/dL Final  . HCT 05/17/2018 47.3  39.0 - 52.0 % Final  . MCV 05/17/2018 92.0  80.0 - 100.0 fL Final  . MCH 05/17/2018 29.8  26.0 - 34.0 pg Final  . MCHC 05/17/2018 32.3  30.0 - 36.0 g/dL Final  . RDW 05/17/2018 12.6  11.5 - 15.5 % Final  . Platelets 05/17/2018 217  150 - 400 K/uL Final  . nRBC 05/17/2018 0.0  0.0 - 0.2 % Final   Performed at [facility], [place] [address] [place], [place] [address]  . Sodium 05/17/2018 139  135 - 145 mmol/L Final  . Potassium 05/17/2018 4.3  3.5 - 5.1 mmol/L Final  . Chloride 05/17/2018 107  98 - 111 mmol/L Final  . CO2 05/17/2018 23  22 - 32 mmol/L Final  . Glucose, Bld 05/17/2018 97  70 - 99 mg/dL Final  . BUN 05/17/2018 19  8 - 23 mg/dL Final  . Creatinine, Ser 05/17/2018  0.86  0.61 - 1.24 mg/dL Final  . Calcium 05/17/2018 8.8* 8.9 - 10.3 mg/dL Final  . Total Protein 05/17/2018 6.7  6.5 - 8.1 g/dL Final  . Albumin 05/17/2018 3.8  3.5 - 5.0 g/dL Final  . AST 05/17/2018 23  15 - 41 U/L Final  . ALT 05/17/2018 23  0 - 44 U/L Final  . Alkaline Phosphatase 05/17/2018 63  38 - 126 U/L Final  . Total Bilirubin 05/17/2018 0.6  0.3 - 1.2 mg/dL Final  . GFR calc non Af Amer 05/17/2018 >60  >60 mL/min Final  . GFR calc Af Amer 05/17/2018 >60  >60 mL/min Final  . Anion gap 05/17/2018 9  5 - 15 Final   Performed at [facility], [place] [address] [place], [place] [address]  . Prothrombin Time 05/17/2018 11.9  11.4 - 15.2 seconds Final  . INR 05/17/2018 0.89   Final   Performed at [facility], [place] [address], [place], [place] [address]  . ABO/RH(D) 05/17/2018 O POS   Final  . Antibody Screen 05/17/2018 NEG   Final  . Sample Expiration 05/17/2018 05/24/2018   Final  . Extend sample reason 05/17/2018    Final                   Value:NO TRANSFUSIONS OR PREGNANCY IN THE PAST 3 MONTHS Performed at [facility], [place] [address], [place], [place] [address]   . MRSA, PCR 05/17/2018 NEGATIVE  NEGATIVE Final  . Staphylococcus aureus 05/17/2018 NEGATIVE  NEGATIVE Final   Comment: (NOTE) The Xpert SA Assay (FDA approved for NASAL specimens in patients 66 years of age and older), is one component of a comprehensive surveillance program. It is not intended to diagnose infection nor to guide or monitor treatment. Performed at [facility], [place] [address] [place],  [address]   . ABO/RH(D) 05/17/2018    Final                   Value:O POS Performed at [facility], [place] [address], [place],  [address]      X-Rays:No results found.  EKG: Orders placed or performed in visit on 03/06/18  . EKG 12-Lead     Hospital Course: Andre Martinez is a 63 y.o. who was admitted  to [facility]. Andre Martinez were brought to the operating room on 05/21/2018 and underwent Procedure(s): LEFT TOTAL KNEE ARTHROPLASTY.  Patient tolerated the procedure well and was later transferred to the recovery room and then to  the orthopaedic floor for postoperative care. Andre Martinez were given PO and IV analgesics for pain control following their surgery. Andre Martinez were given 24 hours of postoperative antibiotics of  Anti-infectives (From admission, onward)   Start     Dose/Rate Route Frequency Ordered Stop   05/21/18 1530  ceFAZolin (ANCEF) IVPB 2g/100 mL premix     2 g 200 mL/hr over 30 Minutes Intravenous Every 6 hours 05/21/18 1253 05/21/18 2107   05/21/18 0730  ceFAZolin (ANCEF) IVPB 2g/100 mL premix     2 g 200 mL/hr over 30 Minutes Intravenous On call to O.R. 05/21/18 6503 05/21/18 0919     and started on DVT prophylaxis in the form of Aspirin.   PT and OT were ordered for total joint protocol. Discharge planning consulted to help with postop disposition and equipment needs.  Patient had a good night on the evening of surgery. Andre Martinez started to get up OOB with therapy on POD #0. Pt was seen during rounds and was ready to go home pending progress with therapy. Hemovac drain was pulled without difficulty. He worked with therapy on POD #1 and was meeting his goals. Pt was discharged to home later that day in stable condition.  Diet: Regular diet Activity: WBAT Follow-up: in 2 weeks with Dr. Wynelle Link Disposition: Home with outpatient physical therapy at [facility] Discharged Condition: stable   Discharge Instructions    Call MD / Call 911   Complete by:  As directed    If you experience chest pain or shortness of breath, CALL 911 and be transported to the hospital emergency room.  If you develope a fever above 101 F, pus (white drainage) or increased drainage or redness at the wound, or calf pain, call your surgeon's office.   Change dressing   Complete by:  As directed    Change dressing on  Wednesday, then change the dressing daily with sterile 4 x 4 inch gauze dressing and apply TED hose.   Constipation Prevention   Complete by:  As directed    Drink plenty of fluids.  Prune juice may be helpful.  You may use a stool softener, such as Colace (over the counter) 100 mg twice a day.  Use MiraLax (over the counter) for constipation as needed.   Diet - low sodium heart healthy   Complete by:  As directed    Discharge instructions   Complete by:  As directed    Dr. Gaynelle Arabian Total Joint Specialist [facility] [address], [place], [place] [address] [phone]  TOTAL KNEE REPLACEMENT POSTOPERATIVE DIRECTIONS  Knee Rehabilitation, Guidelines Following Surgery  Results after knee surgery are often greatly improved when you follow the exercise, range of motion and muscle strengthening exercises prescribed by your doctor. Safety measures are also important to protect the knee from further injury. Any time any of these exercises cause you to have increased pain or swelling in your knee joint, decrease the amount until you are comfortable again and slowly increase them. If you have problems or questions, call your caregiver or physical therapist for advice.   HOME CARE INSTRUCTIONS  Remove items at home which could result in a fall. This includes throw rugs or furniture in walking pathways.  ICE to the affected knee every three hours for 30 minutes at a time and then as needed for pain and swelling.  Continue to use ice on the knee for pain and swelling from surgery. You may notice swelling that will progress down to  the foot and ankle.  This is normal after surgery.  Elevate the leg when you are not up walking on it.   Continue to use the breathing machine which will help keep your temperature down.  It is common for your temperature to cycle up and down following surgery, especially at night when you are not up moving around and exerting yourself.  The breathing  machine keeps your lungs expanded and your temperature down. Do not place pillow under knee, focus on keeping the knee straight while resting   DIET You may resume your previous home diet once your are discharged from the hospital.  DRESSING / WOUND CARE / SHOWERING You may shower 3 days after surgery, but keep the wounds dry during showering.  You may use an occlusive plastic wrap (Press'n Seal for example), NO SOAKING/SUBMERGING IN THE BATHTUB.  If the bandage gets wet, change with a clean dry gauze.  If the incision gets wet, pat the wound dry with a clean towel. You may start showering once you are discharged home but do not submerge the incision under water. Just pat the incision dry and apply a dry gauze dressing on daily. Change the surgical dressing daily and reapply a dry dressing each time.  ACTIVITY Walk with your walker as instructed. Use walker as long as suggested by your caregivers. Avoid periods of inactivity such as sitting longer than an hour when not asleep. This helps prevent blood clots.  You may resume a sexual relationship in one month or when given the OK by your doctor.  You may return to work once you are cleared by your doctor.  Do not drive a car for 6 weeks or until released by you surgeon.  Do not drive while taking narcotics.  WEIGHT BEARING Weight bearing as tolerated with assist device (walker, cane, etc) as directed, use it as long as suggested by your surgeon or therapist, typically at least 4-6 weeks.  POSTOPERATIVE CONSTIPATION PROTOCOL Constipation - defined medically as fewer than three stools per week and severe constipation as less than one stool per week.  One of the most common issues patients have following surgery is constipation.  Even if you have a regular bowel pattern at home, your normal regimen is likely to be disrupted due to multiple reasons following surgery.  Combination of anesthesia, postoperative narcotics, change in appetite and  fluid intake all can affect your bowels.  In order to avoid complications following surgery, here are some recommendations in order to help you during your recovery period.  Colace (docusate) - Pick up an over-the-counter form of Colace or another stool softener and take twice a day as long as you are requiring postoperative pain medications.  Take with a full glass of water daily.  If you experience loose stools or diarrhea, hold the colace until you stool forms back up.  If your symptoms do not get better within 1 week or if Andre Martinez get worse, check with your doctor.  Dulcolax (bisacodyl) - Pick up over-the-counter and take as directed by the product packaging as needed to assist with the movement of your bowels.  Take with a full glass of water.  Use this product as needed if not relieved by Colace only.   MiraLax (polyethylene glycol) - Pick up over-the-counter to have on hand.  MiraLax is a solution that will increase the amount of water in your bowels to assist with bowel movements.  Take as directed and can mix with a glass of  water, juice, soda, coffee, or tea.  Take if you go more than two days without a movement. Do not use MiraLax more than once per day. Call your doctor if you are still constipated or irregular after using this medication for 7 days in a row.  If you continue to have problems with postoperative constipation, please contact the office for further assistance and recommendations.  If you experience "the worst abdominal pain ever" or develop nausea or vomiting, please contact the office immediatly for further recommendations for treatment.  ITCHING  If you experience itching with your medications, try taking only a single pain pill, or even half a pain pill at a time.  You can also use Benadryl over the counter for itching or also to help with sleep.   TED HOSE STOCKINGS Wear the elastic stockings on both legs for three weeks following surgery during the day but you may remove  then at night for sleeping.  MEDICATIONS See your medication summary on the "After Visit Summary" that the nursing staff will review with you prior to discharge.  You may have some home medications which will be placed on hold until you complete the course of blood thinner medication.  It is important for you to complete the blood thinner medication as prescribed by your surgeon.  Continue your approved medications as instructed at time of discharge.  PRECAUTIONS If you experience chest pain or shortness of breath - call 911 immediately for transfer to the hospital emergency department.  If you develop a fever greater that 101 F, purulent drainage from wound, increased redness or drainage from wound, foul odor from the wound/dressing, or calf pain - CONTACT YOUR SURGEON.                                                   FOLLOW-UP APPOINTMENTS Make sure you keep all of your appointments after your operation with your surgeon and caregivers. You should call the office at the above phone number and make an appointment for approximately two weeks after the date of your surgery or on the date instructed by your surgeon outlined in the "After Visit Summary".   RANGE OF MOTION AND STRENGTHENING EXERCISES  Rehabilitation of the knee is important following a knee injury or an operation. After just a few days of immobilization, the muscles of the thigh which control the knee become weakened and shrink (atrophy). Knee exercises are designed to build up the tone and strength of the thigh muscles and to improve knee motion. Often times heat used for twenty to thirty minutes before working out will loosen up your tissues and help with improving the range of motion but do not use heat for the first two weeks following surgery. These exercises can be done on a training (exercise) mat, on the floor, on a table or on a bed. Use what ever works the best and is most comfortable for you Knee exercises include:  Leg Lifts  - While your knee is still immobilized in a splint or cast, you can do straight leg raises. Lift the leg to 60 degrees, hold for 3 sec, and slowly lower the leg. Repeat 10-20 times 2-3 times daily. Perform this exercise against resistance later as your knee gets better.  Quad and Hamstring Sets - Tighten up the muscle on the front of the thigh (  Quad) and hold for 5-10 sec. Repeat this 10-20 times hourly. Hamstring sets are done by pushing the foot backward against an object and holding for 5-10 sec. Repeat as with quad sets.  Leg Slides: Lying on your back, slowly slide your foot toward your buttocks, bending your knee up off the floor (only go as far as is comfortable). Then slowly slide your foot back down until your leg is flat on the floor again. Angel Wings: Lying on your back spread your legs to the side as far apart as you can without causing discomfort.  A rehabilitation program following serious knee injuries can speed recovery and prevent re-injury in the future due to weakened muscles. Contact your doctor or a physical therapist for more information on knee rehabilitation.   IF YOU ARE TRANSFERRED TO A SKILLED REHAB FACILITY If the patient is transferred to a skilled rehab facility following release from the hospital, a list of the current medications will be sent to the facility for the patient to continue.  When discharged from the skilled rehab facility, please have the facility set up the patient's [place] prior to being released. Also, the skilled facility will be responsible for providing the patient with their medications at time of release from the facility to include their pain medication, the muscle relaxants, and their blood thinner medication. If the patient is still at the rehab facility at time of the two week follow up appointment, the skilled rehab facility will also need to assist the patient in arranging follow up appointment in our office and any  transportation needs.  MAKE SURE YOU:  Understand these instructions.  Get help right away if you are not doing well or get worse.    Pick up stool softner and laxative for home use following surgery while on pain medications. Do not submerge incision under water. Please use good hand washing techniques while changing dressing each day. May shower starting three days after surgery. Please use a clean towel to pat the incision dry following showers. Continue to use ice for pain and swelling after surgery. Do not use any lotions or creams on the incision until instructed by your surgeon.   Do not put a pillow under the knee. Place it under the heel.   Complete by:  As directed    Driving restrictions   Complete by:  As directed    No driving for two weeks   TED hose   Complete by:  As directed    Use stockings (TED hose) for three weeks on both leg(s).  You may remove them at night for sleeping.   Weight bearing as tolerated   Complete by:  As directed      Allergies as of 05/22/2018      Reactions   Codeine Nausea Only   Penicillins Rash   DID THE REACTION INVOLVE: Swelling of the face/tongue/throat, SOB, or low BP? No Sudden or severe rash/hives, skin peeling, or the inside of the mouth or nose? No Did it require medical treatment? No When did it last happen?Childhood allergy If all above answers are "NO", may proceed with cephalosporin use.      Medication List    STOP taking these medications   aspirin 81 MG tablet Replaced by:  aspirin 325 MG EC tablet     TAKE these medications   aspirin 325 MG EC tablet Take 1 tablet (325 mg total) by mouth 2 (two) times daily for 20 days. Then resume  one 81 mg aspirin once a day. Replaces:  aspirin 81 MG tablet   famotidine 20 MG tablet Commonly known as:  PEPCID Take 20 mg by mouth daily as needed for heartburn or indigestion.   gabapentin 300 MG capsule Commonly known as:  NEURONTIN Take 1 capsule (300 mg total) by  mouth 3 (three) times daily.   methocarbamol 500 MG tablet Commonly known as:  ROBAXIN Take 1 tablet (500 mg total) by mouth every 6 (six) hours as needed for muscle spasms.   metoprolol succinate 50 MG 24 hr tablet Commonly known as:  TOPROL-XL Take 1 tablet (50 mg total) by mouth daily. Take with or immediately following a meal.   oxyCODONE 5 MG immediate release tablet Commonly known as:  Oxy IR/ROXICODONE Take 1-2 tablets (5-10 mg total) by mouth every 6 (six) hours as needed for severe pain.   traMADol 50 MG tablet Commonly known as:  ULTRAM Take 1-2 tablets (50-100 mg total) by mouth every 6 (six) hours as needed for moderate pain.            Discharge Care Instructions  (From admission, onward)         Start     Ordered   05/22/18 0000  Weight bearing as tolerated     05/22/18 0711   05/22/18 0000  Change dressing    Comments:  Change dressing on Wednesday, then change the dressing daily with sterile 4 x 4 inch gauze dressing and apply TED hose.   05/22/18 1610         Follow-up Information    Gaynelle Arabian, MD. Schedule an appointment as soon as possible for a visit on 06/05/2018.   Specialty:  Orthopedic Surgery Contact information: [address] [place] [address] [phone]           Signed: Theresa Duty, PA-C Orthopedic Surgery 05/23/2018, 11:50 AM

## 2018-07-25 HISTORY — PX: SKIN BIOPSY: SHX1

## 2018-11-04 ENCOUNTER — Encounter: Payer: Self-pay | Admitting: Family Medicine

## 2019-04-10 ENCOUNTER — Other Ambulatory Visit: Payer: Self-pay

## 2019-04-10 ENCOUNTER — Encounter: Payer: Self-pay | Admitting: Family Medicine

## 2019-04-10 ENCOUNTER — Ambulatory Visit (INDEPENDENT_AMBULATORY_CARE_PROVIDER_SITE_OTHER): Payer: Medicare Other | Admitting: Family Medicine

## 2019-04-10 VITALS — BP 138/92 | HR 68 | Temp 96.8°F | Ht 74.0 in | Wt 199.6 lb

## 2019-04-10 DIAGNOSIS — Z125 Encounter for screening for malignant neoplasm of prostate: Secondary | ICD-10-CM

## 2019-04-10 DIAGNOSIS — M1990 Unspecified osteoarthritis, unspecified site: Secondary | ICD-10-CM | POA: Diagnosis not present

## 2019-04-10 DIAGNOSIS — E785 Hyperlipidemia, unspecified: Secondary | ICD-10-CM

## 2019-04-10 DIAGNOSIS — K219 Gastro-esophageal reflux disease without esophagitis: Secondary | ICD-10-CM | POA: Diagnosis not present

## 2019-04-10 DIAGNOSIS — Z0000 Encounter for general adult medical examination without abnormal findings: Secondary | ICD-10-CM | POA: Diagnosis not present

## 2019-04-10 DIAGNOSIS — J301 Allergic rhinitis due to pollen: Secondary | ICD-10-CM

## 2019-04-10 DIAGNOSIS — Z23 Encounter for immunization: Secondary | ICD-10-CM | POA: Diagnosis not present

## 2019-04-10 DIAGNOSIS — Z136 Encounter for screening for cardiovascular disorders: Secondary | ICD-10-CM

## 2019-04-10 LAB — POCT URINALYSIS DIP (PROADVANTAGE DEVICE)
Bilirubin, UA: NEGATIVE
Glucose, UA: NEGATIVE mg/dL
Ketones, POC UA: NEGATIVE mg/dL
Leukocytes, UA: NEGATIVE
Nitrite, UA: NEGATIVE
Protein Ur, POC: NEGATIVE mg/dL
Specific Gravity, Urine: 1.025
Urobilinogen, Ur: 0.2
pH, UA: 6 (ref 5.0–8.0)

## 2019-04-10 NOTE — Progress Notes (Signed)
Andre Martinez is a 63 y.o. male who presents for annual wellness visit and follow-up on chronic medical conditions.  He has no particular concerns or questions.  He did have TKR in January and is doing quite nicely after that.  He is quite physically active.  He does not complain of any other arthritic type symptoms at the present time.  He also does have some slight difficulty with reflux disease and does use Tums with good results on an as-needed basis.  Allergies are not causing any trouble.   Immunizations and Health Maintenance Immunization History  Administered Date(s) Administered  . Influenza Split 02/19/2013  . Influenza, Quadrivalent, Recombinant, Inj, Pf 02/19/2019  . Influenza,inj,Quad PF,6+ Mos 01/16/2014  . Influenza-Unspecified 03/15/2016, 03/28/2017, 02/08/2018, 02/19/2019  . Pneumococcal Conjugate-13 04/10/2019  . Tdap 04/02/2001, 10/18/2011  . Zoster 04/21/2014  . Zoster Recombinat (Shingrix) 03/06/2018, 05/07/2018   Health Maintenance Due  Topic Date Due  . PNA vac Low Risk Adult (1 of 2 - PCV13) 02/24/2019    Last colonoscopy:02-25-11 Last PSA: 10-29-12 Dentist: Q six months Ophtho: yearly 2020 Exercise: walking QD  Other doctors caring for patient include: Dr. Mare Ferrari   Advanced Directives: Not on file ;info given Does Patient Have a Medical Advance Directive?: Yes Type of Advance Directive: [place] will Does patient want to make changes to medical advance directive?: No - Patient declined Copy of [place] in Chart?: No - copy requested  Depression screen:  See questionnaire below.     Depression screen [facility] 2/9 04/10/2019 03/06/2018 12/02/2016 11/20/2015 01/16/2014  Decreased Interest 0 0 0 0 0  Down, Depressed, Hopeless 0 0 0 0 0  PHQ - 2 Score 0 0 0 0 0    Fall Screen: See Questionaire below.   Fall Risk  04/10/2019 03/06/2018 12/02/2016 11/20/2015  Falls in the past year? 0 No No No    ADL screen:   See questionnaire below.  Functional Status Survey: Is the patient deaf or have difficulty hearing?: No Does the patient have difficulty seeing, even when wearing glasses/contacts?: No Does the patient have difficulty concentrating, remembering, or making decisions?: No Does the patient have difficulty walking or climbing stairs?: No Does the patient have difficulty dressing or bathing?: No Does the patient have difficulty doing errands alone such as visiting a doctor's office or shopping?: No   Review of Systems  Constitutional: -, -unexpected weight change, -anorexia, -fatigue Allergy: -sneezing, -itching, -congestion Dermatology: denies changing moles, rash, lumps ENT: -runny nose, -ear pain, -sore throat,  Cardiology:  -chest pain, -palpitations, -orthopnea, Respiratory: -cough, -shortness of breath, -dyspnea on exertion, -wheezing,  Gastroenterology: -abdominal pain, -nausea, -vomiting, -diarrhea, -constipation, -dysphagia Hematology: -bleeding or bruising problems Musculoskeletal: -arthralgias, -myalgias, -joint swelling, -back pain, - Ophthalmology: -vision changes,  Urology: -dysuria, -difficulty urinating,  -urinary frequency, -urgency, incontinence Neurology: -, -numbness, , -memory loss, -falls, -dizziness    PHYSICAL EXAM:  BP (!) 138/92 (BP Location: Left Arm, Patient Position: Sitting)   Pulse 68   Temp (!) 96.8 F (36 C)   Ht 6\' 2"  (1.88 m)   Wt 199 lb 9.6 oz (90.5 kg)   SpO2 96%   BMI 25.63 kg/m   General Appearance: Alert, cooperative, no distress, appears stated age Head: Normocephalic, without obvious abnormality, atraumatic Eyes: PERRL, conjunctiva/corneas clear, EOM's intact, Ears: Normal TM's and external ear canals Nose: Nares normal, mucosa normal, no drainage or sinus   tenderness Throat: Lips, mucosa, and tongue normal; teeth  and gums normal Neck: Supple, no lymphadenopathy, thyroid:no enlargement/tenderness/nodules; no carotid bruit or  JVD Lungs: Clear to auscultation bilaterally without wheezes, rales or ronchi; respirations unlabored Heart: Regular rate and rhythm, S1 and S2 normal, no murmur, rub or gallop Abdomen: Soft, non-tender, nondistended, normoactive bowel sounds, no masses, no hepatosplenomegaly Extremities: No clubbing, cyanosis or edema Skin: Skin color, texture, turgor normal, no rashes or lesions Lymph nodes: Cervical, supraclavicular,nodes normal Neurologic: CNII-XII intact, normal strength, sensation and gait; reflexes 2+ and symmetric throughout   Psych: Normal mood, affect, hygiene and grooming  ASSESSMENT/PLAN: Routine general medical examination at a health care facility - Plan: CBC with Differential, Comprehensive metabolic panel, Lipid panel, POCT Urinalysis DIP (Proadvantage Device)  Need for pneumococcal vaccination - Plan: Pneumococcal conjugate vaccine 13-valent  Arthritis  Hyperlipidemia, unspecified hyperlipidemia type - Plan: Lipid panel  Gastroesophageal reflux disease without esophagitis  Screening for AAA (abdominal aortic aneurysm) - Plan: US AORTA  Screening for prostate cancer - Plan: PSA  Allergic rhinitis due to pollen, unspecified seasonality   Discussed PSA screening (risks/benefits), recommended at least 30 minutes of aerobic activity at least 5 days/week;  healthy diet and alcohol recommendations (less than or equal to 2 drinks/day) reviewed;  Immunization recommendations discussed.  Colonoscopy recommendations reviewed.   Medicare Attestation I have personally reviewed: The patient's medical and social history Their use of alcohol, tobacco or illicit drugs Their current medications and supplements The patient's functional ability including ADLs,fall risks, home safety risks, cognitive, and hearing and visual impairment Diet and physical activities Evidence for depression or mood disorders  The patient's weight, height, and BMI have been recorded in the chart.  I  have made referrals, counseling, and provided education to the patient based on review of the above and I have provided the patient with a written personalized care plan for preventive services.     Jill Alexanders, MD   04/10/2019

## 2019-04-10 NOTE — Patient Instructions (Signed)
  Andre Martinez , Thank you for taking time to come for your Medicare Wellness Visit. I appreciate your ongoing commitment to your health goals. Please review the following plan we discussed and let me know if I can assist you in the future.   These are the goals we discussed: Bring a copy of your living will and medical power of attorney This is a list of the screening recommended for you and due dates:  Health Maintenance  Topic Date Due  . Pneumonia vaccines (1 of 2 - PCV13) 02/24/2019  . HIV Screening  04/04/2023*  . Colon Cancer Screening  02/24/2021  . Tetanus Vaccine  10/17/2021  . Flu Shot  Completed  .  Hepatitis C: One time screening is recommended by Center for Disease Control  (CDC) for  adults born from [birth year] through 1967.   Completed  *Topic was postponed. The date shown is not the original due date.

## 2019-04-11 LAB — CBC WITH DIFFERENTIAL/PLATELET
Basophils Absolute: 0 10*3/uL (ref 0.0–0.2)
Basos: 1 %
EOS (ABSOLUTE): 0.1 10*3/uL (ref 0.0–0.4)
Eos: 1 %
Hematocrit: 47.4 % (ref 37.5–51.0)
Hemoglobin: 16.1 g/dL (ref 13.0–17.7)
Immature Grans (Abs): 0 10*3/uL (ref 0.0–0.1)
Immature Granulocytes: 0 %
Lymphocytes Absolute: 1.9 10*3/uL (ref 0.7–3.1)
Lymphs: 28 %
MCH: 30 pg (ref 26.6–33.0)
MCHC: 34 g/dL (ref 31.5–35.7)
MCV: 88 fL (ref 79–97)
Monocytes Absolute: 0.6 10*3/uL (ref 0.1–0.9)
Monocytes: 10 %
Neutrophils Absolute: 4 10*3/uL (ref 1.4–7.0)
Neutrophils: 60 %
Platelets: 221 10*3/uL (ref 150–450)
RBC: 5.36 x10E6/uL (ref 4.14–5.80)
RDW: 13 % (ref 11.6–15.4)
WBC: 6.6 10*3/uL (ref 3.4–10.8)

## 2019-04-11 LAB — LIPID PANEL
Chol/HDL Ratio: 3.2 ratio (ref 0.0–5.0)
Cholesterol, Total: 191 mg/dL (ref 100–199)
HDL: 60 mg/dL (ref >39)
LDL Chol Calc (NIH): 116 mg/dL — ABNORMAL HIGH (ref 0–99)
Triglycerides: 84 mg/dL (ref 0–149)
VLDL Cholesterol Cal: 15 mg/dL (ref 5–40)

## 2019-04-11 LAB — COMPREHENSIVE METABOLIC PANEL
ALT: 16 IU/L (ref 0–44)
AST: 23 IU/L (ref 0–40)
Albumin/Globulin Ratio: 2.1 (ref 1.2–2.2)
Albumin: 4.4 g/dL (ref 3.8–4.8)
Alkaline Phosphatase: 103 IU/L (ref 39–117)
BUN/Creatinine Ratio: 15 (ref 10–24)
BUN: 15 mg/dL (ref 8–27)
Bilirubin Total: 0.6 mg/dL (ref 0.0–1.2)
CO2: 22 mmol/L (ref 20–29)
Calcium: 9.8 mg/dL (ref 8.6–10.2)
Chloride: 103 mmol/L (ref 96–106)
Creatinine, Ser: 1.01 mg/dL (ref 0.76–1.27)
GFR calc Af Amer: 90 mL/min/{1.73_m2} (ref >59)
GFR calc non Af Amer: 78 mL/min/{1.73_m2} (ref >59)
Globulin, Total: 2.1 g/dL (ref 1.5–4.5)
Glucose: 90 mg/dL (ref 65–99)
Potassium: 4.8 mmol/L (ref 3.5–5.2)
Sodium: 141 mmol/L (ref 134–144)
Total Protein: 6.5 g/dL (ref 6.0–8.5)

## 2019-04-11 LAB — PSA: Prostate Specific Ag, Serum: 0.6 ng/mL (ref 0.0–4.0)

## 2019-04-19 ENCOUNTER — Telehealth: Payer: Self-pay | Admitting: Family Medicine

## 2019-04-19 NOTE — Telephone Encounter (Signed)
Pt called and said he has been feeling a light headed lately and his BP has been ranging from 165/88 and a higher. He wants to know if he should start back taking his Metoprolol. He now has new insurance and would need [facility] for [facility] day supply.

## 2019-04-19 NOTE — Telephone Encounter (Signed)
Explain that the lightheaded feeling is not coming from his blood pressure so if that continues, he will need an appointment.  Have him monitor the blood pressure for the next couple weeks to see if it stays up.  Then let us know.

## 2019-04-19 NOTE — Telephone Encounter (Signed)
Pt states he is not feeling well and his bp is stating high and is concerned. He called to find out if he needed to start taking his bp med again. He has been off metoprolol 50mg  for the last 11 months. Does he need to start taking that again

## 2019-04-19 NOTE — Telephone Encounter (Signed)
Pt was advised again that dizziness was not coming from bp and that it was a separate issue. Pt was advised you were not going to put him back on bp med right now. Wanted a couple weeks of bp readings first to determine next step. Pt was ok with this.

## 2019-04-26 ENCOUNTER — Ambulatory Visit
Admission: RE | Admit: 2019-04-26 | Discharge: 2019-04-26 | Disposition: A | Discharge status: Home / Self Care | Payer: Medicare Other | Source: Ambulatory Visit | Attending: Family Medicine | Admitting: Family Medicine

## 2019-04-26 DIAGNOSIS — Z136 Encounter for screening for cardiovascular disorders: Secondary | ICD-10-CM | POA: Diagnosis not present

## 2019-04-26 DIAGNOSIS — Z87891 Personal history of nicotine dependence: Secondary | ICD-10-CM | POA: Diagnosis not present

## 2019-05-21 ENCOUNTER — Encounter: Payer: Self-pay | Admitting: Family Medicine

## 2019-05-21 DIAGNOSIS — I10 Essential (primary) hypertension: Secondary | ICD-10-CM

## 2019-05-21 MED ORDER — METOPROLOL SUCCINATE ER 50 MG PO TB24: 50.0000 mg | ORAL_TABLET | Freq: Every day | ORAL | 3 refills | Status: DC

## 2019-06-13 ENCOUNTER — Telehealth: Payer: Self-pay | Admitting: Family Medicine

## 2019-06-13 NOTE — Telephone Encounter (Signed)
Pt called and states that he is new to mail order and placed his order late and he is now out of Toprol XL 50 mg. Pt requested a two week supply be sent into a local pharmacy.   Medication was called into [facility] [address] [facility] Barbara Cower [id number]  Tropol XL 50 mg #14 no refills. Okay per Beverlee Nims.

## 2019-08-15 DIAGNOSIS — H35033 Hypertensive retinopathy, bilateral: Secondary | ICD-10-CM | POA: Diagnosis not present

## 2019-08-15 DIAGNOSIS — H524 Presbyopia: Secondary | ICD-10-CM | POA: Diagnosis not present

## 2020-04-13 ENCOUNTER — Telehealth: Payer: Self-pay

## 2020-04-13 ENCOUNTER — Other Ambulatory Visit: Payer: Self-pay

## 2020-04-13 ENCOUNTER — Ambulatory Visit (INDEPENDENT_AMBULATORY_CARE_PROVIDER_SITE_OTHER): Payer: Medicare Other | Admitting: Family Medicine

## 2020-04-13 VITALS — BP 136/88 | HR 60 | Temp 96.6°F | Ht 73.0 in | Wt 201.8 lb

## 2020-04-13 DIAGNOSIS — K219 Gastro-esophageal reflux disease without esophagitis: Secondary | ICD-10-CM | POA: Diagnosis not present

## 2020-04-13 DIAGNOSIS — I499 Cardiac arrhythmia, unspecified: Secondary | ICD-10-CM | POA: Diagnosis not present

## 2020-04-13 DIAGNOSIS — J301 Allergic rhinitis due to pollen: Secondary | ICD-10-CM

## 2020-04-13 DIAGNOSIS — I10 Essential (primary) hypertension: Secondary | ICD-10-CM

## 2020-04-13 DIAGNOSIS — Z23 Encounter for immunization: Secondary | ICD-10-CM

## 2020-04-13 DIAGNOSIS — N529 Male erectile dysfunction, unspecified: Secondary | ICD-10-CM

## 2020-04-13 DIAGNOSIS — Z0000 Encounter for general adult medical examination without abnormal findings: Secondary | ICD-10-CM | POA: Diagnosis not present

## 2020-04-13 DIAGNOSIS — E785 Hyperlipidemia, unspecified: Secondary | ICD-10-CM

## 2020-04-13 DIAGNOSIS — M1990 Unspecified osteoarthritis, unspecified site: Secondary | ICD-10-CM | POA: Diagnosis not present

## 2020-04-13 DIAGNOSIS — Z96652 Presence of left artificial knee joint: Secondary | ICD-10-CM

## 2020-04-13 MED ORDER — METOPROLOL SUCCINATE ER 50 MG PO TB24: 50.0000 mg | ORAL_TABLET | Freq: Every day | ORAL | 3 refills | Status: DC

## 2020-04-13 MED ORDER — TADALAFIL 20 MG PO TABS: 20.0000 mg | ORAL_TABLET | Freq: Every day | ORAL | 0 refills | Status: DC | PRN

## 2020-04-13 NOTE — Progress Notes (Signed)
Andre Martinez is a 64 y.o. male who presents for annual wellness visit and follow-up on chronic medical conditions.  He has a history of reflux disease and is using Prilosec on an as-needed basis.  He continues on metoprolol for his arrhythmia and this is working well.  His arthritis is causing very little difficulty.  He has had a left TKR which has been quite successful.  Does have a history of hyperlipidemia but presently on no medications.  He has had difficulty recently with erectile dysfunction.  He does have underlying allergies and usually treats them with OTC medications.  Otherwise he is doing well.  He is now retired.  He and his wife have been married for 33 years.   Immunizations and Health Maintenance Immunization History  Administered Date(s) Administered   Influenza Split 02/19/2013   Influenza, Quadrivalent, Recombinant, Inj, Pf 02/19/2019   Influenza,inj,Quad PF,6+ Mos 01/16/2014   Influenza-Unspecified 03/15/2016, 03/28/2017, 02/08/2018, 02/19/2019   Pneumococcal Conjugate-13 04/10/2019   Tdap 04/02/2001, 10/18/2011   Zoster 04/21/2014   Zoster Recombinat (Shingrix) 03/06/2018, 05/07/2018   Health Maintenance Due  Topic Date Due   COVID-19 Vaccine (1) Never done   INFLUENZA VACCINE  12/15/2019   PNA vac Low Risk Adult (2 of 2 - PPSV23) 04/09/2020    Last colonoscopy: 02/25/11 Last PSA:04/10/19 Dentist: Q six months  Ophtho: Q year Exercise: walking and stretching four a hour Q day  Other doctors caring for patient include: N/A   Advanced Directives: Does Patient Have a Medical Advance Directive?: Yes Type of Advance Directive: Living will, Healthcare Power of Attorney Does patient want to make changes to medical advance directive?: No - Patient declined Copy of [place] in Chart?: No - copy requested  Depression screen:  See questionnaire below.     Depression screen [facility] 2/9 04/13/2020 04/10/2019 03/06/2018 12/02/2016 11/20/2015   Decreased Interest 0 0 0 0 0  Down, Depressed, Hopeless 0 0 0 0 0  PHQ - 2 Score 0 0 0 0 0    Fall Screen: See Questionaire below.   Fall Risk  04/13/2020 04/10/2019 03/06/2018 12/02/2016 11/20/2015  Falls in the past year? 0 0 No No No    ADL screen:  See questionnaire below.  Functional Status Survey: Is the patient deaf or have difficulty hearing?: No Does the patient have difficulty seeing, even when wearing glasses/contacts?: No Does the patient have difficulty concentrating, remembering, or making decisions?: No Does the patient have difficulty walking or climbing stairs?: No Does the patient have difficulty dressing or bathing?: No Does the patient have difficulty doing errands alone such as visiting a doctor's office or shopping?: No   Review of Systems  Constitutional: -, -unexpected weight change, -anorexia, -fatigue Allergy: -sneezing, -itching, -congestion Dermatology: denies changing moles, rash, lumps ENT: -runny nose, -ear pain, -sore throat,  Cardiology:  -chest pain, -palpitations, -orthopnea, Respiratory: -cough, -shortness of breath, -dyspnea on exertion, -wheezing,  Gastroenterology: -abdominal pain, -nausea, -vomiting, -diarrhea, -constipation, -dysphagia Hematology: -bleeding or bruising problems Musculoskeletal: -arthralgias, -myalgias, -joint swelling, -back pain, - Ophthalmology: -vision changes,  Urology: -dysuria, -difficulty urinating,  -urinary frequency, -urgency, incontinence Neurology: -, -numbness, , -memory loss, -falls, -dizziness    PHYSICAL EXAM:   General Appearance: Alert, cooperative, no distress, appears stated age Head: Normocephalic, without obvious abnormality, atraumatic Eyes: PERRL, conjunctiva/corneas clear, EOM's intact,  Ears: Normal TM's and external ear canals Nose: Nares normal, mucosa normal, no drainage or sinus   tenderness Throat: Lips, mucosa, and tongue normal;  teeth and gums normal Neck: Supple, no  lymphadenopathy, thyroid:no enlargement/tenderness/nodules; no carotid bruit or JVD Lungs: Clear to auscultation bilaterally without wheezes, rales or ronchi; respirations unlabored Heart: Regular rate and rhythm, S1 and S2 normal, no murmur, rub or gallop Abdomen: Soft, non-tender, nondistended, normoactive bowel sounds, no masses, no hepatosplenomegaly Skin: Skin color, texture, turgor normal, no rashes or lesions Lymph nodes: Cervical, supraclavicular,  normal Neurologic: CNII-XII intact, normal strength, sensation and gait; reflexes 2+ and symmetric throughout   Psych: Normal mood, affect, hygiene and grooming  ASSESSMENT/PLAN: Routine general medical examination at a health care facility - Plan: CBC with Differential/Platelet, Comprehensive metabolic panel, Lipid panel  Gastroesophageal reflux disease, unspecified whether esophagitis present  Cardiac arrhythmia, unspecified cardiac arrhythmia type  Arthritis  Status post total left knee replacement - 2020  Allergic rhinitis due to pollen, unspecified seasonality  Hyperlipidemia, unspecified hyperlipidemia type - Plan: Lipid panel  Erectile dysfunction, unspecified erectile dysfunction type - Plan: CBC with Differential/Platelet, Comprehensive metabolic panel, tadalafil (CIALIS) 20 MG tablet  Need for pneumococcal vaccination - Plan: Pneumococcal polysaccharide vaccine 23-valent greater than or equal to 2yo subcutaneous/IM  Essential hypertension - Plan: metoprolol succinate (TOPROL-XL) 50 MG 24 hr tablet Discussed the use of Cialis and possible side effects. Also discussed repeat management and explained that because of his age, he will most likely be placed on a statin drug. Discussed PSA screening (risks/benefits),  Immunization recommendations discussed.  Colonoscopy recommendations reviewed.   Medicare Attestation I have personally reviewed: The patient's medical and social history Their use of alcohol, tobacco or  illicit drugs Their current medications and supplements The patient's functional ability including ADLs,fall risks, home safety risks, cognitive, and hearing and visual impairment Diet and physical activities Evidence for depression or mood disorders  The patient's weight, height, and BMI have been recorded in the chart.  I have made referrals, counseling, and provided education to the patient based on review of the above and I have provided the patient with a written personalized care plan for preventive services.     Jill Alexanders, MD   04/13/2020

## 2020-04-13 NOTE — Patient Instructions (Signed)
  Mr. Andre Martinez , Thank you for taking time to come for your Medicare Wellness Visit. I appreciate your ongoing commitment to your health goals. Please review the following plan we discussed and let me know if I can assist you in the future.   These are the goals we discussed: Goals   None     This is a list of the screening recommended for you and due dates:  Health Maintenance  Topic Date Due  . Flu Shot  12/15/2019  . Pneumonia vaccines (2 of 2 - PPSV23) 04/09/2020  . Colon Cancer Screening  02/24/2021  . Tetanus Vaccine  10/17/2021  . COVID-19 Vaccine  Completed  .  Hepatitis C: One time screening is recommended by Center for Disease Control  (CDC) for  adults born from [birth year] through 1967.   Completed

## 2020-04-13 NOTE — Telephone Encounter (Signed)
Pt med [facility]. [place]

## 2020-04-13 NOTE — Telephone Encounter (Signed)
Pt. Called back stating that he was here this morning for a CPE and his metoprolol was sent to [facility] and was supposed to be sent to [facility] the other prescription cialis was sent to [facility] which is correct.

## 2020-04-14 LAB — CBC WITH DIFFERENTIAL/PLATELET
Basophils Absolute: 0.1 10*3/uL (ref 0.0–0.2)
Basos: 1 %
EOS (ABSOLUTE): 0.1 10*3/uL (ref 0.0–0.4)
Eos: 1 %
Hematocrit: 47.6 % (ref 37.5–51.0)
Hemoglobin: 15.7 g/dL (ref 13.0–17.7)
Immature Grans (Abs): 0 10*3/uL (ref 0.0–0.1)
Immature Granulocytes: 0 %
Lymphocytes Absolute: 2 10*3/uL (ref 0.7–3.1)
Lymphs: 30 %
MCH: 29.9 pg (ref 26.6–33.0)
MCHC: 33 g/dL (ref 31.5–35.7)
MCV: 91 fL (ref 79–97)
Monocytes Absolute: 0.6 10*3/uL (ref 0.1–0.9)
Monocytes: 9 %
Neutrophils Absolute: 3.9 10*3/uL (ref 1.4–7.0)
Neutrophils: 59 %
Platelets: 235 10*3/uL (ref 150–450)
RBC: 5.25 x10E6/uL (ref 4.14–5.80)
RDW: 12.8 % (ref 11.6–15.4)
WBC: 6.6 10*3/uL (ref 3.4–10.8)

## 2020-04-14 LAB — COMPREHENSIVE METABOLIC PANEL
ALT: 19 IU/L (ref 0–44)
AST: 20 IU/L (ref 0–40)
Albumin/Globulin Ratio: 2 (ref 1.2–2.2)
Albumin: 4.3 g/dL (ref 3.8–4.8)
Alkaline Phosphatase: 97 IU/L (ref 44–121)
BUN/Creatinine Ratio: 17 (ref 10–24)
BUN: 16 mg/dL (ref 8–27)
Bilirubin Total: 0.6 mg/dL (ref 0.0–1.2)
CO2: 23 mmol/L (ref 20–29)
Calcium: 9.5 mg/dL (ref 8.6–10.2)
Chloride: 104 mmol/L (ref 96–106)
Creatinine, Ser: 0.92 mg/dL (ref 0.76–1.27)
GFR calc Af Amer: 100 mL/min/{1.73_m2} (ref >59)
GFR calc non Af Amer: 86 mL/min/{1.73_m2} (ref >59)
Globulin, Total: 2.2 g/dL (ref 1.5–4.5)
Glucose: 91 mg/dL (ref 65–99)
Potassium: 4.5 mmol/L (ref 3.5–5.2)
Sodium: 141 mmol/L (ref 134–144)
Total Protein: 6.5 g/dL (ref 6.0–8.5)

## 2020-04-14 LAB — LIPID PANEL
Chol/HDL Ratio: 3.7 ratio (ref 0.0–5.0)
Cholesterol, Total: 198 mg/dL (ref 100–199)
HDL: 53 mg/dL (ref >39)
LDL Chol Calc (NIH): 128 mg/dL — ABNORMAL HIGH (ref 0–99)
Triglycerides: 97 mg/dL (ref 0–149)
VLDL Cholesterol Cal: 17 mg/dL (ref 5–40)

## 2020-05-12 ENCOUNTER — Telehealth (INDEPENDENT_AMBULATORY_CARE_PROVIDER_SITE_OTHER): Payer: Medicare Other | Admitting: Family Medicine

## 2020-05-12 ENCOUNTER — Other Ambulatory Visit: Payer: Self-pay

## 2020-05-12 ENCOUNTER — Other Ambulatory Visit (INDEPENDENT_AMBULATORY_CARE_PROVIDER_SITE_OTHER): Payer: Medicare Other

## 2020-05-12 ENCOUNTER — Encounter: Payer: Self-pay | Admitting: Family Medicine

## 2020-05-12 VITALS — HR 72 | Temp 97.5°F | Wt 200.0 lb

## 2020-05-12 DIAGNOSIS — R059 Cough, unspecified: Secondary | ICD-10-CM

## 2020-05-12 DIAGNOSIS — R519 Headache, unspecified: Secondary | ICD-10-CM | POA: Diagnosis not present

## 2020-05-12 LAB — POC COVID19 BINAXNOW: SARS Coronavirus 2 Ag: NEGATIVE

## 2020-05-12 NOTE — Progress Notes (Signed)
   Subjective:    Patient ID: Andre Martinez, male    DOB: Nov 24, 1955, 64 y.o.   MRN: [id number]  HPI I connected with  Andre Martinez on 05/12/20 by a video enabled telemedicine application and verified that I am speaking with the correct person using two identifiers.  Caregility used.  Me: Office patient: Home I discussed the limitations of evaluation and management by telemedicine. The patient expressed understanding and agreed to proceed. He states that last Tuesday he developed headache, nasal congestion, dry cough and slight sore throat.  The symptoms have continued through today.  No fever, chills, productive cough, shortness of breath.   Review of Systems     Objective:   Physical Exam Alert and in no distress.  His voice sounds normal with no evidence of tachypnea.      Assessment & Plan:  Cough - Plan: POC COVID-19, Novel Coronavirus, NAA ([facility])  Acute nonintractable headache, unspecified headache type - Plan: POC COVID-19, Novel Coronavirus, NAA ([facility]) Briefly discussed that if this is positive, even though we have no rules, he should still sequestered until his symptoms have disappeared. 20 minutes spent today reviewing his medical record, history, evaluation and management as well as documentation

## 2020-05-14 LAB — SARS-COV-2, NAA 2 DAY TAT

## 2020-05-14 LAB — NOVEL CORONAVIRUS, NAA: SARS-CoV-2, NAA: NOT DETECTED

## 2020-09-21 DIAGNOSIS — H5212 Myopia, left eye: Secondary | ICD-10-CM | POA: Diagnosis not present

## 2020-09-21 DIAGNOSIS — I10 Essential (primary) hypertension: Secondary | ICD-10-CM | POA: Diagnosis not present

## 2020-09-21 DIAGNOSIS — H2513 Age-related nuclear cataract, bilateral: Secondary | ICD-10-CM | POA: Diagnosis not present

## 2020-10-29 DIAGNOSIS — Z96652 Presence of left artificial knee joint: Secondary | ICD-10-CM | POA: Diagnosis not present

## 2020-10-29 DIAGNOSIS — M25561 Pain in right knee: Secondary | ICD-10-CM | POA: Diagnosis not present

## 2020-12-30 LAB — COLOGUARD: Cologuard: NEGATIVE

## 2021-01-05 LAB — IFOBT (OCCULT BLOOD): IFOBT: NEGATIVE

## 2021-01-28 DIAGNOSIS — Z96652 Presence of left artificial knee joint: Secondary | ICD-10-CM | POA: Diagnosis not present

## 2021-01-28 DIAGNOSIS — M1711 Unilateral primary osteoarthritis, right knee: Secondary | ICD-10-CM | POA: Diagnosis not present

## 2021-03-26 DIAGNOSIS — M170 Bilateral primary osteoarthritis of knee: Secondary | ICD-10-CM | POA: Diagnosis not present

## 2021-03-26 DIAGNOSIS — M1711 Unilateral primary osteoarthritis, right knee: Secondary | ICD-10-CM | POA: Diagnosis not present

## 2021-04-15 ENCOUNTER — Encounter: Payer: Self-pay | Admitting: Family Medicine

## 2021-04-15 ENCOUNTER — Other Ambulatory Visit: Payer: Self-pay

## 2021-04-15 ENCOUNTER — Ambulatory Visit (INDEPENDENT_AMBULATORY_CARE_PROVIDER_SITE_OTHER): Payer: Medicare Other | Admitting: Family Medicine

## 2021-04-15 VITALS — BP 140/86 | HR 70 | Temp 96.0°F | Ht 73.0 in | Wt 198.0 lb

## 2021-04-15 DIAGNOSIS — Z0000 Encounter for general adult medical examination without abnormal findings: Secondary | ICD-10-CM

## 2021-04-15 DIAGNOSIS — J301 Allergic rhinitis due to pollen: Secondary | ICD-10-CM

## 2021-04-15 DIAGNOSIS — I10 Essential (primary) hypertension: Secondary | ICD-10-CM | POA: Diagnosis not present

## 2021-04-15 DIAGNOSIS — Z1211 Encounter for screening for malignant neoplasm of colon: Secondary | ICD-10-CM

## 2021-04-15 DIAGNOSIS — K219 Gastro-esophageal reflux disease without esophagitis: Secondary | ICD-10-CM

## 2021-04-15 DIAGNOSIS — M1990 Unspecified osteoarthritis, unspecified site: Secondary | ICD-10-CM

## 2021-04-15 DIAGNOSIS — Z96652 Presence of left artificial knee joint: Secondary | ICD-10-CM

## 2021-04-15 DIAGNOSIS — N529 Male erectile dysfunction, unspecified: Secondary | ICD-10-CM

## 2021-04-15 DIAGNOSIS — E785 Hyperlipidemia, unspecified: Secondary | ICD-10-CM

## 2021-04-15 LAB — COMPREHENSIVE METABOLIC PANEL
ALT: 22 IU/L (ref 0–44)
AST: 22 IU/L (ref 0–40)
Albumin/Globulin Ratio: 2.4 — ABNORMAL HIGH (ref 1.2–2.2)
Albumin: 4.7 g/dL (ref 3.8–4.8)
Alkaline Phosphatase: 107 IU/L (ref 44–121)
BUN/Creatinine Ratio: 18 (ref 10–24)
BUN: 17 mg/dL (ref 8–27)
Bilirubin Total: 0.5 mg/dL (ref 0.0–1.2)
CO2: 23 mmol/L (ref 20–29)
Calcium: 9.8 mg/dL (ref 8.6–10.2)
Chloride: 102 mmol/L (ref 96–106)
Creatinine, Ser: 0.96 mg/dL (ref 0.76–1.27)
Globulin, Total: 2 g/dL (ref 1.5–4.5)
Glucose: 94 mg/dL (ref 70–99)
Potassium: 4.8 mmol/L (ref 3.5–5.2)
Sodium: 139 mmol/L (ref 134–144)
Total Protein: 6.7 g/dL (ref 6.0–8.5)
eGFR: 87 mL/min/{1.73_m2} (ref >59)

## 2021-04-15 LAB — POCT URINALYSIS DIP (PROADVANTAGE DEVICE)
Bilirubin, UA: NEGATIVE
Glucose, UA: NEGATIVE mg/dL
Ketones, POC UA: NEGATIVE mg/dL
Leukocytes, UA: NEGATIVE
Nitrite, UA: NEGATIVE
Protein Ur, POC: NEGATIVE mg/dL
Specific Gravity, Urine: 1.025
Urobilinogen, Ur: 0.2
pH, UA: 6 (ref 5.0–8.0)

## 2021-04-15 LAB — CBC WITH DIFFERENTIAL/PLATELET
Basophils Absolute: 0 10*3/uL (ref 0.0–0.2)
Basos: 1 %
EOS (ABSOLUTE): 0.1 10*3/uL (ref 0.0–0.4)
Eos: 1 %
Hematocrit: 47 % (ref 37.5–51.0)
Hemoglobin: 15.9 g/dL (ref 13.0–17.7)
Immature Grans (Abs): 0 10*3/uL (ref 0.0–0.1)
Immature Granulocytes: 0 %
Lymphocytes Absolute: 2.2 10*3/uL (ref 0.7–3.1)
Lymphs: 27 %
MCH: 29.7 pg (ref 26.6–33.0)
MCHC: 33.8 g/dL (ref 31.5–35.7)
MCV: 88 fL (ref 79–97)
Monocytes Absolute: 0.6 10*3/uL (ref 0.1–0.9)
Monocytes: 8 %
Neutrophils Absolute: 5.1 10*3/uL (ref 1.4–7.0)
Neutrophils: 63 %
Platelets: 258 10*3/uL (ref 150–450)
RBC: 5.35 x10E6/uL (ref 4.14–5.80)
RDW: 12.4 % (ref 11.6–15.4)
WBC: 8 10*3/uL (ref 3.4–10.8)

## 2021-04-15 LAB — LIPID PANEL
Chol/HDL Ratio: 3.9 ratio (ref 0.0–5.0)
Cholesterol, Total: 207 mg/dL — ABNORMAL HIGH (ref 100–199)
HDL: 53 mg/dL (ref >39)
LDL Chol Calc (NIH): 134 mg/dL — ABNORMAL HIGH (ref 0–99)
Triglycerides: 113 mg/dL (ref 0–149)
VLDL Cholesterol Cal: 20 mg/dL (ref 5–40)

## 2021-04-15 MED ORDER — ATORVASTATIN CALCIUM 20 MG PO TABS: 20.0000 mg | ORAL_TABLET | Freq: Every day | ORAL | 3 refills | Status: DC

## 2021-04-15 MED ORDER — METOPROLOL SUCCINATE ER 50 MG PO TB24: 50.0000 mg | ORAL_TABLET | Freq: Every day | ORAL | 3 refills | Status: DC

## 2021-04-15 NOTE — Progress Notes (Signed)
Andre Martinez is a 65 y.o. male who presents for annual wellness visit CPE and follow-up on chronic medical conditions.  He has no concerns or complaints.  He is retired and getting ready to start traveling.  He does have reflux disease and is on Prilosec regularly he does have a follow-up visit with gastroenterology scheduled.  He also has right knee arthritis and will eventually get a TKR on that.  He continues on metoprolol for his blood pressure.  He does not need a refill on Cialis at the present time.  Does have remote history of smoking but did have an ultrasound done.     Immunizations and Health Maintenance Immunization History  Administered Date(s) Administered   Influenza Split 02/19/2013   Influenza, Quadrivalent, Recombinant, Inj, Pf 02/19/2019   Influenza,inj,Quad PF,6+ Mos 01/16/2014   Influenza-Unspecified 03/15/2016, 03/28/2017, 02/08/2018, 02/19/2019   PFIZER(Purple Top)SARS-COV-2 Vaccination 06/07/2019, 06/28/2019   Pneumococcal Conjugate-13 04/10/2019   Pneumococcal Polysaccharide-23 04/13/2020   Tdap 04/02/2001, 10/18/2011   Zoster Recombinat (Shingrix) 03/06/2018, 05/07/2018   Zoster, Live 04/21/2014   Health Maintenance Due  Topic Date Due   COVID-19 Vaccine (3 - Booster for Pfizer series) 08/23/2019   INFLUENZA VACCINE  12/14/2020   COLONOSCOPY (Pts 45-65yrs Insurance coverage will need to be confirmed)  02/24/2021    Last colonoscopy:02/25/2011  Dr. Amedeo Plenty Last PSA:  04/10/2019 (0.6) Dentist:Q six months  Ophtho: Q year Exercise: stationary bike seven days a week  Other doctors caring for patient include: Dr. Amedeo Plenty  GI                      Dr. Wynelle Link orthopedic            Dr. Ronnald Ramp dermatology               Advanced Directives: Does Patient Have a Medical Advance Directive?: No Would patient like information on creating a medical advance directive?: Yes (ED - Information included in AVS)  Depression screen:  See questionnaire below.     Depression  screen [facility] 2/9 04/15/2021 04/13/2020 04/10/2019 03/06/2018 12/02/2016  Decreased Interest 0 0 0 0 0  Down, Depressed, Hopeless 0 0 0 0 0  PHQ - 2 Score 0 0 0 0 0    Fall Screen: See Questionaire below.   Fall Risk  04/15/2021 04/13/2020 04/10/2019 03/06/2018 12/02/2016  Falls in the past year? 0 0 0 No No  Number falls in past yr: 0 - - - -  Injury with Fall? 0 - - - -  Risk for fall due to : No Fall Risks - - - -  Follow up Falls evaluation completed - - - -    ADL screen:  See questionnaire below.  Functional Status Survey: Is the patient deaf or have difficulty hearing?: No Does the patient have difficulty seeing, even when wearing glasses/contacts?: No Does the patient have difficulty concentrating, remembering, or making decisions?: No Does the patient have difficulty walking or climbing stairs?: Yes (knee issues) Does the patient have difficulty dressing or bathing?: No Does the patient have difficulty doing errands alone such as visiting a doctor's office or shopping?: No   Review of Systems  Constitutional: -, -unexpected weight change, -anorexia, -fatigue Allergy: -sneezing, -itching, -congestion Dermatology: denies changing moles, rash, lumps ENT: -runny nose, -ear pain, -sore throat,  Cardiology:  -chest pain, -palpitations, -orthopnea, Respiratory: -cough, -shortness of breath, -dyspnea on exertion, -wheezing,  Gastroenterology: -abdominal pain, -nausea, -vomiting, -diarrhea, -constipation, -dysphagia Hematology: -bleeding or  bruising problems Musculoskeletal: -arthralgias, -myalgias, -joint swelling, -back pain, - Ophthalmology: -vision changes,  Urology: -dysuria, -difficulty urinating,  -urinary frequency, -urgency, incontinence Neurology: -, -numbness, , -memory loss, -falls, -dizziness    PHYSICAL EXAM:  General Appearance: Alert, cooperative, no distress, appears stated age Head: Normocephalic, without obvious abnormality, atraumatic Eyes: PERRL,  conjunctiva/corneas clear, EOM's intact,  Ears: Normal TM's and external ear canals Nose: Nares normal, mucosa normal, no drainage or sinus   tenderness Throat: Lips, mucosa, and tongue normal; teeth and gums normal Neck: Supple, no lymphadenopathy, thyroid:no enlargement/tenderness/nodules; no carotid bruit or JVD Lungs: Clear to auscultation bilaterally without wheezes, rales or ronchi; respirations unlabored Heart: Regular rate and rhythm, S1 and S2 normal, no murmur, rub or gallop Abdomen: Soft, non-tender, nondistended, normoactive bowel sounds, no masses, no hepatosplenomegaly Extremities: No clubbing, cyanosis or edema Pulses: 2+ and symmetric all extremities Skin: Skin color, texture, turgor normal, no rashes or lesions Lymph nodes: Cervical, supraclavicular, and axillary nodes normal Neurologic: CNII-XII intact, normal strength, sensation and gait; reflexes 2+ and symmetric throughout   Psych: Normal mood, affect, hygiene and grooming Urine microscopic was negative for red cells although the dipstick was 2+. ASSESSMENT/PLAN: Routine general medical examination at a health care facility - Plan: CBC with Differential/Platelet, Comprehensive metabolic panel, Lipid panel, POCT Urinalysis DIP (Proadvantage Device)  Gastroesophageal reflux disease, unspecified whether esophagitis present  Status post total left knee replacement  Hyperlipidemia, unspecified hyperlipidemia type - Plan: Lipid panel  Arthritis  Allergic rhinitis due to pollen, unspecified seasonality  Erectile dysfunction, unspecified erectile dysfunction type  Essential hypertension - Plan: CBC with Differential/Platelet, Comprehensive metabolic panel, metoprolol succinate (TOPROL-XL) 50 MG 24 hr tablet  Screening for colon cancer - Plan: Cologuard He plans to have a right TKR within the next several months.  Does not need a refill on his Cialis.  Continue to treat his allergies as needed.  He will follow-up with GI  concerning the reflux and need for endoscopy. Discussed placing him on a statin drug due to his age.  I will call in Lipitor for him. Immunization recommendations discussed.  Colonoscopy recommendations reviewed.   Medicare Attestation I have personally reviewed: The patient's medical and social history Their use of alcohol, tobacco or illicit drugs Their current medications and supplements The patient's functional ability including ADLs,fall risks, home safety risks, cognitive, and hearing and visual impairment Diet and physical activities Evidence for depression or mood disorders  The patient's weight, height, and BMI have been recorded in the chart.  I have made referrals, counseling, and provided education to the patient based on review of the above and I have provided the patient with a written personalized care plan for preventive services.     Jill Alexanders, MD   04/15/2021

## 2021-04-15 NOTE — Patient Instructions (Signed)
  Mr. Siefert , Thank you for taking time to come for your Medicare Wellness Visit. I appreciate your ongoing commitment to your health goals. Please review the following plan we discussed and let me know if I can assist you in the future.   These are the goals we discussed:  Goals   None     This is a list of the screening recommended for you and due dates:  Health Maintenance  Topic Date Due   COVID-19 Vaccine (5 - Booster for Pfizer series) 01/12/2021   Colon Cancer Screening  05/20/2021*   Tetanus Vaccine  10/17/2021   Pneumonia Vaccine  Completed   Flu Shot  Completed   Hepatitis C Screening: USPSTF Recommendation to screen - Ages 18-79 yo.  Completed   Zoster (Shingles) Vaccine  Completed   HPV Vaccine  Aged Out  *Topic was postponed. The date shown is not the original due date.

## 2021-04-24 DIAGNOSIS — Z1211 Encounter for screening for malignant neoplasm of colon: Secondary | ICD-10-CM | POA: Diagnosis not present

## 2021-04-26 DIAGNOSIS — M1711 Unilateral primary osteoarthritis, right knee: Secondary | ICD-10-CM | POA: Diagnosis not present

## 2021-04-29 ENCOUNTER — Other Ambulatory Visit: Payer: Self-pay | Admitting: Gastroenterology

## 2021-04-29 DIAGNOSIS — R1310 Dysphagia, unspecified: Secondary | ICD-10-CM | POA: Diagnosis not present

## 2021-04-29 LAB — COLOGUARD: COLOGUARD: NEGATIVE

## 2021-05-04 ENCOUNTER — Other Ambulatory Visit: Payer: Medicare Other

## 2021-05-05 ENCOUNTER — Ambulatory Visit
Admission: RE | Admit: 2021-05-05 | Discharge: 2021-05-05 | Disposition: A | Discharge status: Home / Self Care | Payer: Medicare Other | Source: Ambulatory Visit | Attending: Gastroenterology | Admitting: Gastroenterology

## 2021-05-05 DIAGNOSIS — R1310 Dysphagia, unspecified: Secondary | ICD-10-CM

## 2021-05-05 DIAGNOSIS — K224 Dyskinesia of esophagus: Secondary | ICD-10-CM | POA: Diagnosis not present

## 2021-05-24 ENCOUNTER — Encounter: Payer: Self-pay | Admitting: Family Medicine

## 2021-05-24 ENCOUNTER — Other Ambulatory Visit: Payer: Self-pay

## 2021-05-24 ENCOUNTER — Telehealth (INDEPENDENT_AMBULATORY_CARE_PROVIDER_SITE_OTHER): Payer: Medicare Other | Admitting: Family Medicine

## 2021-05-24 VITALS — Temp 97.5°F | Wt 198.0 lb

## 2021-05-24 DIAGNOSIS — U071 COVID-19: Secondary | ICD-10-CM

## 2021-05-24 NOTE — Progress Notes (Signed)
° °  Subjective:    Patient ID: Andre Martinez, male    DOB: 17-Jan-1956, 66 y.o.   MRN: [id number]  HPI Documentation for virtual audio and video telecommunications through [place] encounter: The patient was located at home. 2 patient identifiers used.  The provider was located in the office. The patient did consent to this visit and is aware of possible charges through their insurance for this visit. The other persons participating in this telemedicine service were none. Time spent on call was 5 minutes and in review of previous records >20 minutes total for counseling and coordination of care. This virtual service is not related to other E/M service within previous 7 days.  He states that yesterday evening he developed some slight malaise which was unusual and tested positive for COVID.  He has had 4 vaccines.  Today he is complaining of a slight headache, sore throat, fatigue and some slight chest tightness.  He has been taking Tylenol for relief of the symptoms.  Review of Systems     Objective:   Physical Exam Alert and in no distress with normal breathing pattern.       Assessment & Plan:  COVID-19 I discussed the fact that we have a 5-day window which goes through Friday to determine whether he needs any antiviral.  Discussed worsening of cough, shortness of breath and fever is the main issues we would deal with.  Explained that over the next day or 2 he might feel that he gets slightly worse.  He is to continue on his Tylenol and treat symptomatically.  Then discussed that after 5 days he should wear a mask for another 5 days and should do in the home free.  He was comfortable with this.

## 2021-05-31 ENCOUNTER — Encounter: Payer: Self-pay | Admitting: Family Medicine

## 2021-05-31 LAB — HM COLONOSCOPY

## 2021-05-31 NOTE — Telephone Encounter (Signed)
Pt was advised to call [facility] to see if they got Prior auth done for the procedure scheduled on Wed. 06/02/21. Pt will call back to let us know . [place]

## 2021-06-02 DIAGNOSIS — Z1211 Encounter for screening for malignant neoplasm of colon: Secondary | ICD-10-CM | POA: Diagnosis not present

## 2021-06-02 DIAGNOSIS — K317 Polyp of stomach and duodenum: Secondary | ICD-10-CM | POA: Diagnosis not present

## 2021-06-02 DIAGNOSIS — R1310 Dysphagia, unspecified: Secondary | ICD-10-CM | POA: Diagnosis not present

## 2021-06-04 DIAGNOSIS — K317 Polyp of stomach and duodenum: Secondary | ICD-10-CM | POA: Diagnosis not present

## 2021-07-08 DIAGNOSIS — L821 Other seborrheic keratosis: Secondary | ICD-10-CM | POA: Diagnosis not present

## 2021-07-08 DIAGNOSIS — D225 Melanocytic nevi of trunk: Secondary | ICD-10-CM | POA: Diagnosis not present

## 2021-07-08 DIAGNOSIS — D2272 Melanocytic nevi of left lower limb, including hip: Secondary | ICD-10-CM | POA: Diagnosis not present

## 2021-07-08 DIAGNOSIS — D485 Neoplasm of uncertain behavior of skin: Secondary | ICD-10-CM | POA: Diagnosis not present

## 2021-07-08 DIAGNOSIS — R21 Rash and other nonspecific skin eruption: Secondary | ICD-10-CM | POA: Diagnosis not present

## 2021-07-08 DIAGNOSIS — D2261 Melanocytic nevi of right upper limb, including shoulder: Secondary | ICD-10-CM | POA: Diagnosis not present

## 2021-07-08 DIAGNOSIS — Z85828 Personal history of other malignant neoplasm of skin: Secondary | ICD-10-CM | POA: Diagnosis not present

## 2021-07-08 DIAGNOSIS — D2262 Melanocytic nevi of left upper limb, including shoulder: Secondary | ICD-10-CM | POA: Diagnosis not present

## 2021-07-08 DIAGNOSIS — D2271 Melanocytic nevi of right lower limb, including hip: Secondary | ICD-10-CM | POA: Diagnosis not present

## 2021-07-08 HISTORY — PX: SKIN BIOPSY: SHX1

## 2021-07-27 DIAGNOSIS — M1711 Unilateral primary osteoarthritis, right knee: Secondary | ICD-10-CM | POA: Diagnosis not present

## 2021-08-03 DIAGNOSIS — M1711 Unilateral primary osteoarthritis, right knee: Secondary | ICD-10-CM | POA: Diagnosis not present

## 2021-08-06 ENCOUNTER — Encounter: Payer: Self-pay | Admitting: Family Medicine

## 2021-08-12 DIAGNOSIS — M1711 Unilateral primary osteoarthritis, right knee: Secondary | ICD-10-CM | POA: Diagnosis not present

## 2021-09-06 DIAGNOSIS — M1711 Unilateral primary osteoarthritis, right knee: Secondary | ICD-10-CM | POA: Diagnosis not present

## 2021-09-30 ENCOUNTER — Other Ambulatory Visit: Payer: Self-pay

## 2021-09-30 ENCOUNTER — Other Ambulatory Visit: Payer: Self-pay | Admitting: Student

## 2021-09-30 ENCOUNTER — Encounter (HOSPITAL_COMMUNITY): Payer: Self-pay | Admitting: Emergency Medicine

## 2021-09-30 ENCOUNTER — Observation Stay (HOSPITAL_COMMUNITY)
Admission: EM | Admit: 2021-09-30 | Discharge: 2021-10-01 | Disposition: A | Discharge status: Home / Self Care | Payer: Medicare Other | Attending: Interventional Cardiology | Admitting: Interventional Cardiology

## 2021-09-30 ENCOUNTER — Encounter (HOSPITAL_COMMUNITY)
Admission: EM | Disposition: A | Discharge status: Home / Self Care | Payer: Self-pay | Source: Home / Self Care | Attending: Emergency Medicine

## 2021-09-30 ENCOUNTER — Emergency Department (HOSPITAL_COMMUNITY): Payer: Medicare Other

## 2021-09-30 DIAGNOSIS — I249 Acute ischemic heart disease, unspecified: Secondary | ICD-10-CM | POA: Diagnosis not present

## 2021-09-30 DIAGNOSIS — I2510 Atherosclerotic heart disease of native coronary artery without angina pectoris: Secondary | ICD-10-CM | POA: Insufficient documentation

## 2021-09-30 DIAGNOSIS — Z96652 Presence of left artificial knee joint: Secondary | ICD-10-CM | POA: Diagnosis not present

## 2021-09-30 DIAGNOSIS — R404 Transient alteration of awareness: Secondary | ICD-10-CM | POA: Diagnosis not present

## 2021-09-30 DIAGNOSIS — Z87891 Personal history of nicotine dependence: Secondary | ICD-10-CM | POA: Diagnosis not present

## 2021-09-30 DIAGNOSIS — I214 Non-ST elevation (NSTEMI) myocardial infarction: Secondary | ICD-10-CM | POA: Diagnosis not present

## 2021-09-30 DIAGNOSIS — Z743 Need for continuous supervision: Secondary | ICD-10-CM | POA: Diagnosis not present

## 2021-09-30 DIAGNOSIS — Z955 Presence of coronary angioplasty implant and graft: Secondary | ICD-10-CM

## 2021-09-30 DIAGNOSIS — R9431 Abnormal electrocardiogram [ECG] [EKG]: Secondary | ICD-10-CM | POA: Diagnosis not present

## 2021-09-30 DIAGNOSIS — R778 Other specified abnormalities of plasma proteins: Secondary | ICD-10-CM | POA: Diagnosis not present

## 2021-09-30 DIAGNOSIS — R7989 Other specified abnormal findings of blood chemistry: Secondary | ICD-10-CM

## 2021-09-30 DIAGNOSIS — E785 Hyperlipidemia, unspecified: Secondary | ICD-10-CM | POA: Diagnosis not present

## 2021-09-30 DIAGNOSIS — I10 Essential (primary) hypertension: Secondary | ICD-10-CM

## 2021-09-30 DIAGNOSIS — R079 Chest pain, unspecified: Secondary | ICD-10-CM

## 2021-09-30 DIAGNOSIS — I499 Cardiac arrhythmia, unspecified: Secondary | ICD-10-CM | POA: Diagnosis not present

## 2021-09-30 DIAGNOSIS — R0789 Other chest pain: Secondary | ICD-10-CM | POA: Diagnosis not present

## 2021-09-30 DIAGNOSIS — Z79899 Other long term (current) drug therapy: Secondary | ICD-10-CM | POA: Diagnosis not present

## 2021-09-30 HISTORY — PX: CORONARY STENT INTERVENTION: CATH118234

## 2021-09-30 HISTORY — PX: LEFT HEART CATH AND CORONARY ANGIOGRAPHY: CATH118249

## 2021-09-30 LAB — TROPONIN I (HIGH SENSITIVITY)
Troponin I (High Sensitivity): 39 ng/L — ABNORMAL HIGH (ref <18)
Troponin I (High Sensitivity): 1432 ng/L (ref <18)

## 2021-09-30 LAB — CBC WITH DIFFERENTIAL/PLATELET
Abs Immature Granulocytes: 0.03 10*3/uL (ref 0.00–0.07)
Basophils Absolute: 0.1 10*3/uL (ref 0.0–0.1)
Basophils Relative: 1 %
Eosinophils Absolute: 0.1 10*3/uL (ref 0.0–0.5)
Eosinophils Relative: 1 %
HCT: 40.7 % (ref 39.0–52.0)
Hemoglobin: 13.8 g/dL (ref 13.0–17.0)
Immature Granulocytes: 0 %
Lymphocytes Relative: 31 %
Lymphs Abs: 2.8 10*3/uL (ref 0.7–4.0)
MCH: 30.7 pg (ref 26.0–34.0)
MCHC: 33.9 g/dL (ref 30.0–36.0)
MCV: 90.4 fL (ref 80.0–100.0)
Monocytes Absolute: 0.7 10*3/uL (ref 0.1–1.0)
Monocytes Relative: 8 %
Neutro Abs: 5.1 10*3/uL (ref 1.7–7.7)
Neutrophils Relative %: 59 %
Platelets: 279 10*3/uL (ref 150–400)
RBC: 4.5 MIL/uL (ref 4.22–5.81)
RDW: 12.8 % (ref 11.5–15.5)
WBC: 8.8 10*3/uL (ref 4.0–10.5)
nRBC: 0 % (ref 0.0–0.2)

## 2021-09-30 LAB — COMPREHENSIVE METABOLIC PANEL
ALT: 21 U/L (ref 0–44)
AST: 21 U/L (ref 15–41)
Albumin: 3.2 g/dL — ABNORMAL LOW (ref 3.5–5.0)
Alkaline Phosphatase: 79 U/L (ref 38–126)
Anion gap: 8 (ref 5–15)
BUN: 15 mg/dL (ref 8–23)
CO2: 24 mmol/L (ref 22–32)
Calcium: 8.7 mg/dL — ABNORMAL LOW (ref 8.9–10.3)
Chloride: 107 mmol/L (ref 98–111)
Creatinine, Ser: 1.03 mg/dL (ref 0.61–1.24)
GFR, Estimated: >60 mL/min (ref >60)
Glucose, Bld: 129 mg/dL — ABNORMAL HIGH (ref 70–99)
Potassium: 3.8 mmol/L (ref 3.5–5.1)
Sodium: 139 mmol/L (ref 135–145)
Total Bilirubin: 0.8 mg/dL (ref 0.3–1.2)
Total Protein: 5.7 g/dL — ABNORMAL LOW (ref 6.5–8.1)

## 2021-09-30 LAB — D-DIMER, QUANTITATIVE: D-Dimer, Quant: <0.27 ug/mL-FEU (ref 0.00–0.50)

## 2021-09-30 LAB — LIPASE, BLOOD: Lipase: 29 U/L (ref 11–51)

## 2021-09-30 SURGERY — LEFT HEART CATH AND CORONARY ANGIOGRAPHY
Anesthesia: LOCAL

## 2021-09-30 MED ORDER — NITROGLYCERIN 1 MG/10 ML FOR IR/CATH LAB
INTRA_ARTERIAL | Status: AC
  Filled 2021-09-30: qty 10

## 2021-09-30 MED ORDER — MIDAZOLAM HCL 2 MG/2ML IJ SOLN
INTRAMUSCULAR | Status: AC
  Filled 2021-09-30: qty 2

## 2021-09-30 MED ORDER — HEPARIN SODIUM (PORCINE) 1000 UNIT/ML IJ SOLN
INTRAMUSCULAR | Status: AC
  Filled 2021-09-30: qty 10

## 2021-09-30 MED ORDER — NITROGLYCERIN 0.4 MG SL SUBL: 0.4000 mg | SUBLINGUAL_TABLET | SUBLINGUAL | Status: DC | PRN

## 2021-09-30 MED ORDER — SODIUM CHLORIDE 0.9 % IV SOLN
250.0000 mL | INTRAVENOUS | Status: DC | PRN
Start: 2021-09-30 — End: 2021-10-01

## 2021-09-30 MED ORDER — SODIUM CHLORIDE 0.9 % IV SOLN: INTRAVENOUS | Status: AC

## 2021-09-30 MED ORDER — VERAPAMIL HCL 2.5 MG/ML IV SOLN
INTRAVENOUS | Status: DC | PRN
  Administered 2021-09-30: 10 mL via INTRA_ARTERIAL

## 2021-09-30 MED ORDER — HEPARIN (PORCINE) IN NACL 1000-0.9 UT/500ML-% IV SOLN
INTRAVENOUS | Status: AC
  Filled 2021-09-30: qty 1000

## 2021-09-30 MED ORDER — ONDANSETRON HCL 4 MG/2ML IJ SOLN: 4.0000 mg | Freq: Four times a day (QID) | INTRAMUSCULAR | Status: DC | PRN

## 2021-09-30 MED ORDER — LABETALOL HCL 5 MG/ML IV SOLN: 10.0000 mg | INTRAVENOUS | Status: AC | PRN

## 2021-09-30 MED ORDER — HYDRALAZINE HCL 20 MG/ML IJ SOLN: 10.0000 mg | INTRAMUSCULAR | Status: AC | PRN

## 2021-09-30 MED ORDER — METOPROLOL SUCCINATE ER 50 MG PO TB24
50.0000 mg | ORAL_TABLET | Freq: Every day | ORAL | Status: DC
  Administered 2021-09-30: 50 mg via ORAL
  Filled 2021-09-30 (×3): qty 1

## 2021-09-30 MED ORDER — ASPIRIN 81 MG PO CHEW
81.0000 mg | CHEWABLE_TABLET | Freq: Every day | ORAL | Status: DC
  Administered 2021-09-30 – 2021-10-01 (×2): 81 mg via ORAL
  Filled 2021-09-30 (×2): qty 1

## 2021-09-30 MED ORDER — ASPIRIN 81 MG PO TBEC: 81.0000 mg | DELAYED_RELEASE_TABLET | Freq: Every day | ORAL | Status: DC

## 2021-09-30 MED ORDER — TICAGRELOR 90 MG PO TABS
ORAL_TABLET | ORAL | Status: AC
  Filled 2021-09-30: qty 2

## 2021-09-30 MED ORDER — SODIUM CHLORIDE 0.9% FLUSH: 3.0000 mL | INTRAVENOUS | Status: DC | PRN

## 2021-09-30 MED ORDER — FENTANYL CITRATE (PF) 100 MCG/2ML IJ SOLN
INTRAMUSCULAR | Status: AC
  Filled 2021-09-30: qty 2

## 2021-09-30 MED ORDER — ATORVASTATIN CALCIUM 80 MG PO TABS
80.0000 mg | ORAL_TABLET | Freq: Every day | ORAL | Status: DC
  Administered 2021-09-30 – 2021-10-01 (×2): 80 mg via ORAL
  Filled 2021-09-30 (×2): qty 1

## 2021-09-30 MED ORDER — LIDOCAINE HCL (PF) 1 % IJ SOLN
INTRAMUSCULAR | Status: DC | PRN
  Administered 2021-09-30: 2 mL

## 2021-09-30 MED ORDER — HEPARIN SODIUM (PORCINE) 1000 UNIT/ML IJ SOLN
INTRAMUSCULAR | Status: DC | PRN
  Administered 2021-09-30: 6000 [IU] via INTRAVENOUS
  Administered 2021-09-30: 2000 [IU] via INTRAVENOUS
  Administered 2021-09-30: 4500 [IU] via INTRAVENOUS

## 2021-09-30 MED ORDER — IOHEXOL 350 MG/ML SOLN
INTRAVENOUS | Status: DC | PRN
  Administered 2021-09-30: 115 mL

## 2021-09-30 MED ORDER — ASPIRIN 81 MG PO CHEW: 324.0000 mg | CHEWABLE_TABLET | Freq: Once | ORAL | Status: DC

## 2021-09-30 MED ORDER — SODIUM CHLORIDE 0.9% FLUSH: 3.0000 mL | Freq: Two times a day (BID) | INTRAVENOUS | Status: DC

## 2021-09-30 MED ORDER — MIDAZOLAM HCL 2 MG/2ML IJ SOLN
INTRAMUSCULAR | Status: DC | PRN
  Administered 2021-09-30: 1 mg via INTRAVENOUS
  Administered 2021-09-30: 2 mg via INTRAVENOUS

## 2021-09-30 MED ORDER — ACETAMINOPHEN 325 MG PO TABS: 650.0000 mg | ORAL_TABLET | ORAL | Status: DC | PRN

## 2021-09-30 MED ORDER — PANTOPRAZOLE SODIUM 40 MG PO TBEC
40.0000 mg | DELAYED_RELEASE_TABLET | Freq: Every day | ORAL | Status: DC
  Administered 2021-10-01: 40 mg via ORAL
  Filled 2021-09-30: qty 1

## 2021-09-30 MED ORDER — NITROGLYCERIN 0.4 MG SL SUBL
0.4000 mg | SUBLINGUAL_TABLET | SUBLINGUAL | Status: DC | PRN
  Administered 2021-10-01: 0.4 mg via SUBLINGUAL
  Filled 2021-09-30: qty 1

## 2021-09-30 MED ORDER — TICAGRELOR 90 MG PO TABS
90.0000 mg | ORAL_TABLET | Freq: Two times a day (BID) | ORAL | Status: DC
  Administered 2021-10-01: 90 mg via ORAL
  Filled 2021-09-30: qty 1

## 2021-09-30 MED ORDER — SODIUM CHLORIDE 0.9 % IV SOLN
INTRAVENOUS | Status: AC | PRN
  Administered 2021-09-30: 10 mL/h via INTRAVENOUS
  Administered 2021-09-30: 250 mL

## 2021-09-30 MED ORDER — ALUM & MAG HYDROXIDE-SIMETH 200-200-20 MG/5ML PO SUSP
30.0000 mL | Freq: Once | ORAL | Status: AC
  Administered 2021-09-30: 30 mL via ORAL
  Filled 2021-09-30: qty 30

## 2021-09-30 MED ORDER — HEPARIN (PORCINE) IN NACL 1000-0.9 UT/500ML-% IV SOLN
INTRAVENOUS | Status: DC | PRN
  Administered 2021-09-30 (×2): 500 mL

## 2021-09-30 MED ORDER — NITROGLYCERIN 1 MG/10 ML FOR IR/CATH LAB
INTRA_ARTERIAL | Status: DC | PRN
  Administered 2021-09-30: 200 ug via INTRACORONARY
  Administered 2021-09-30: 400 ug via INTRA_ARTERIAL

## 2021-09-30 MED ORDER — LIDOCAINE HCL (PF) 1 % IJ SOLN
INTRAMUSCULAR | Status: AC
  Filled 2021-09-30: qty 30

## 2021-09-30 MED ORDER — FENTANYL CITRATE (PF) 100 MCG/2ML IJ SOLN
INTRAMUSCULAR | Status: DC | PRN
Start: 2021-09-30 — End: 2021-09-30
  Administered 2021-09-30 (×2): 25 ug via INTRAVENOUS

## 2021-09-30 MED ORDER — TICAGRELOR 90 MG PO TABS
ORAL_TABLET | ORAL | Status: DC | PRN
  Administered 2021-09-30: 180 mg via ORAL

## 2021-09-30 MED ORDER — VERAPAMIL HCL 2.5 MG/ML IV SOLN
INTRAVENOUS | Status: AC
  Filled 2021-09-30: qty 2

## 2021-09-30 SURGICAL SUPPLY — 20 items
BALL SAPPHIRE NC24 3.0X8 (BALLOONS) ×2
BALLN SAPPHIRE 2.25X15 (BALLOONS) ×2
BALLOON SAPPHIRE 2.25X15 (BALLOONS) IMPLANT
BALLOON SAPPHIRE NC24 3.0X8 (BALLOONS) IMPLANT
BAND CMPR LRG ZPHR (HEMOSTASIS) ×1
BAND ZEPHYR COMPRESS 30 LONG (HEMOSTASIS) ×1 IMPLANT
CATH 5FR JL3.5 JR4 ANG PIG MP (CATHETERS) ×1 IMPLANT
CATH VISTA GUIDE 6FR JR4 (CATHETERS) ×1 IMPLANT
GLIDESHEATH SLEND SS 6F .021 (SHEATH) ×1 IMPLANT
GUIDEWIRE INQWIRE 1.5J.035X260 (WIRE) IMPLANT
INQWIRE 1.5J .035X260CM (WIRE) ×2
KIT ENCORE 26 ADVANTAGE (KITS) ×1 IMPLANT
KIT HEART LEFT (KITS) ×2 IMPLANT
PACK CARDIAC CATHETERIZATION (CUSTOM PROCEDURE TRAY) ×2 IMPLANT
STENT SYNERGY XD 2.50X16 (Permanent Stent) IMPLANT
SYNERGY XD 2.50X16 (Permanent Stent) ×2 IMPLANT
TRANSDUCER W/STOPCOCK (MISCELLANEOUS) ×2 IMPLANT
TUBING CIL FLEX 10 FLL-RA (TUBING) ×2 IMPLANT
VALVE COPILOT STAT (MISCELLANEOUS) ×1 IMPLANT
WIRE ASAHI PROWATER 180CM (WIRE) ×1 IMPLANT

## 2021-09-30 NOTE — H&P (Addendum)
Cardiology H&P:   Patient ID: Andre Martinez MRN: [id number]; DOB: 1955/07/02  Admit date: 09/30/2021 Date of Consult: 09/30/2021  PCP:  Denita Lung, MD   [facility] Providers Cardiologist:  [facility] (Dr. Gardiner Rhyme)   Patient Profile:   Andre Martinez is a 66 y.o. male with a history of premature beats, hypertension, hyperlipidemia, GERD, and arthritis but no known cardiac history who is being seen for the evaluation of chest pain at the request of Dr. Tyrone Nine.  History of Present Illness:   Andre Martinez is 65 year old male with the above history. No known cardiac history. He has a history of premature heart beats but no other known cardiac history. He states his PCP ordered a stress test many years ago which was reportedly normal.  Patient presents today to the ED for further evaluation of chest pain. Patient was in his usual state of health until this morning.  He got up and went to the grocery store and then came home and unloaded groceries.  Shortly after this, he started to have burning in his chest. He initially thought it was his usual reflux but then it intensified and got very severe. It radiated across his chest and down both arms as well as up to his neck.  He had some associated nausea, clamminess, and lightheadedness with this but no shortness of breath or vomiting. He laid down on the floor because he thought that he was going to pass out.  He denies any syncope though.  The pain persisted and he ultimately called 911.  He took 4 baby aspirin before EMS arrived.  In route to the ED, he was given 1 dose of sublingual nitroglycerin.  He states this helped him relax some but did not really do anything to help his chest discomfort.  Pain finally improved some in the emergency department after receiving Maalox.  He ranks the pain as a 8/10 at its worst.  He denies any shortness of breath, orthopnea, PND, significant lower extremity edema.  He occasionally will have a fluttering  sensation with his premature beats but no sustained/prolonged palpitations.  No other light headedness or dizziness other than with the episode of chest pain this morning.  Patient is wife recently got back from a [place] cruise on 09/18/2021.  Shortly after getting home, he developed bronchitis with a cough.  He never tested for COVID but he states cough has essentially resolved.  He states he had a temperature of 99 1 today but no true fevers.  No GI symptoms.  In the ED, vitals stable. EKG showed sinus bradycardia, rate 59 bpm, with abnormal ST changes in inferior leads with 47m ST elevation in lead III and ST depression in aVL and V2-V3 (does not meet STEMI criteria). Repeat EKG showed resolution of ST changes. Initial high-sensitivity troponin 39. Repeat pending. D-dimer normal. Chest x-ray showed no acute findings. WBC 8.8, Hgb 13.8, Plts 279. Na 139, K 3.8, Glucose 129, BUN 15, Cr 1.03. Albumin 3.2, AST 21, ALT 21, Alk Phos 79, Total Bili 0.8. Lipase normal.  Cardiology consulted for further evaluation.  At the time of this evaluation, patient resting comfortably no acute acute distress.  He reports minimal chest burning at this time that he ranks as a 2/10 on the pain scale.  It currently feels like his usual reflux.  Patient has solid some tobacco use where he will smoke a cigar maybe 4 times a year.  He has a family history  of heart disease with both grandfathers having a heart attack in his maternal uncle having multiple heart attacks.  No heart disease in his parents or siblings.   Past Medical History:  Diagnosis Date   Allergy    Arthritis    GERD (gastroesophageal reflux disease)    Hemorrhoids    Hypertension    Sinusitis 12/94 3/95    Past Surgical History:  Procedure Laterality Date   COLONOSCOPY  2006   DR.HAYES   rectal fissure     REPAIR   SKIN BIOPSY Left 07/25/2018   see report in chat   SKIN BIOPSY Left 07/08/2021   dysplastic compound nevs with severe  atypia margin close   TONSILLECTOMY     TOTAL KNEE ARTHROPLASTY Left 05/21/2018   Procedure: LEFT TOTAL KNEE ARTHROPLASTY;  Surgeon: Gaynelle Arabian, MD;  Location: [facility];  Service: Orthopedics;  Laterality: Left;  96mn   UPPER GI ENDOSCOPY  04/08/2013   normal esophagus,multiple gastric polyps,normal examined duodenum     Home Medications:  Prior to Admission medications   Medication Sig Start Date End Date Taking? Authorizing Provider  atorvastatin (LIPITOR) 20 MG tablet Take 1 tablet (20 mg total) by mouth daily. 04/15/21 04/15/22 Yes LDenita Lung MD  metoprolol succinate (TOPROL-XL) 50 MG 24 hr tablet Take 1 tablet (50 mg total) by mouth daily. Take with or immediately following a meal. 04/15/21  Yes LDenita Lung MD  Multiple Vitamin (MULTIVITAMIN) tablet Take 1 tablet by mouth daily.   Yes [provider]  omeprazole (PRILOSEC) 40 MG capsule Take 40 mg by mouth daily. 05/03/21  Yes [provider]  tadalafil (CIALIS) 20 MG tablet Take 1 tablet (20 mg total) by mouth daily as needed for erectile dysfunction. Patient not taking: Reported on 05/12/2020 04/13/20   LDenita Lung MD    Inpatient Medications: Scheduled Meds:  aspirin  324 mg Oral Once   Continuous Infusions:  PRN Meds: nitroGLYCERIN  Allergies:    Allergies  Allergen Reactions   Codeine Nausea Only   Penicillins Rash    DID THE REACTION INVOLVE: Swelling of the face/tongue/throat, SOB, or low BP? No Sudden or severe rash/hives, skin peeling, or the inside of the mouth or nose? No Did it require medical treatment? No When did it last happen?   Childhood allergy If all above answers are "NO", may proceed with cephalosporin use.     Social History:   Social History   Socioeconomic History   Marital status: Married    Spouse name: Not on file   Number of children: Not on file   Years of education: Not on file   Highest education level: Not on file  Occupational History   Not on  file  Tobacco Use   Smoking status: Former    Types: Cigars   Smokeless tobacco: Never  Vaping Use   Vaping Use: Never used  Substance and Sexual Activity   Alcohol use: Yes    Alcohol/week: 3.0 standard drinks    Types: 3 Cans of beer per week   Drug use: No   Sexual activity: Yes  Other Topics Concern   Not on file  Social History Narrative   Not on file   Social Determinants of Health   Financial Resource Strain: Not on file  Food Insecurity: Not on file  Transportation Needs: Not on file  Physical Activity: Not on file  Stress: Not on file  Social Connections: Not on file  Intimate Partner Violence:  Not on file    Family History:   Family History  Problem Relation Age of Onset   Hypertension Mother    Diabetes Mother    Heart disease Father    Hypertension Father    Heart disease Paternal Grandfather      ROS:  Please see the history of present illness.  All other ROS reviewed and negative.     Physical Exam/Data:   Vitals:   09/30/21 1445 09/30/21 1500 09/30/21 1515 09/30/21 1530  BP: 105/72 110/74 (!) 118/96 133/81  Pulse: (!) 54 (!) 55 63 68  Resp: '13 18 18 12  ' Temp:      SpO2: 100% 100% 100% 99%  Weight:      Height:       No intake or output data in the 24 hours ending 09/30/21 1618    09/30/2021    1:06 PM 05/24/2021    1:02 PM 04/15/2021   10:03 AM  Last 3 Weights  Weight (lbs) 195 lb 198 lb 198 lb  Weight (kg) 88.451 kg 89.812 kg 89.812 kg     Body mass index is 25.04 kg/m.  General: 66 y.o. Caucasian male resting comfortably in no acute distress. HEENT: Normocephalic and atraumatic. Sclera clear.  Neck: Supple. No carotid bruits. No JVD. Heart: RRR. Distinct S1 and S2. No murmurs, gallops, or rubs. Radial pulses 2+ and equal bilaterally. Lungs: No increased work of breathing. Clear to ausculation bilaterally. No wheezes, rhonchi, or rales.  Abdomen: Soft, non-distended, and non-tender to palpation.  Extremities: No lower extremity  edema.    Skin: Warm and dry. Neuro: Alert and oriented x3. No focal deficits. Psych: Normal affect. Responds appropriately.  EKG:  The EKG was personally reviewed and demonstrates:  Initial EKG showed sinus bradycardia, rate 59 bpm, with abnormal ST changes in inferior leads with 32m ST elevation in lead III and ST depression in aVL and V2-V3  (does not meet STEMI criteria). Repeat EKG showed resolution of ST changes. Telemetry:  Telemetry was personally reviewed and demonstrates:  Sinus rhythm with rates in the 50s to 70s. PVCs and short runs of NSVT (up to 3 beats) noted.  Relevant CV Studies: N/A  Laboratory Data:  High Sensitivity Troponin:   Recent Labs  Lab 09/30/21 1312  TROPONINIHS 39*     Chemistry Recent Labs  Lab 09/30/21 1312  NA 139  K 3.8  CL 107  CO2 24  GLUCOSE 129*  BUN 15  CREATININE 1.03  CALCIUM 8.7*  GFRNONAA >60  ANIONGAP 8    Recent Labs  Lab 09/30/21 1312  PROT 5.7*  ALBUMIN 3.2*  AST 21  ALT 21  ALKPHOS 79  BILITOT 0.8   Lipids No results for input(s): CHOL, TRIG, HDL, LABVLDL, LDLCALC, CHOLHDL in the last 168 hours.  Hematology Recent Labs  Lab 09/30/21 1312  WBC 8.8  RBC 4.50  HGB 13.8  HCT 40.7  MCV 90.4  MCH 30.7  MCHC 33.9  RDW 12.8  PLT 279   Thyroid No results for input(s): TSH, FREET4 in the last 168 hours.  BNPNo results for input(s): BNP, PROBNP in the last 168 hours.  DDimer  Recent Labs  Lab 09/30/21 1312  DDIMER <0.27     Radiology/Studies:  DG Chest Port 1 View  Result Date: 09/30/2021 CLINICAL DATA:  66year old male present is for evaluation of chest pain. EXAM: PORTABLE CHEST 1 VIEW COMPARISON:  Esophageal evaluation from 20/12. No recent comparison imaging is available. FINDINGS: EKG leads  project over the patient's chest. Trachea midline. Cardiomediastinal contours and hilar structures are normal. Lungs are clear. No visible pneumothorax.  No sign of gross effusion. On limited assessment there is no  acute skeletal process. IMPRESSION: No acute cardiopulmonary disease. Electronically Signed   By: Zetta Bills M.D.   On: 09/30/2021 13:48     Assessment and Plan:   Chest Pain Elevated Troponin Patient presented with sudden onset of chest burning with radiation to both arm and neck. Initial EKG showed concerning slight ST elevation in inferior leads and ST depression in leads I and V2-V3 but this did not meet criteria for a STEMI. Repeat EKG showed resolution of these changes. Initial high-sensitivity troponin minimally elevated at 39. Repeat pending. - Pain improved with Maalox but he is still having 2/10 chest pain. - Continue aspirin, beta-blocker, statin. - Will check an Echo. - Chest pain and initial EKG concerning for unstable angina/ACS. Will plan for cardiac catheterization today.   The patient understands that risks include but are not limited to stroke (1 in 1000), death (1 in 38), kidney failure [usually temporary] (1 in 500), bleeding (1 in 200), allergic reaction [possibly serious] (1 in 200), and agrees to proceed.   Hypertension BP mostly well controlled. - Continue Toprol-XL 50m daily.  Hyperlipidemia - Will check fasting lipid panel. - On Lipitor 245mdaily at home. Will go ahead and increase to 8022maily given concern for ACS.  - If cardiac catheterization does not show any significant CAD, may be able to resume home dose of Lipitor at discharge depending on what lipid panel shows.  GERD - On Omeprazole at home. Will start Protonix here.    Risk Assessment/Risk Scores:   TIMI Risk Score for Unstable Angina or Non-ST Elevation MI:   The patient's TIMI risk score is 4, which indicates a 20% risk of all cause mortality, new or recurrent myocardial infarction or need for urgent revascularization in the next 14 days.   For questions or updates, please contact [facility] consult [email or web address] for contact info under    Signed, CalDarreld McleanPA-C  09/30/2021 4:18 PM   Patient seen and examined.  Agree with above documentation.  On exam, patient is alert and oriented, regular rate and rhythm, no murmurs, lungs CTAB, no LE edema or JVD.  Presented with chest pain, initial EKG with subtle inferior ST elevations and ST depressions in lateral leads.  Resolved on subsequent EKG.  Continued to have ongoing chest pain.  Recommend cardiac catheterization for further evaluation.  ChrDonato HeinzD

## 2021-09-30 NOTE — Progress Notes (Signed)
Cardiology Office Note:    Date:  10/13/2021   ID:  Andre Martinez, DOB 06-03-1955, MRN [id number]  PCP:  Denita Lung, MD  Cardiologist:  Donato Heinz, MD  Electrophysiologist:  None   Referring MD: Denita Lung, MD   Chief Complaint: hospital follow-up of NSTEMI   History of Present Illness:    Andre Martinez is a 66 y.o. male with a history of CAD with recent NSTEMI s/p DES to RCA on 09/30/2021, PVCs, hypertension, hyperlipidemia, and GERD who is followed by Dr. Gardiner Rhyme and presents today for hospital follow-up of NSTEMI.  Patient was first seen by Dr. Gardiner Rhyme during recent hospitalization. He was admitted from 09/30/2021 to 10/01/2021 for NSTEMI after presenting with sudden onset of burning chest pain that radiated to both arms. LHC showed 50% stenosis of mid RCA followed by 90% stenosis of distal RCA but otherwise no CAD. He underwent successful PCI with DES to distal RCA lesion. Echo showed LVEF of 60-65% with normal wall motion and normal diastolic parameters. He was started on DAPT with Aspirin and Brilinta and home Lipitor was increased. He was noted to have some sinus bradycardia with short runs of NSVT. He was continued on home dose of Toprol-XL and a live monitor was placed prior to discharge.   Patient presents today for follow-up. Here with wife. Overall, patient is doing well since recent hospitalization. He is still wearing his heart monitor. He does continue have daily burning in his chest which seems to be worse after meals and better with exercise. This feels like his chronic reflux which he has had for years. He did have chest burning at time of recent NSTEMI but this was much more severe than what he is having now and radiated to both arms. He denies any radiation to arm with current symptoms. He has not had to take any Nitroglycerin. I think this is his reflux rather than recurrent angina and patient agrees. Otherwise, no cardiac symptoms - no shortness of  breath, orthopnea, PND, lower extremity edema, palpitations, lightheadedness/dizziness, or syncope. He is compliant with all his medications and is tolerating them well. He does have a large healing ecchymosis on his right forearm extending up past elbow. However, good radial pulse, grip strength, and sensation. No pain to palpation. Do not think he has a pseudoaneurysm.   Past Medical History:  Diagnosis Date   Allergy    Arthritis    GERD (gastroesophageal reflux disease)    Hemorrhoids    Hypertension    Sinusitis 12/94 3/95    Past Surgical History:  Procedure Laterality Date   COLONOSCOPY  2006   DR.HAYES   CORONARY STENT INTERVENTION N/A 09/30/2021   Procedure: CORONARY STENT INTERVENTION;  Surgeon: Jettie Booze, MD;  Location: [facility];  Service: Cardiovascular;  Laterality: N/A;   LEFT HEART CATH AND CORONARY ANGIOGRAPHY N/A 09/30/2021   Procedure: LEFT HEART CATH AND CORONARY ANGIOGRAPHY;  Surgeon: Jettie Booze, MD;  Location: [facility];  Service: Cardiovascular;  Laterality: N/A;   rectal fissure     REPAIR   SKIN BIOPSY Left 07/25/2018   see report in chat   SKIN BIOPSY Left 07/08/2021   dysplastic compound nevs with severe atypia margin close   TONSILLECTOMY     TOTAL KNEE ARTHROPLASTY Left 05/21/2018   Procedure: LEFT TOTAL KNEE ARTHROPLASTY;  Surgeon: Gaynelle Arabian, MD;  Location: [facility];  Service: Orthopedics;  Laterality: Left;  29mn   UPPER  GI ENDOSCOPY  04/08/2013   normal esophagus,multiple gastric polyps,normal examined duodenum    Current Medications: Current Meds  Medication Sig   aspirin 81 MG chewable tablet Chew 1 tablet (81 mg total) by mouth daily.   atorvastatin (LIPITOR) 40 MG tablet Take 1 tablet (40 mg total) by mouth daily.   metoprolol succinate (TOPROL-XL) 50 MG 24 hr tablet Take 1 tablet (50 mg total) by mouth daily. Take with or immediately following a meal.   Multiple Vitamin (MULTIVITAMIN) tablet Take 1  tablet by mouth daily.   nitroGLYCERIN (NITROSTAT) 0.4 MG SL tablet Place 1 tablet (0.4 mg total) under the tongue every 5 (five) minutes x 3 doses as needed for chest pain.   pantoprazole (PROTONIX) 40 MG tablet Take 1 tablet (40 mg total) by mouth daily.   pantoprazole (PROTONIX) 40 MG tablet Take 1 tablet (40 mg total) by mouth daily.   [DISCONTINUED] omeprazole (PRILOSEC) 40 MG capsule Take 40 mg by mouth daily.   [DISCONTINUED] pantoprazole (PROTONIX) 40 MG tablet Take 1 tablet (40 mg total) by mouth daily.   [DISCONTINUED] pantoprazole (PROTONIX) 40 MG tablet Take 1 tablet (40 mg total) by mouth daily.   [DISCONTINUED] ticagrelor (BRILINTA) 90 MG TABS tablet Take 1 tablet (90 mg total) by mouth 2 (two) times daily.     Allergies:   Codeine and Penicillins   Social History   Socioeconomic History   Marital status: Married    Spouse name: Not on file   Number of children: Not on file   Years of education: Not on file   Highest education level: Not on file  Occupational History   Not on file  Tobacco Use   Smoking status: Former    Types: Cigars   Smokeless tobacco: Never  Vaping Use   Vaping Use: Never used  Substance and Sexual Activity   Alcohol use: Yes    Alcohol/week: 3.0 standard drinks    Types: 3 Cans of beer per week   Drug use: No   Sexual activity: Yes  Other Topics Concern   Not on file  Social History Narrative   Not on file   Social Determinants of Health   Financial Resource Strain: Not on file  Food Insecurity: Not on file  Transportation Needs: Not on file  Physical Activity: Not on file  Stress: Not on file  Social Connections: Not on file     Family History: The patient's family history includes Diabetes in his mother; Heart disease in his father and paternal grandfather; Hypertension in his father and mother.  ROS:   Please see the history of present illness.     EKGs/Labs/Other Studies Reviewed:    The following studies were reviewed  today:  Left Cardiac Catheterization 09/30/2021:   Mid RCA lesion is 50% stenosed.   Dist RCA lesion is 90% stenosed.  A drug-eluting stent was successfully placed using a SYNERGY XD 2.50X16, postdilated to 3.0 mm.   Post intervention, there is a 0% residual stenosis.   The left ventricular systolic function is normal.   LV end diastolic pressure is normal.   The left ventricular ejection fraction is 55-65% by visual estimate.   There is no aortic valve stenosis.   Continue dual antiplatelet therapy along with aggressive secondary prevention.  He will need high-dose statin, healthy diet and regular exercise.   Results called to the wife.    Diagnostic Dominance: Right Intervention    _______________  Echocardiogram 10/01/2021: Impressions: 1. Left ventricular ejection  fraction, by estimation, is 60 to 65%. The  left ventricle has normal function. The left ventricle has no regional  wall motion abnormalities. Left ventricular diastolic parameters were  normal.   2. Right ventricular systolic function is normal. The right ventricular  size is normal.   3. The mitral valve is normal in structure. No evidence of mitral valve  regurgitation. No evidence of mitral stenosis.   4. The aortic valve is tricuspid. There is mild calcification of the  aortic valve. There is mild thickening of the aortic valve. Aortic valve  regurgitation is not visualized. Aortic valve sclerosis is present, with  no evidence of aortic valve stenosis.   5. The inferior vena cava is normal in size with greater than 50%  respiratory variability, suggesting right atrial pressure of 3 mmHg.   EKG:  EKG ordered today. EKG personally reviewed and demonstrates normal sinus rhythm, rate 62 bpm, with T wave inversions in leads III and aVF. Left axis deviation. Normal PR and QRS intervals. QTc 424 ms.  Recent Labs: 09/30/2021: ALT 21 10/01/2021: BUN 14; Creatinine, Ser 0.84; Hemoglobin 13.3; Platelets 253; Potassium  3.8; Sodium 139  Recent Lipid Panel    Component Value Date/Time   CHOL 107 10/01/2021 0152   CHOL 207 (H) 04/15/2021 1056   TRIG 104 10/01/2021 0152   HDL 35 (L) 10/01/2021 0152   HDL 53 04/15/2021 1056   CHOLHDL 3.1 10/01/2021 0152   VLDL 21 10/01/2021 0152   LDLCALC 51 10/01/2021 0152   LDLCALC 134 (H) 04/15/2021 1056    Physical Exam:    Vital Signs: BP 126/83   Pulse 62   Ht '6\' 2"'$  (1.88 m)   Wt 194 lb (88 kg)   SpO2 98%   BMI 24.91 kg/m     Wt Readings from Last 3 Encounters:  10/13/21 194 lb (88 kg)  09/30/21 195 lb (88.5 kg)  05/24/21 198 lb (89.8 kg)     General: 66 y.o. Caucasian male in no acute distress. HEENT: Normocephalic and atraumatic. Sclera clear.  Neck: Supple.  No JVD. Heart: RRR. Distinct S1 and S2. No murmurs, gallops, or rubs. Large healing ecchymosis extending up his right forearm proximal to cath site. Ecchymosis soft. Radial pulse 2+ bilateral. Normal grip strength and sensation. Lungs: No increased work of breathing. Clear to ausculation bilaterally. No wheezes, rhonchi, or rales.  Abdomen: Soft, non-distended, and non-tender to palpation.  MSK: Normal strength and tone for age.  Extremities: No lower extremity edema.    Skin: Warm and dry. Neuro: Alert and oriented x3. No focal deficits. Psych: Normal affect. Responds appropriately.  Assessment:    1. Coronary artery disease involving native coronary artery of native heart without angina pectoris   2. History of non-ST elevation myocardial infarction (NSTEMI)   3. Sinus bradycardia   4. Frequent PVCs   5. Primary hypertension   6. Hyperlipidemia, unspecified hyperlipidemia type     Plan:    CAD with Recent NSTEMI Patient recently admitted with NSTEMI s/p DES to distal RCA.  - He reports chest burning which I suspect is do to his chronic reflux. Worse with meals and better with exercise. Do no think this is recurrent angina. - Continue DAPT with Aspirin and Brilinta.  - Continue  beta-blocker and high-intensity statin.  - Will stop Omeprazole and start Protonix '40mg'$  daily. Advised patient to let us know if chest burning does not improve with this. - Continue to increase activity as tolerated. Recommended Cardiac Rehab.  Sinus Bradycardia Frequent PVCs Non-Sustained VT History of sinus bradycardia, frequent PVCc, and non-sustained VT. Live Zio Monitor was placed during recent admission. He is still wearing this. - Continue Toprol-XL '50mg'$  daily.  - Will wait for full monitor results.  Hypertension BP well controlled. - Continue Toprol-XL '50mg'$  daily.   Hyperlipidemia Lipid panel during recent admission: Total Cholesterol 107, Triglycerides 104, HDL 35, LDL 51. Lipoprotein A 205.5.  - Continue Lipitor '40mg'$  daily.  Disposition: Follow up in 3 months.   Medication Adjustments/Labs and Tests Ordered: Current medicines are reviewed at length with the patient today.  Concerns regarding medicines are outlined above.  Orders Placed This Encounter  Procedures   EKG 12-Lead   Meds ordered this encounter  Medications   DISCONTD: pantoprazole (PROTONIX) 40 MG tablet    Sig: Take 1 tablet (40 mg total) by mouth daily.    Dispense:  90 tablet    Refill:  3   ticagrelor (BRILINTA) 90 MG TABS tablet    Sig: Take 1 tablet (90 mg total) by mouth 2 (two) times daily.    Dispense:  60 tablet    Refill:  11   DISCONTD: pantoprazole (PROTONIX) 40 MG tablet    Sig: Take 1 tablet (40 mg total) by mouth daily.    Dispense:  30 tablet    Refill:  11   pantoprazole (PROTONIX) 40 MG tablet    Sig: Take 1 tablet (40 mg total) by mouth daily.    Dispense:  30 tablet    Refill:  0   pantoprazole (PROTONIX) 40 MG tablet    Sig: Take 1 tablet (40 mg total) by mouth daily.    Dispense:  90 tablet    Refill:  3    Patient Instructions  Medication Instructions:  Stop Omeprazole. Start Protonix 40 mg ( Take 1 Tablet Daily). *If you need a refill on your cardiac medications  before your next appointment, please call your pharmacy*   Lab Work: No Labs If you have labs (blood work) drawn today and your tests are completely normal, you will receive your results only by: Naplate (if you have MyChart) OR A paper copy in the mail If you have any lab test that is abnormal or we need to change your treatment, we will call you to review the results.   Testing/Procedures: No Testing   Follow-Up: At [facility], you and your health needs are our priority.  As part of our continuing mission to provide you with exceptional heart care, we have created designated Provider Care Teams.  These Care Teams include your primary Cardiologist (physician) and Advanced Practice Providers (APPs -  Physician Assistants and Nurse Practitioners) who all work together to provide you with the care you need, when you need it.  We recommend signing up for the patient portal called "MyChart".  Sign up information is provided on this After Visit Summary.  MyChart is used to connect with patients for Virtual Visits (Telemedicine).  Patients are able to view lab/test results, encounter notes, upcoming appointments, etc.  Non-urgent messages can be sent to your provider as well.   To learn more about what you can do with MyChart, go to [email or web address].    Your next appointment:   3 month(s)  The format for your next appointment:   In Person  Provider:   Sande Rives, PA-C    or Donato Heinz, MD   1}      Important Information About Sugar  Signed, Darreld Mclean, PA-C  10/13/2021 10:41 PM     [facility]

## 2021-09-30 NOTE — ED Provider Notes (Signed)
[facility] EMERGENCY DEPARTMENT Provider Note   CSN: [id number] Arrival date & time: 09/30/21  1253     History  Chief Complaint  Patient presents with   Chest Pain    Andre Martinez is a 66 y.o. male.  66 yo M with a chief complaint of chest pain.  Described as a burning this was across the anterior portion of the chest and radiated down both arms.  He got mildly diaphoretic and felt nauseated.  Had some very minimal shortness of breath with it.  This was reminiscent of when he had had reflux in the past but was much more severe.  Symptoms have progressively improved since then.  He has been able to exercise in the past 48 hours without any symptoms.  Was recently in [place] about a week or so ago and had developed bronchitis but is improving from that.  Patient denies history of MI, denies hyperlipidemia diabetes or smoking.  Denies family history of MI. Grandfather with MI.  On metop for rate control but told he has had borderline HTN and this may help with both.  Patient denies history of PE or DVT denies hemoptysis denies unilateral lower extremity edema denies recent surgery,  hospitalization estrogen use or history of cancer. Within the month traveled to [place].      Chest Pain     Home Medications Prior to Admission medications   Medication Sig Start Date End Date Taking? Authorizing Provider  atorvastatin (LIPITOR) 20 MG tablet Take 1 tablet (20 mg total) by mouth daily. 04/15/21 04/15/22 Yes Denita Lung, MD  metoprolol succinate (TOPROL-XL) 50 MG 24 hr tablet Take 1 tablet (50 mg total) by mouth daily. Take with or immediately following a meal. 04/15/21  Yes Denita Lung, MD  Multiple Vitamin (MULTIVITAMIN) tablet Take 1 tablet by mouth daily.   Yes [provider]  omeprazole (PRILOSEC) 40 MG capsule Take 40 mg by mouth daily. 05/03/21  Yes [provider]  tadalafil (CIALIS) 20 MG tablet Take 1 tablet (20 mg total) by mouth  daily as needed for erectile dysfunction. Patient not taking: Reported on 05/12/2020 04/13/20   Denita Lung, MD      Allergies    Codeine and Penicillins    Review of Systems   Review of Systems  Cardiovascular:  Positive for chest pain.   Physical Exam Updated Vital Signs BP 123/84   Pulse 65   Temp 97.9 F (36.6 C)   Resp 14   Ht '6\' 2"'$  (1.88 m)   Wt 88.5 kg   SpO2 100%   BMI 25.04 kg/m  Physical Exam Vitals and nursing note reviewed.  Constitutional:      Appearance: He is well-developed.  HENT:     Head: Normocephalic and atraumatic.  Eyes:     Pupils: Pupils are equal, round, and reactive to light.  Neck:     Vascular: No JVD.  Cardiovascular:     Rate and Rhythm: Normal rate and regular rhythm.     Heart sounds: No murmur heard.   No friction rub. No gallop.  Pulmonary:     Effort: No respiratory distress.     Breath sounds: No wheezing.  Abdominal:     General: There is no distension.     Tenderness: There is no abdominal tenderness. There is no guarding or rebound.  Musculoskeletal:        General: Normal range of motion.     Cervical back: Normal  range of motion and neck supple.  Skin:    Coloration: Skin is not pale.     Findings: No rash.  Neurological:     Mental Status: He is alert and oriented to person, place, and time.  Psychiatric:        Behavior: Behavior normal.    ED Results / Procedures / Treatments   Labs (all labs ordered are listed, but only abnormal results are displayed) Labs Reviewed  COMPREHENSIVE METABOLIC PANEL - Abnormal; Notable for the following components:      Result Value   Glucose, Bld 129 (*)    Calcium 8.7 (*)    Total Protein 5.7 (*)    Albumin 3.2 (*)    All other components within normal limits  TROPONIN I (HIGH SENSITIVITY) - Abnormal; Notable for the following components:   Troponin I (High Sensitivity) 39 (*)    All other components within normal limits  CBC WITH DIFFERENTIAL/PLATELET  LIPASE, BLOOD   D-DIMER, QUANTITATIVE  TROPONIN I (HIGH SENSITIVITY)    EKG EKG Interpretation  Date/Time:  Thursday Sep 30 2021 13:01:40 EDT Ventricular Rate:  59 PR Interval:  173 QRS Duration: 93 QT Interval:  433 QTC Calculation: 429 R Axis:   34 Text Interpretation: Sinus rhythm Minimal ST depression, anterolateral leads ST elevation, consider inferior injury No old tracing to compare Confirmed by Deno Etienne [phone]) on 09/30/2021 1:23:22 PM  Radiology DG Chest Port 1 View  Result Date: 09/30/2021 CLINICAL DATA:  66 year old male present is for evaluation of chest pain. EXAM: PORTABLE CHEST 1 VIEW COMPARISON:  Esophageal evaluation from 20/12. No recent comparison imaging is available. FINDINGS: EKG leads project over the patient's chest. Trachea midline. Cardiomediastinal contours and hilar structures are normal. Lungs are clear. No visible pneumothorax.  No sign of gross effusion. On limited assessment there is no acute skeletal process. IMPRESSION: No acute cardiopulmonary disease. Electronically Signed   By: Zetta Bills M.D.   On: 09/30/2021 13:48    Procedures Procedures    Medications Ordered in ED Medications  aspirin chewable tablet 324 mg (324 mg Oral Not Given 09/30/21 1329)  nitroGLYCERIN (NITROSTAT) SL tablet 0.4 mg (has no administration in time range)  alum & mag hydroxide-simeth (MAALOX/MYLANTA) 200-200-20 MG/5ML suspension 30 mL (30 mLs Oral Given 09/30/21 1344)    ED Course/ Medical Decision Making/ A&P                           Medical Decision Making Amount and/or Complexity of Data Reviewed Labs: ordered. Radiology: ordered.  Risk OTC drugs.   66 yo M with a chief complaint of chest discomfort.  Described as a burning.  Occurred while unloading the groceries.  Sounds like he is fairly active and has not had any significant symptoms with exercise.  Seems less likely to be an MI we will obtain 2 troponins D-dimer with recent travel to [place] GI cocktail  reassess.  Chest x-ray independently interpreted by me without focal infiltrate or pneumothorax.  No anemia.  My review of the EKG is different than what he had had done previously in his family doctor's office.  He has ST depression in the anterior leads and ST changes in the inferior leads.  Not meeting criteria for ST elevation MI.  Has a pattern somewhat consistent with the posterior MI.  Patient continues to improve significantly.  Had some improvement with Maalox.  Now is about a 2 out of 10.  His  troponin came back and is mildly elevated.  Discussed with Dr. Johney Frame, cardiology will come and see at bedside.  CRITICAL CARE Performed by: Cecilio Asper   Total critical care time: 35 minutes  Critical care time was exclusive of separately billable procedures and treating other patients.  Critical care was necessary to treat or prevent imminent or life-threatening deterioration.  Critical care was time spent personally by me on the following activities: development of treatment plan with patient and/or surrogate as well as nursing, discussions with consultants, evaluation of patient's response to treatment, examination of patient, obtaining history from patient or surrogate, ordering and performing treatments and interventions, ordering and review of laboratory studies, ordering and review of radiographic studies, pulse oximetry and re-evaluation of patient's condition.  The patients results and plan were reviewed and discussed.   Any x-rays performed were independently reviewed by myself.   Differential diagnosis were considered with the presenting HPI.  Medications  aspirin chewable tablet 324 mg (324 mg Oral Not Given 09/30/21 1329)  nitroGLYCERIN (NITROSTAT) SL tablet 0.4 mg (has no administration in time range)  alum & mag hydroxide-simeth (MAALOX/MYLANTA) 200-200-20 MG/5ML suspension 30 mL (30 mLs Oral Given 09/30/21 1344)    Vitals:   09/30/21 1306 09/30/21 1315  09/30/21 1330 09/30/21 1345  BP:  114/81 (!) 150/81 123/84  Pulse:  (!) 46 66 65  Resp:  '10 17 14  '$ Temp:      SpO2:  100% 100% 100%  Weight: 88.5 kg     Height: '6\' 2"'$  (1.88 m)       Final diagnoses:  Chest pain with moderate risk for cardiac etiology  Abnormal ECG  Troponin level elevated    Admission/ observation were discussed with the admitting physician, patient and/or family and they are comfortable with the plan.         Final Clinical Impression(s) / ED Diagnoses Final diagnoses:  Chest pain with moderate risk for cardiac etiology  Abnormal ECG  Troponin level elevated    Rx / DC Orders ED Discharge Orders     None         Deno Etienne, DO 09/30/21 1444

## 2021-09-30 NOTE — Progress Notes (Signed)
error 

## 2021-09-30 NOTE — Interval H&P Note (Signed)
Cath Lab Visit (complete for each Cath Lab visit)  Clinical Evaluation Leading to the Procedure:   ACS: Yes.    Non-ACS:    Anginal Classification: CCS IV  Anti-ischemic medical therapy: Minimal Therapy (1 class of medications)  Non-Invasive Test Results: No non-invasive testing performed  Prior CABG: No previous CABG      History and Physical Interval Note:  09/30/2021 4:49 PM  Andre Martinez  has presented today for surgery, with the diagnosis of chest pain, abnormal EKG.  The various methods of treatment have been discussed with the patient and family. After consideration of risks, benefits and other options for treatment, the patient has consented to  Procedure(s): LEFT HEART CATH AND CORONARY ANGIOGRAPHY (N/A) as a surgical intervention.  The patient's history has been reviewed, patient examined, no change in status, stable for surgery.  I have reviewed the patient's chart and labs.  Questions were answered to the patient's satisfaction.     Larae Grooms

## 2021-09-30 NOTE — Consult Note (Deleted)
Cardiology Consultation:   Patient ID: Andre Martinez MRN: [id number]; DOB: 05-26-1955  Admit date: 09/30/2021 Date of Consult: 09/30/2021  PCP:  Denita Lung, MD   [facility] Providers Cardiologist:  New (Dr. Gardiner Rhyme)   Patient Profile:   Andre Martinez is a 66 y.o. male with a history of premature beats, hypertension, hyperlipidemia, GERD, and arthritis but no known cardiac history who is being seen for the evaluation of chest pain at the request of Dr. Tyrone Nine.  History of Present Illness:   Andre Martinez is 66 year old male with the above history. No known cardiac history. He has a history of premature heart beats but no other known cardiac history. He states his PCP ordered a stress test many years ago which was reportedly normal.  Patient presents today to the ED for further evaluation of chest pain. Patient was in his usual state of health until this morning.  He got up and went to the grocery store and then came home and unloaded groceries.  Shortly after this, he started to have burning in his chest. He initially thought it was his usual reflux but then it intensified and got very severe. It radiated across his chest and down both arms as well as up to his neck.  He had some associated nausea, clamminess, and lightheadedness with this but no shortness of breath or vomiting. He laid down on the floor because he thought that he was going to pass out.  He denies any syncope though.  The pain persisted and he ultimately called 911.  He took 4 baby aspirin before EMS arrived.  In route to the ED, he was given 1 dose of sublingual nitroglycerin.  He states this helped him relax some but did not really do anything to help his chest discomfort.  Pain finally improved some in the emergency department after receiving Maalox.  He ranks the pain as a 8/10 at its worst.  He denies any shortness of breath, orthopnea, PND, significant lower extremity edema.  He occasionally will have a  fluttering sensation with his premature beats but no sustained/prolonged palpitations.  No other light headedness or dizziness other than with the episode of chest pain this morning.  Patient is wife recently got back from a [place] cruise on 09/18/2021.  Shortly after getting home, he developed bronchitis with a cough.  He never tested for COVID but he states cough has essentially resolved.  He states he had a temperature of 99 1 today but no true fevers.  No GI symptoms.  In the ED, vitals stable. EKG showed sinus bradycardia, rate 59 bpm, with abnormal ST changes in inferior leads with 56m ST elevation in lead III and ST depression in aVL and V2-V3 (does not meet STEMI criteria). Repeat EKG showed resolution of ST changes. Initial high-sensitivity troponin 39. Repeat pending. D-dimer normal. Chest x-ray showed no acute findings. WBC 8.8, Hgb 13.8, Plts 279. Na 139, K 3.8, Glucose 129, BUN 15, Cr 1.03. Albumin 3.2, AST 21, ALT 21, Alk Phos 79, Total Bili 0.8. Lipase normal.  Cardiology consulted for further evaluation.  At the time of this evaluation, patient resting comfortably no acute acute distress.  He reports minimal chest burning at this time that he ranks as a 2/10 on the pain scale.  It currently feels like his usual reflux.  Patient has solid some tobacco use where he will smoke a cigar maybe 4 times a year.  He has a family history  of heart disease with both grandfathers having a heart attack in his maternal uncle having multiple heart attacks.  No heart disease in his parents or siblings.   Past Medical History:  Diagnosis Date   Allergy    Arthritis    GERD (gastroesophageal reflux disease)    Hemorrhoids    Hypertension    Sinusitis 12/94 3/95    Past Surgical History:  Procedure Laterality Date   COLONOSCOPY  2006   DR.HAYES   rectal fissure     REPAIR   SKIN BIOPSY Left 07/25/2018   see report in chat   SKIN BIOPSY Left 07/08/2021   dysplastic compound nevs  with severe atypia margin close   TONSILLECTOMY     TOTAL KNEE ARTHROPLASTY Left 05/21/2018   Procedure: LEFT TOTAL KNEE ARTHROPLASTY;  Surgeon: Gaynelle Arabian, MD;  Location: [facility];  Service: Orthopedics;  Laterality: Left;  64mn   UPPER GI ENDOSCOPY  04/08/2013   normal esophagus,multiple gastric polyps,normal examined duodenum     Home Medications:  Prior to Admission medications   Medication Sig Start Date End Date Taking? Authorizing Provider  atorvastatin (LIPITOR) 20 MG tablet Take 1 tablet (20 mg total) by mouth daily. 04/15/21 04/15/22 Yes LDenita Lung MD  metoprolol succinate (TOPROL-XL) 50 MG 24 hr tablet Take 1 tablet (50 mg total) by mouth daily. Take with or immediately following a meal. 04/15/21  Yes LDenita Lung MD  Multiple Vitamin (MULTIVITAMIN) tablet Take 1 tablet by mouth daily.   Yes [provider]  omeprazole (PRILOSEC) 40 MG capsule Take 40 mg by mouth daily. 05/03/21  Yes [provider]  tadalafil (CIALIS) 20 MG tablet Take 1 tablet (20 mg total) by mouth daily as needed for erectile dysfunction. Patient not taking: Reported on 05/12/2020 04/13/20   LDenita Lung MD    Inpatient Medications: Scheduled Meds:  aspirin  324 mg Oral Once   Continuous Infusions:  PRN Meds: nitroGLYCERIN  Allergies:    Allergies  Allergen Reactions   Codeine Nausea Only   Penicillins Rash    DID THE REACTION INVOLVE: Swelling of the face/tongue/throat, SOB, or low BP? No Sudden or severe rash/hives, skin peeling, or the inside of the mouth or nose? No Did it require medical treatment? No When did it last happen?   Childhood allergy If all above answers are "NO", may proceed with cephalosporin use.     Social History:   Social History   Socioeconomic History   Marital status: Married    Spouse name: Not on file   Number of children: Not on file   Years of education: Not on file   Highest education level: Not on file  Occupational  History   Not on file  Tobacco Use   Smoking status: Former    Types: Cigars   Smokeless tobacco: Never  Vaping Use   Vaping Use: Never used  Substance and Sexual Activity   Alcohol use: Yes    Alcohol/week: 3.0 standard drinks    Types: 3 Cans of beer per week   Drug use: No   Sexual activity: Yes  Other Topics Concern   Not on file  Social History Narrative   Not on file   Social Determinants of Health   Financial Resource Strain: Not on file  Food Insecurity: Not on file  Transportation Needs: Not on file  Physical Activity: Not on file  Stress: Not on file  Social Connections: Not on file  Intimate Partner Violence:  Not on file    Family History:   Family History  Problem Relation Age of Onset   Hypertension Mother    Diabetes Mother    Heart disease Father    Hypertension Father    Heart disease Paternal Grandfather      ROS:  Please see the history of present illness.  All other ROS reviewed and negative.     Physical Exam/Data:   Vitals:   09/30/21 1445 09/30/21 1500 09/30/21 1515 09/30/21 1530  BP: 105/72 110/74 (!) 118/96 133/81  Pulse: (!) 54 (!) 55 63 68  Resp: '13 18 18 12  ' Temp:      SpO2: 100% 100% 100% 99%  Weight:      Height:       No intake or output data in the 24 hours ending 09/30/21 1618    09/30/2021    1:06 PM 05/24/2021    1:02 PM 04/15/2021   10:03 AM  Last 3 Weights  Weight (lbs) 195 lb 198 lb 198 lb  Weight (kg) 88.451 kg 89.812 kg 89.812 kg     Body mass index is 25.04 kg/m.  General: 66 y.o. Caucasian male resting comfortably in no acute distress. HEENT: Normocephalic and atraumatic. Sclera clear.  Neck: Supple. No carotid bruits. No JVD. Heart: RRR. Distinct S1 and S2. No murmurs, gallops, or rubs. Radial pulses 2+ and equal bilaterally. Lungs: No increased work of breathing. Clear to ausculation bilaterally. No wheezes, rhonchi, or rales.  Abdomen: Soft, non-distended, and non-tender to palpation.  Extremities: No  lower extremity edema.    Skin: Warm and dry. Neuro: Alert and oriented x3. No focal deficits. Psych: Normal affect. Responds appropriately.  EKG:  The EKG was personally reviewed and demonstrates:  Initial EKG showed sinus bradycardia, rate 59 bpm, with abnormal ST changes in inferior leads with 46m ST elevation in lead III and ST depression in aVL and V2-V3  (does not meet STEMI criteria). Repeat EKG showed resolution of ST changes. Telemetry:  Telemetry was personally reviewed and demonstrates:  Sinus rhythm with rates in the 50s to 70s. PVCs and short runs of NSVT (up to 3 beats) noted.  Relevant CV Studies: N/A  Laboratory Data:  High Sensitivity Troponin:   Recent Labs  Lab 09/30/21 1312  TROPONINIHS 39*     Chemistry Recent Labs  Lab 09/30/21 1312  NA 139  K 3.8  CL 107  CO2 24  GLUCOSE 129*  BUN 15  CREATININE 1.03  CALCIUM 8.7*  GFRNONAA >60  ANIONGAP 8    Recent Labs  Lab 09/30/21 1312  PROT 5.7*  ALBUMIN 3.2*  AST 21  ALT 21  ALKPHOS 79  BILITOT 0.8   Lipids No results for input(s): CHOL, TRIG, HDL, LABVLDL, LDLCALC, CHOLHDL in the last 168 hours.  Hematology Recent Labs  Lab 09/30/21 1312  WBC 8.8  RBC 4.50  HGB 13.8  HCT 40.7  MCV 90.4  MCH 30.7  MCHC 33.9  RDW 12.8  PLT 279   Thyroid No results for input(s): TSH, FREET4 in the last 168 hours.  BNPNo results for input(s): BNP, PROBNP in the last 168 hours.  DDimer  Recent Labs  Lab 09/30/21 1312  DDIMER <0.27     Radiology/Studies:  DG Chest Port 1 View  Result Date: 09/30/2021 CLINICAL DATA:  66year old male present is for evaluation of chest pain. EXAM: PORTABLE CHEST 1 VIEW COMPARISON:  Esophageal evaluation from 20/12. No recent comparison imaging is available. FINDINGS: EKG leads  project over the patient's chest. Trachea midline. Cardiomediastinal contours and hilar structures are normal. Lungs are clear. No visible pneumothorax.  No sign of gross effusion. On limited  assessment there is no acute skeletal process. IMPRESSION: No acute cardiopulmonary disease. Electronically Signed   By: Zetta Bills M.D.   On: 09/30/2021 13:48     Assessment and Plan:   Chest Pain Elevated Troponin Patient presented with sudden onset of chest burning with radiation to both arm and neck. Initial EKG showed concerning slight ST elevation in inferior leads and ST depression in leads I and V2-V3 but this did not meet criteria for a STEMI. Repeat EKG showed resolution of these changes. Initial high-sensitivity troponin minimally elevated at 39. Repeat pending. - Pain improved with Maalox but he is still having 2/10 chest pain. - Continue aspirin, beta-blocker, statin. - Chest pain and initial EKG concerning for unstable angina/ACS. Will plan for cardiac catheterization today.   The patient understands that risks include but are not limited to stroke (1 in 1000), death (1 in 93), kidney failure [usually temporary] (1 in 500), bleeding (1 in 200), allergic reaction [possibly serious] (1 in 200), and agrees to proceed.   Hypertension BP mostly well controlled. - Continue Toprol-XL 63m daily.  Hyperlipidemia - Will check fasting lipid panel. - On Lipitor 276mdaily at home. Will go ahead and increase to 8046maily given concern for ACS.  - If cardiac catheterization does not show any significant CAD, may be able to resume home dose of Lipitor at discharge depending on what lipid panel shows.  GERD - On Omeprazole at home. Will start Protonix here.    Risk Assessment/Risk Scores:   TIMI Risk Score for Unstable Angina or Non-ST Elevation MI:   The patient's TIMI risk score is 4, which indicates a 20% risk of all cause mortality, new or recurrent myocardial infarction or need for urgent revascularization in the next 14 days.   For questions or updates, please contact [facility] consult [email or web address] for contact info under    Signed, CalDarreld McleanPA-C  09/30/2021 4:18 PM

## 2021-09-30 NOTE — ED Triage Notes (Signed)
Pt to ER via EMS from home with c/o midsternal chest pain described as indigestion that radiated to bilateral arms.  Pt also reports mild SHOB and nausea with pain. States pt is better at this time.

## 2021-10-01 ENCOUNTER — Other Ambulatory Visit (HOSPITAL_COMMUNITY): Payer: Self-pay

## 2021-10-01 ENCOUNTER — Encounter (HOSPITAL_COMMUNITY): Payer: Self-pay | Admitting: Interventional Cardiology

## 2021-10-01 ENCOUNTER — Observation Stay (HOSPITAL_BASED_OUTPATIENT_CLINIC_OR_DEPARTMENT_OTHER)
Admit: 2021-10-01 | Discharge: 2021-10-01 | Disposition: A | Discharge status: Home / Self Care | Payer: Medicare Other | Attending: Physician Assistant | Admitting: Physician Assistant

## 2021-10-01 ENCOUNTER — Observation Stay (HOSPITAL_BASED_OUTPATIENT_CLINIC_OR_DEPARTMENT_OTHER): Payer: Medicare Other

## 2021-10-01 DIAGNOSIS — R0609 Other forms of dyspnea: Secondary | ICD-10-CM

## 2021-10-01 DIAGNOSIS — I249 Acute ischemic heart disease, unspecified: Secondary | ICD-10-CM | POA: Diagnosis not present

## 2021-10-01 DIAGNOSIS — R001 Bradycardia, unspecified: Secondary | ICD-10-CM

## 2021-10-01 DIAGNOSIS — R9431 Abnormal electrocardiogram [ECG] [EKG]: Secondary | ICD-10-CM | POA: Diagnosis not present

## 2021-10-01 DIAGNOSIS — I2510 Atherosclerotic heart disease of native coronary artery without angina pectoris: Secondary | ICD-10-CM | POA: Diagnosis not present

## 2021-10-01 DIAGNOSIS — I4729 Other ventricular tachycardia: Secondary | ICD-10-CM

## 2021-10-01 DIAGNOSIS — Z79899 Other long term (current) drug therapy: Secondary | ICD-10-CM | POA: Diagnosis not present

## 2021-10-01 DIAGNOSIS — I10 Essential (primary) hypertension: Secondary | ICD-10-CM | POA: Diagnosis not present

## 2021-10-01 DIAGNOSIS — I214 Non-ST elevation (NSTEMI) myocardial infarction: Secondary | ICD-10-CM | POA: Diagnosis not present

## 2021-10-01 DIAGNOSIS — Z87891 Personal history of nicotine dependence: Secondary | ICD-10-CM | POA: Diagnosis not present

## 2021-10-01 DIAGNOSIS — E785 Hyperlipidemia, unspecified: Secondary | ICD-10-CM | POA: Diagnosis not present

## 2021-10-01 DIAGNOSIS — Z96652 Presence of left artificial knee joint: Secondary | ICD-10-CM | POA: Diagnosis not present

## 2021-10-01 DIAGNOSIS — R778 Other specified abnormalities of plasma proteins: Secondary | ICD-10-CM | POA: Diagnosis not present

## 2021-10-01 LAB — BASIC METABOLIC PANEL
Anion gap: 7 (ref 5–15)
BUN: 14 mg/dL (ref 8–23)
CO2: 23 mmol/L (ref 22–32)
Calcium: 8.4 mg/dL — ABNORMAL LOW (ref 8.9–10.3)
Chloride: 109 mmol/L (ref 98–111)
Creatinine, Ser: 0.84 mg/dL (ref 0.61–1.24)
GFR, Estimated: >60 mL/min (ref >60)
Glucose, Bld: 94 mg/dL (ref 70–99)
Potassium: 3.8 mmol/L (ref 3.5–5.1)
Sodium: 139 mmol/L (ref 135–145)

## 2021-10-01 LAB — LIPID PANEL
Cholesterol: 107 mg/dL (ref 0–200)
HDL: 35 mg/dL — ABNORMAL LOW (ref >40)
LDL Cholesterol: 51 mg/dL (ref 0–99)
Total CHOL/HDL Ratio: 3.1 RATIO
Triglycerides: 104 mg/dL (ref <150)
VLDL: 21 mg/dL (ref 0–40)

## 2021-10-01 LAB — ECHOCARDIOGRAM COMPLETE
AR max vel: 3.5 cm2
AV Area VTI: 3.38 cm2
AV Area mean vel: 3.39 cm2
AV Mean grad: 2 mmHg
AV Peak grad: 3.1 mmHg
Ao pk vel: 0.88 m/s
Area-P 1/2: 1.84 cm2
Height: 74 in
S' Lateral: 2.9 cm
Weight: 3120 oz

## 2021-10-01 LAB — CBC
HCT: 38.7 % — ABNORMAL LOW (ref 39.0–52.0)
Hemoglobin: 13.3 g/dL (ref 13.0–17.0)
MCH: 30.4 pg (ref 26.0–34.0)
MCHC: 34.4 g/dL (ref 30.0–36.0)
MCV: 88.4 fL (ref 80.0–100.0)
Platelets: 253 10*3/uL (ref 150–400)
RBC: 4.38 MIL/uL (ref 4.22–5.81)
RDW: 13 % (ref 11.5–15.5)
WBC: 12.4 10*3/uL — ABNORMAL HIGH (ref 4.0–10.5)
nRBC: 0 % (ref 0.0–0.2)

## 2021-10-01 LAB — POCT ACTIVATED CLOTTING TIME
Activated Clotting Time: 492 seconds
Activated Clotting Time: 269 seconds

## 2021-10-01 LAB — HEMOGLOBIN A1C
Hgb A1c MFr Bld: 5.8 % — ABNORMAL HIGH (ref 4.8–5.6)
Mean Plasma Glucose: 119.76 mg/dL

## 2021-10-01 MED ORDER — ATORVASTATIN CALCIUM 40 MG PO TABS: 40.0000 mg | ORAL_TABLET | Freq: Every day | ORAL | 11 refills | Status: DC

## 2021-10-01 MED ORDER — ATORVASTATIN CALCIUM 80 MG PO TABS
80.0000 mg | ORAL_TABLET | Freq: Every day | ORAL | 11 refills | Status: DC
  Filled 2021-10-01: qty 30, 30d supply, fill #0

## 2021-10-01 MED ORDER — ATORVASTATIN CALCIUM 20 MG PO TABS
20.0000 mg | ORAL_TABLET | Freq: Every day | ORAL | 11 refills | Status: DC
  Filled 2021-10-01: qty 30, 30d supply, fill #0

## 2021-10-01 MED ORDER — NITROGLYCERIN 0.4 MG SL SUBL
0.4000 mg | SUBLINGUAL_TABLET | SUBLINGUAL | 12 refills | Status: DC | PRN
Start: 2021-10-01 — End: 2023-10-12
  Filled 2021-10-01: qty 25, 14d supply, fill #0

## 2021-10-01 MED ORDER — ATORVASTATIN CALCIUM 40 MG PO TABS
40.0000 mg | ORAL_TABLET | Freq: Every day | ORAL | 11 refills | Status: DC
  Filled 2021-10-01: qty 30, 30d supply, fill #0

## 2021-10-01 MED ORDER — ASPIRIN 81 MG PO CHEW: 81.0000 mg | CHEWABLE_TABLET | Freq: Every day | ORAL | Status: AC

## 2021-10-01 MED ORDER — TICAGRELOR 90 MG PO TABS
90.0000 mg | ORAL_TABLET | Freq: Two times a day (BID) | ORAL | 11 refills | Status: DC
  Filled 2021-10-01: qty 60, 30d supply, fill #0

## 2021-10-01 MED ORDER — METOPROLOL SUCCINATE ER 25 MG PO TB24
25.0000 mg | ORAL_TABLET | Freq: Every day | ORAL | Status: DC
Start: 2021-10-01 — End: 2021-10-01
  Administered 2021-10-01: 25 mg via ORAL
  Filled 2021-10-01: qty 1

## 2021-10-01 MED ORDER — METOPROLOL SUCCINATE ER 25 MG PO TB24
25.0000 mg | ORAL_TABLET | Freq: Every day | ORAL | 11 refills | Status: DC
  Filled 2021-10-01: qty 30, 30d supply, fill #0

## 2021-10-01 MED ORDER — METOPROLOL SUCCINATE ER 50 MG PO TB24
50.0000 mg | ORAL_TABLET | Freq: Every day | ORAL | 11 refills | Status: DC
  Filled 2021-10-01: qty 30, 30d supply, fill #0

## 2021-10-01 NOTE — Progress Notes (Signed)
Pt had 11 beats run of VTach. Pt complains of chest tightness 3/10. VS and 12 L EKG done. Nitroglycerin 0.4 mg SL given x 1 with relief. Dr. Harrell Gave (on call) was notified. Will continue to monitor pt.   10/01/21 0326  Vitals  BP 118/75  MAP (mmHg) 86  BP Location Left Arm  BP Method Automatic  Patient Position (if appropriate) Lying  Pulse Rate (!) 57  ECG Heart Rate 60  Resp 18  MEWS COLOR  MEWS Score Color Green  Oxygen Therapy  SpO2 97 %  O2 Device Room Air  Pain Assessment  Pain Scale 0-10  Pain Score 3  Pain Type Acute pain  Pain Location Chest  Pain Orientation Mid  Pain Descriptors / Indicators Tightness  Pain Frequency Intermittent  Pain Onset On-going  Patients Stated Pain Goal 0  Pain Intervention(s) Medication (See eMAR);MD notified (Comment)  Multiple Pain Sites No  MEWS Score  MEWS Temp 0  MEWS Systolic 0  MEWS Pulse 0  MEWS RR 0  MEWS LOC 0  MEWS Score 0

## 2021-10-01 NOTE — Progress Notes (Signed)
Seen and examined by Dr. Gardiner Rhyme.

## 2021-10-01 NOTE — Care Management (Signed)
  Transition of Care [facility]) Screening Note   Patient Details  Name: Andre Martinez Date of Birth: 04-Sep-1955   Transition of Care [facility]) CM/SW Contact:    Bethena Roys, RN Phone Number: 10/01/2021, 9:46 AM    Transition of Care Department [facility]) has reviewed patient and no TOC needs have been identified at this time. Case Manager submitted benefits check for [employer]. Medications should be delivered via [place].

## 2021-10-01 NOTE — Discharge Instructions (Signed)
PLEASE REMEMBER TO BRING ALL OF YOUR MEDICATIONS TO EACH OF YOUR FOLLOW-UP OFFICE VISITS. ° °PLEASE ATTEND ALL SCHEDULED FOLLOW-UP APPOINTMENTS.  ° °Activity: Increase activity slowly as tolerated. You may shower, but no soaking baths (or swimming) for 1 week. No driving for 1 week. No lifting over 5 lbs for 2 weeks. No sexual activity for 1 week.  ° °You May Return to Work: in 3 weeks (if applicable) ° °Wound Care: You may wash cath site gently with soap and water. Keep cath site clean and dry. If you notice pain, swelling, bleeding or pus at your cath site, please call [phone]. ° ° ° °Cardiac Cath Site Care °Refer to this sheet in the next few weeks. These instructions provide you with information on caring for yourself after your procedure. Your caregiver may also give you more specific instructions. Your treatment has been planned according to current medical practices, but problems sometimes occur. Call your caregiver if you have any problems or questions after your procedure. °HOME CARE INSTRUCTIONS °· You may shower 24 hours after the procedure. Remove the bandage (dressing) and gently wash the site with plain soap and water. Gently pat the site dry.  °· Do not apply powder or lotion to the site.  °· Do not sit in a bathtub, swimming pool, or whirlpool for 5 to 7 days.  °· No bending, squatting, or lifting anything over 10 pounds (4.5 kg) as directed by your caregiver.  °· Inspect the site at least twice daily.  °· Do not drive home if you are discharged the same day of the procedure. Have someone else drive you.  °· You may drive 24 hours after the procedure unless otherwise instructed by your caregiver.  °What to expect: °· Any bruising will usually fade within 1 to 2 weeks.  °· Blood that collects in the tissue (hematoma) may be painful to the touch. It should usually decrease in size and tenderness within 1 to 2 weeks.  °SEEK IMMEDIATE MEDICAL CARE IF: °· You have unusual pain at the site or down the  affected limb.  °· You have redness, warmth, swelling, or pain at the site.  °· You have drainage (other than a small amount of blood on the dressing).  °· You have chills.  °· You have a fever or persistent symptoms for more than 72 hours.  °· You have a fever and your symptoms suddenly get worse.  °· Your leg becomes pale, cool, tingly, or numb.  °· You have heavy bleeding from the site. Hold pressure on the site.  °Document Released: 06/04/2010 Document Revised: 04/21/2011 Document Reviewed:  ° °

## 2021-10-01 NOTE — Progress Notes (Signed)
  Echocardiogram 2D Echocardiogram has been performed.  Andre Martinez 10/01/2021, 11:58 AM

## 2021-10-01 NOTE — TOC Transition Note (Signed)
Transition of Care [facility]) - CM/SW Discharge Note   Patient Details  Name: Andre Martinez MRN: [id number] Date of Birth: 1956-04-25  Transition of Care [facility]) CM/SW Contact:  Zenon Mayo, RN Phone Number: 10/01/2021, 2:54 PM   Clinical Narrative:    Patient is for dc today, TOC to fill meds. He has no other needs.         Patient Goals and CMS Choice        Discharge Placement                       Discharge Plan and Services                                     Social Determinants of Health (SDOH) Interventions     Readmission Risk Interventions     View : No data to display.

## 2021-10-01 NOTE — Discharge Summary (Addendum)
Discharge Summary    Patient ID: [name] MRN: [id number]; DOB: 12/01/1955  Admit date: 09/30/2021 Discharge date: 10/01/2021  PCP:  Denita Lung, MD   [facility] Providers Cardiologist:  Donato Heinz, MD        Discharge Diagnoses    Principal Problem:   Non-ST elevation (NSTEMI) myocardial infarction [facility]) Active Problems:   Chest pain   Acute coronary syndrome [facility])   Essential hypertension    Diagnostic Studies/Procedures    CARDIAC CATH: 09/30/2021   Mid RCA lesion is 50% stenosed.   Dist RCA lesion is 90% stenosed.  A drug-eluting stent was successfully placed using a SYNERGY XD 2.50X16, postdilated to 3.0 mm.   Post intervention, there is a 0% residual stenosis.   The left ventricular systolic function is normal.   LV end diastolic pressure is normal.   The left ventricular ejection fraction is 55-65% by visual estimate.   There is no aortic valve stenosis.   Continue dual antiplatelet therapy along with aggressive secondary prevention.  He will need high-dose statin, healthy diet and regular exercise.   Results called to the wife.   Diagnostic Dominance: Right Intervention   _ ECHO: pending _   History of Present Illness     Andre Martinez is a 66 y.o. male with no previous cardiac history but with a history of palpitations and premature beats, hypertension, hyperlipidemia, GERD, and arthritis, who was admitted 5/18 with a non-STEMI.  Hospital Course     Consultants: None   1.  Non-STEMI -Cath report above, s/p DES RCA and medical therapy for more proximal RCA lesion of 50%. - Patient had some chest pain overnight, he thinks nitroglycerin helped but is not sure.  He says walking around made it better.  - He realized that his GERD symptoms and his angina feel exactly the same, except for intensity and associated symptoms. The symptoms last pm may have been GERD - Continue GDMT w/ ASA, Brilinta, statin, beta-blocker - his  LDL was 51 on his home dose of Lipitor 40 mg qd >> no change indicated. -Cardiac rehab has seen, he did well with ambulation, no chest pain or SOB -Echo pending   2.  Sinus bradycardia, nonsustained VT -He has palpitations at times, but did not have any overnight. - he had PVCs and pairs as well as a 12 beat run of nonsustained VT, asymptomatic -Overnight, heart rate dropped into the 50s - he had been given his home dose of Toprol XL 50 mg, since he usually takes it at night and has no sx with this, no dose change at this time.   Did the patient have an acute coronary syndrome (MI, NSTEMI, STEMI, etc) this admission?:  Yes                               AHA/ACC Clinical Performance & Quality Measures: Aspirin prescribed? - Yes ADP Receptor Inhibitor (Plavix/Clopidogrel, Brilinta/Ticagrelor or Effient/Prasugrel) prescribed (includes medically managed patients)? - Yes Beta Blocker prescribed? - Yes High Intensity Statin (Lipitor 40-'80mg'$  or Crestor 20-'40mg'$ ) prescribed? - Yes EF assessed during THIS hospitalization? - Yes For EF <40%, was ACEI/ARB prescribed? - Not Applicable (EF >/= 70%) For EF <40%, Aldosterone Antagonist (Spironolactone or Eplerenone) prescribed? - Not Applicable (EF >/= 35%) Cardiac Rehab Phase II ordered (including medically managed patients)? - Yes    _____________  Discharge Vitals Blood pressure 101/67, pulse 62, temperature 98.1 F (  36.7 C), temperature source Oral, resp. rate 15, height '6\' 2"'$  (1.88 m), weight 88.5 kg, SpO2 98 %.  Filed Weights   09/30/21 1306  Weight: 88.5 kg    Labs & Radiologic Studies    CBC Recent Labs    09/30/21 1312 10/01/21 0152  WBC 8.8 12.4*  NEUTROABS 5.1  --   HGB 13.8 13.3  HCT 40.7 38.7*  MCV 90.4 88.4  PLT 279 510   Basic Metabolic Panel Recent Labs    09/30/21 1312 10/01/21 0152  NA 139 139  K 3.8 3.8  CL 107 109  CO2 24 23  GLUCOSE 129* 94  BUN 15 14  CREATININE 1.03 0.84  CALCIUM 8.7* 8.4*   Liver  Function Tests Recent Labs    09/30/21 1312  AST 21  ALT 21  ALKPHOS 79  BILITOT 0.8  PROT 5.7*  ALBUMIN 3.2*   Recent Labs    09/30/21 1312  LIPASE 29   High Sensitivity Troponin:   Recent Labs  Lab 09/30/21 1312 09/30/21 1547  TROPONINIHS 39* 1,432*    BNP Invalid input(s): POCBNP D-Dimer Recent Labs    09/30/21 1312  DDIMER <0.27   Hemoglobin A1C Recent Labs    10/01/21 0152  HGBA1C 5.8*   Fasting Lipid Panel Recent Labs    10/01/21 0152  CHOL 107  HDL 35*  LDLCALC 51  TRIG 104  CHOLHDL 3.1   Thyroid Function Tests No results for input(s): TSH, T4TOTAL, T3FREE, THYROIDAB in the last 72 hours.  Invalid input(s): FREET3 _____________  CARDIAC CATHETERIZATION  Result Date: 09/30/2021   Mid RCA lesion is 50% stenosed.   Dist RCA lesion is 90% stenosed.  A drug-eluting stent was successfully placed using a SYNERGY XD 2.50X16, postdilated to 3.0 mm.   Post intervention, there is a 0% residual stenosis.   The left ventricular systolic function is normal.   LV end diastolic pressure is normal.   The left ventricular ejection fraction is 55-65% by visual estimate.   There is no aortic valve stenosis. Continue dual antiplatelet therapy along with aggressive secondary prevention.  He will need high-dose statin, healthy diet and regular exercise. Results called to the wife.    DG Chest Port 1 View  Result Date: 09/30/2021 CLINICAL DATA:  66 year old male present is for evaluation of chest pain. EXAM: PORTABLE CHEST 1 VIEW COMPARISON:  Esophageal evaluation from 20/12. No recent comparison imaging is available. FINDINGS: EKG leads project over the patient's chest. Trachea midline. Cardiomediastinal contours and hilar structures are normal. Lungs are clear. No visible pneumothorax.  No sign of gross effusion. On limited assessment there is no acute skeletal process. IMPRESSION: No acute cardiopulmonary disease. Electronically Signed   By: Zetta Bills M.D.   On:  09/30/2021 13:48    Disposition   Pt is being discharged home today in good condition.  Follow-up Plans & Appointments     Follow-up Information     Andre Mclean, PA-C Follow up.   Specialties: Physician Assistant, Cardiology Why: Hospital follow-up with Cardiology scheduled for 10/13/2021 at 2:45pm. Please arrive 15 minutes early for check-in. If this date/time does not work for you, pleaes call our office to reschedule. Contact information: [place] [address] [phone]                Discharge Instructions     Amb Referral to Cardiac Rehabilitation   Complete by: As directed    Diagnosis: Coronary Stents  After initial evaluation and assessments completed: Virtual Based Care may be provided alone or in conjunction with Phase 2 Cardiac Rehab based on patient barriers.: Yes   Diet - low sodium heart healthy   Complete by: As directed    Increase activity slowly   Complete by: As directed        Discharge Medications   Allergies as of 10/01/2021       Reactions   Codeine Nausea Only   Penicillins Rash   DID THE REACTION INVOLVE: Swelling of the face/tongue/throat, SOB, or low BP? No Sudden or severe rash/hives, skin peeling, or the inside of the mouth or nose? No Did it require medical treatment? No When did it last happen?   Childhood allergy If all above answers are "NO", may proceed with cephalosporin use.        Medication List     TAKE these medications    aspirin 81 MG chewable tablet Chew 1 tablet (81 mg total) by mouth daily. Start taking on: Oct 02, 2021 Notes to patient: Blood thinner  Prevents clotting in the stent and heart attack   atorvastatin 40 MG tablet Commonly known as: Lipitor Take 1 tablet (40 mg total) by mouth daily. What changed:  medication strength how much to take Notes to patient: Cholesterol    Brilinta 90 MG Tabs tablet Generic drug: ticagrelor Take 1 tablet (90 mg total)  by mouth 2 (two) times daily. Notes to patient: Blood thinner  Prevents clotting in the stent and heart attack   metoprolol succinate 50 MG 24 hr tablet Commonly known as: TOPROL-XL Take 1 tablet (50 mg total) by mouth daily. Take with or immediately following a meal. Notes to patient: Decreases work of the heart  Lowers blood pressure and heart rate Controls heart rhythm    multivitamin tablet Take 1 tablet by mouth daily. Notes to patient: Supplement    nitroGLYCERIN 0.4 MG SL tablet Commonly known as: NITROSTAT Place 1 tablet (0.4 mg total) under the tongue every 5 (five) minutes x 3 doses as needed for chest pain. Notes to patient: DO NOT TAKE within 24 hours of taking Cialis or tadalafil   omeprazole 40 MG capsule Commonly known as: PRILOSEC Take 40 mg by mouth daily. Notes to patient: Acid reflux    tadalafil 20 MG tablet Commonly known as: Cialis Take 1 tablet (20 mg total) by mouth daily as needed for erectile dysfunction. Notes to patient: DO NOT TAKE within 24 hours of taking nitroglycerin           Outstanding Labs/Studies   Echo  Duration of Discharge Encounter   Greater than 30 minutes including physician time.  Signed, Rosaria Ferries, PA-C 10/01/2021, 4:15 PM

## 2021-10-01 NOTE — TOC Benefit Eligibility Note (Signed)
Patient Research scientist (life sciences) completed.     The patient is currently admitted and upon discharge could be taking BRILINTA 90 MG.   The current 30 day co-pay is, $47.   The patient is insured through [employer].

## 2021-10-01 NOTE — Progress Notes (Addendum)
Progress Note  Patient Name: Andre Martinez Date of Encounter: 10/01/2021  [facility] Cardiologist: Donato Heinz, MD new  Subjective   Had some chest pressure, 3/10 at 3 am, got nitro and it got better It felt like his previous heart burn sx, but the MI pain was associated w/ bilat arm pain, clammy feeling Difference from GI sx was severity and associated sx  Pt has occasional palpitations, none yesterday or overnight, no presyncope or syncope.  Inpatient Medications    Scheduled Meds:  aspirin  81 mg Oral Daily   atorvastatin  80 mg Oral Daily   metoprolol succinate  50 mg Oral Daily   pantoprazole  40 mg Oral Daily   sodium chloride flush  3 mL Intravenous Q12H   ticagrelor  90 mg Oral BID   Continuous Infusions:  sodium chloride     PRN Meds: sodium chloride, acetaminophen, nitroGLYCERIN, ondansetron (ZOFRAN) IV, sodium chloride flush   Vital Signs    Vitals:   10/01/21 0326 10/01/21 0331 10/01/21 0410 10/01/21 0759  BP: 118/75 102/68 94/66 101/67  Pulse: (!) 57 64 (!) 53 62  Resp: '18 12 17 15  '$ Temp:   97.9 F (36.6 C) 98.1 F (36.7 C)  TempSrc:   Oral Oral  SpO2: 97% 96% 95% 98%  Weight:      Height:       No intake or output data in the 24 hours ending 10/01/21 0828    09/30/2021    1:06 PM 05/24/2021    1:02 PM 04/15/2021   10:03 AM  Last 3 Weights  Weight (lbs) 195 lb 198 lb 198 lb  Weight (kg) 88.451 kg 89.812 kg 89.812 kg      Telemetry    SR, sinus brady, PVCs, pairs, 12 bt run NSVT - Personally Reviewed  ECG    05/19 SR, HR 71, Q waves only in III - Personally Reviewed  Physical Exam   GEN: No acute distress.   Neck: No JVD Cardiac: RRR, no murmurs, rubs, or gallops.  Respiratory: Clear to auscultation bilaterally. GI: Soft, nontender, non-distended  MS: No edema; No deformity.  Right radial cath site with hematoma extending about halfway up his forearm with some swelling and ecchymosis.  It is not tight and he has a  good distal pulse, fingers are not swollen Neuro:  Nonfocal  Psych: Normal affect   Labs    High Sensitivity Troponin:   Recent Labs  Lab 09/30/21 1312 09/30/21 1547  TROPONINIHS 39* 1,432*     Chemistry Recent Labs  Lab 09/30/21 1312 10/01/21 0152  NA 139 139  K 3.8 3.8  CL 107 109  CO2 24 23  GLUCOSE 129* 94  BUN 15 14  CREATININE 1.03 0.84  CALCIUM 8.7* 8.4*  PROT 5.7*  --   ALBUMIN 3.2*  --   AST 21  --   ALT 21  --   ALKPHOS 79  --   BILITOT 0.8  --   GFRNONAA >60 >60  ANIONGAP 8 7    Lipids  Recent Labs  Lab 10/01/21 0152  CHOL 107  TRIG 104  HDL 35*  LDLCALC 51  CHOLHDL 3.1    Hematology Recent Labs  Lab 09/30/21 1312 10/01/21 0152  WBC 8.8 12.4*  RBC 4.50 4.38  HGB 13.8 13.3  HCT 40.7 38.7*  MCV 90.4 88.4  MCH 30.7 30.4  MCHC 33.9 34.4  RDW 12.8 13.0  PLT 279 253   Thyroid No  results for input(s): TSH, FREET4 in the last 168 hours.  BNPNo results for input(s): BNP, PROBNP in the last 168 hours.  DDimer  Recent Labs  Lab 09/30/21 1312  DDIMER <0.27    Lab Results  Component Value Date   HGBA1C 5.8 (H) 10/01/2021     Radiology    CARDIAC CATHETERIZATION  Result Date: 09/30/2021   Mid RCA lesion is 50% stenosed.   Dist RCA lesion is 90% stenosed.  A drug-eluting stent was successfully placed using a SYNERGY XD 2.50X16, postdilated to 3.0 mm.   Post intervention, there is a 0% residual stenosis.   The left ventricular systolic function is normal.   LV end diastolic pressure is normal.   The left ventricular ejection fraction is 55-65% by visual estimate.   There is no aortic valve stenosis. Continue dual antiplatelet therapy along with aggressive secondary prevention.  He will need high-dose statin, healthy diet and regular exercise. Results called to the wife.    DG Chest Port 1 View  Result Date: 09/30/2021 CLINICAL DATA:  66 year old male present is for evaluation of chest pain. EXAM: PORTABLE CHEST 1 VIEW COMPARISON:   Esophageal evaluation from 20/12. No recent comparison imaging is available. FINDINGS: EKG leads project over the patient's chest. Trachea midline. Cardiomediastinal contours and hilar structures are normal. Lungs are clear. No visible pneumothorax.  No sign of gross effusion. On limited assessment there is no acute skeletal process. IMPRESSION: No acute cardiopulmonary disease. Electronically Signed   By: Zetta Bills M.D.   On: 09/30/2021 13:48    Cardiac Studies   CARDIAC CATH: 09/30/2021   Mid RCA lesion is 50% stenosed.   Dist RCA lesion is 90% stenosed.  A drug-eluting stent was successfully placed using a SYNERGY XD 2.50X16, postdilated to 3.0 mm.   Post intervention, there is a 0% residual stenosis.   The left ventricular systolic function is normal.   LV end diastolic pressure is normal.   The left ventricular ejection fraction is 55-65% by visual estimate.   There is no aortic valve stenosis.   Continue dual antiplatelet therapy along with aggressive secondary prevention.  He will need high-dose statin, healthy diet and regular exercise.   Results called to the wife.   Diagnostic Dominance: Right Intervention    Patient Profile     66 y.o. male with no previous cardiac history but with a history of palpitations and premature beats, hypertension, hyperlipidemia, GERD, and arthritis, was admitted 5/18 with a non-STEMI.  Assessment & Plan    1.  Non-STEMI -Cath report above, s/p DES RCA and medical therapy for more proximal RCA lesion of 50%. - Patient had some chest pain overnight, he thinks nitroglycerin helped but is not sure.  He says walking around made it better. - He realized that his GERD symptoms and his angina feel exactly the same, except for intensity and associated symptoms - Continue ASA, Brilinta, high-dose statin, home dose of beta-blocker -Cardiac rehab to see -Echo pending - Possible DC today if does well with ambulation  2.  Sinus bradycardia,  nonsustained VT -He has palpitations at times, but did not have any overnight. - he had PVCs and pairs as well as a 12 beat run of nonsustained VT -Overnight, heart rate dropped into the 50s -This is his home dose of beta-blocker and he has tolerated it well.  No dose change  For questions or updates, please contact [facility] Please consult [email or web address] for contact info under  Signed, Rosaria Ferries, PA-C  10/01/2021, 8:28 AM     Patient seen and examined.  Agree with above documentation.  On exam, patient is alert and oriented, regular rate and rhythm, no murmurs, lungs CTAB, no LE edema or JVD.  Cath with severe distal RCA stenosis status post DES.  Also with 50% mid RCA stenosis.  Echo shows normal systolic function.  Plan discharge today on aspirin, Brilinta, statin, beta blocker.  Donato Heinz, MD

## 2021-10-01 NOTE — Progress Notes (Signed)
TR BAND REMOVAL  LOCATION:    right radial  DEFLATED PER PROTOCOL:    Yes.    TIME BAND OFF / DRESSING APPLIED:    2203   SITE UPON ARRIVAL:    Level 0  SITE AFTER BAND REMOVAL:    Level 1  CIRCULATION SENSATION AND MOVEMENT:    Within Normal Limits   Yes.    COMMENTS: applied sterile dressing, good capillary refill, instructed pt post cath site care and verbalized understanding.

## 2021-10-01 NOTE — Progress Notes (Signed)
CARDIAC REHAB PHASE I   PRE:  Rate/Rhythm: 37 SB  BP:  Sitting: 12/68      SaO2: 97 RA  MODE:  Ambulation: 470 ft   POST:  Rate/Rhythm: 72 SR  BP:  Sitting: 111/76    SaO2: 98 RA   Pt ambulated 436f in hallway independently with steady gait. Pt denies CP, SOB, or dizziness. Pt educated on importance of ASA and Brilinta. Pt given heart healthy diet. Reviewed site care, restrictions, and exercise guidelines. Will refer to CRP II [email or web address] Falco RN BSN 10/01/2021 9:23 AM

## 2021-10-01 NOTE — Progress Notes (Signed)
Pt had 6 beats run of VTach. Pt is asymptomatic. Will continue to monitor pt.

## 2021-10-02 DIAGNOSIS — I471 Supraventricular tachycardia: Secondary | ICD-10-CM | POA: Diagnosis not present

## 2021-10-02 DIAGNOSIS — R9431 Abnormal electrocardiogram [ECG] [EKG]: Secondary | ICD-10-CM | POA: Diagnosis not present

## 2021-10-02 DIAGNOSIS — I472 Ventricular tachycardia, unspecified: Secondary | ICD-10-CM | POA: Diagnosis not present

## 2021-10-02 LAB — LIPOPROTEIN A (LPA): Lipoprotein (a): 205.5 nmol/L — ABNORMAL HIGH (ref <75.0)

## 2021-10-06 DIAGNOSIS — M1711 Unilateral primary osteoarthritis, right knee: Secondary | ICD-10-CM | POA: Diagnosis not present

## 2021-10-12 ENCOUNTER — Telehealth (HOSPITAL_COMMUNITY): Payer: Self-pay

## 2021-10-12 NOTE — Telephone Encounter (Signed)
Called patient to see if he is interested in the Cardiac Rehab Program. Patient expressed interest. Explained scheduling process, patient verbalized understanding. Will contact patient for scheduling once f/u has been completed.  

## 2021-10-13 ENCOUNTER — Encounter: Payer: Self-pay | Admitting: Student

## 2021-10-13 ENCOUNTER — Ambulatory Visit: Payer: Medicare Other | Admitting: Student

## 2021-10-13 VITALS — BP 126/83 | HR 62 | Ht 74.0 in | Wt 194.0 lb

## 2021-10-13 DIAGNOSIS — E785 Hyperlipidemia, unspecified: Secondary | ICD-10-CM

## 2021-10-13 DIAGNOSIS — I10 Essential (primary) hypertension: Secondary | ICD-10-CM

## 2021-10-13 DIAGNOSIS — I2510 Atherosclerotic heart disease of native coronary artery without angina pectoris: Secondary | ICD-10-CM

## 2021-10-13 DIAGNOSIS — I493 Ventricular premature depolarization: Secondary | ICD-10-CM | POA: Diagnosis not present

## 2021-10-13 DIAGNOSIS — I252 Old myocardial infarction: Secondary | ICD-10-CM

## 2021-10-13 DIAGNOSIS — R001 Bradycardia, unspecified: Secondary | ICD-10-CM

## 2021-10-13 MED ORDER — PANTOPRAZOLE SODIUM 40 MG PO TBEC: 40.0000 mg | DELAYED_RELEASE_TABLET | Freq: Every day | ORAL | 3 refills | Status: DC

## 2021-10-13 MED ORDER — PANTOPRAZOLE SODIUM 40 MG PO TBEC: 40.0000 mg | DELAYED_RELEASE_TABLET | Freq: Every day | ORAL | 0 refills | Status: DC

## 2021-10-13 MED ORDER — TICAGRELOR 90 MG PO TABS
90.0000 mg | ORAL_TABLET | Freq: Two times a day (BID) | ORAL | 11 refills | Status: DC
Start: 2021-10-13 — End: 2021-10-14

## 2021-10-13 MED ORDER — PANTOPRAZOLE SODIUM 40 MG PO TBEC: 40.0000 mg | DELAYED_RELEASE_TABLET | Freq: Every day | ORAL | 11 refills | Status: DC

## 2021-10-13 NOTE — Patient Instructions (Addendum)
Medication Instructions:  Stop Omeprazole. Start Protonix 40 mg ( Take 1 Tablet Daily). *If you need a refill on your cardiac medications before your next appointment, please call your pharmacy*   Lab Work: No Labs If you have labs (blood work) drawn today and your tests are completely normal, you will receive your results only by: [place] (if you have MyChart) OR A paper copy in the mail If you have any lab test that is abnormal or we need to change your treatment, we will call you to review the results.   Testing/Procedures: No Testing   Follow-Up: At [facility], you and your health needs are our priority.  As part of our continuing mission to provide you with exceptional heart care, we have created designated Provider Care Teams.  These Care Teams include your primary Cardiologist (physician) and Advanced Practice Providers (APPs -  Physician Assistants and Nurse Practitioners) who all work together to provide you with the care you need, when you need it.  We recommend signing up for the patient portal called "MyChart".  Sign up information is provided on this After Visit Summary.  MyChart is used to connect with patients for Virtual Visits (Telemedicine).  Patients are able to view lab/test results, encounter notes, upcoming appointments, etc.  Non-urgent messages can be sent to your provider as well.   To learn more about what you can do with MyChart, go to [email or web address].    Your next appointment:   3 month(s)  The format for your next appointment:   In Person  Provider:   Sande Rives, PA-C    or Donato Heinz, MD   1}      Important Information About Sugar

## 2021-10-14 ENCOUNTER — Other Ambulatory Visit: Payer: Self-pay

## 2021-10-14 ENCOUNTER — Telehealth: Payer: Self-pay

## 2021-10-14 MED ORDER — TICAGRELOR 90 MG PO TABS: 90.0000 mg | ORAL_TABLET | Freq: Two times a day (BID) | ORAL | 1 refills | Status: DC

## 2021-10-14 NOTE — Telephone Encounter (Signed)
Called patient to advised called pharmacy and changes Prescription for Brillinta from 30 days to 90 days.

## 2021-10-18 ENCOUNTER — Telehealth (HOSPITAL_COMMUNITY): Payer: Self-pay

## 2021-10-18 NOTE — Telephone Encounter (Signed)
Pt called wanting to schedule for cardiac rehab, I advised pt that he has been cleared to participate and that we have a 1-2 month waitlist and that we will call him back at a later date to get him scheduled.

## 2021-10-21 ENCOUNTER — Telehealth (HOSPITAL_COMMUNITY): Payer: Self-pay

## 2021-10-21 ENCOUNTER — Other Ambulatory Visit (HOSPITAL_COMMUNITY): Payer: Self-pay

## 2021-10-21 NOTE — Telephone Encounter (Signed)
Pharmacy Transitions of Care Follow-up Telephone Call  Date of discharge: 10/01/21  Discharge Diagnosis: NSTEMI  How have you been since you were released from the hospital? Patient doing well since discharge, no questions about meds at this time   Medication changes made at discharge:     START taking: aspirin  nitroGLYCERIN (NITROSTAT)  CHANGE how you take: atorvastatin (Lipitor)   Medication changes verified by the patient? Yes    Medication Accessibility:  Home Pharmacy: [facility]   Was the patient provided with refills on discharged medications? Yes   Have all prescriptions been transferred from [facility] to home pharmacy? Unable to transfer to [facility]   Is the patient able to afford medications? Has insurance    Medication Review:  TICAGRELOR (BRILINTA) Ticagrelor 90 mg BID initiated on 10/01/21.  - Discussed importance of taking medication around the same time every day, - Advised patient of medications to avoid (NSAIDs, aspirin maintenance doses>100 mg daily) - Educated that Tylenol (acetaminophen) will be the preferred analgesic to prevent risk of bleeding  - Emphasized importance of monitoring for signs and symptoms of bleeding (abnormal bruising, prolonged bleeding, nose bleeds, bleeding from gums, discolored urine, black tarry stools)  - Educated patient to notify doctor if shortness of breath or abnormal heartbeat occur - Advised patient to alert all providers of antiplatelet therapy prior to starting a new medication or having a procedure   Follow-up Appointments:  PCP Hospital f/u appt confirmed?  Scheduled to see Dr. Redmond School on 05/02/22 @ 9:30AM.   [facility] f/u appt confirmed?  Seen by Dr. Sarajane Jews on 10/13/21  If their condition worsens, is the pt aware to call PCP or go to the Emergency Dept.? Yes  Final Patient Assessment: Patient has had f/u and has refills at home pharmacy

## 2021-11-04 DIAGNOSIS — L988 Other specified disorders of the skin and subcutaneous tissue: Secondary | ICD-10-CM | POA: Diagnosis not present

## 2021-11-04 DIAGNOSIS — D485 Neoplasm of uncertain behavior of skin: Secondary | ICD-10-CM | POA: Diagnosis not present

## 2021-11-04 HISTORY — PX: SKIN BIOPSY: SHX1

## 2021-11-09 NOTE — Telephone Encounter (Signed)
See result note.  

## 2021-11-11 ENCOUNTER — Telehealth: Payer: Self-pay | Admitting: Cardiology

## 2021-11-11 NOTE — Telephone Encounter (Signed)
Pt c/o BP issue: STAT if pt c/o blurred vision, one-sided weakness or slurred speech  1. What are your last 5 BP readings?  11/11/21: 138/85 11/11/26: 133/78 11/09/21: 126/73 11/08/21: 122/74 11/06/21: 118/74  2. Are you having any other symptoms (ex. Dizziness, headache, blurred vision, passed out)? Headache  3. What is your BP issue?  Pt knew something was off with his BP because of the headache. Wife reports patient does not have any other symptoms

## 2021-11-11 NOTE — Telephone Encounter (Signed)
Wife notified that these numbers are WNL. Asked if headache was relieved by taking tylenol, etc... She states that he has not taken anything for relief. Now that she knows the normal range she will have him take tylenol. She will call back if anything else is needed.

## 2021-11-15 ENCOUNTER — Encounter: Payer: Self-pay | Admitting: Family Medicine

## 2021-11-24 ENCOUNTER — Telehealth: Payer: Self-pay | Admitting: Cardiology

## 2021-11-24 NOTE — Telephone Encounter (Signed)
   [facility] Pre-operative Risk Assessment    Request for surgical clearance:  What type of surgery is being performed?  Dental Cleaning & Xrays   When is this surgery scheduled?  11/24/21--Patient is in the chair now  What type of clearance is required (medical clearance vs. Pharmacy clearance to hold med vs. Both)?  Both   Are there any medications that need to be held prior to surgery and how long? Their office is also requesting our recommendation regarding pre-med  Practice name and name of physician performing surgery?  [facility]  [place]   What is your office phone number? [phone]    7.   What is your office fax number? [phone]  8.   Anesthesia type (None, local, MAC, general) ?  None   Zara Council 11/24/2021, 9:14 AM

## 2021-11-24 NOTE — Telephone Encounter (Signed)
   Primary Cardiologist: Donato Heinz, MD  Chart reviewed as part of pre-operative protocol coverage. Simple dental extractions are considered low risk procedures per guidelines and generally do not require any specific cardiac clearance. It is also generally accepted that for simple extractions and dental cleanings, there is no need to interrupt blood thinner therapy.   SBE prophylaxis is not required for the patient. Information given verbally to Joelene Millin that patient may proceed with dental cleaning and no pre-med is required.   I will route this recommendation to the requesting party via Epic fax function and remove from pre-op pool.  Please call with questions.  Emmaline Life, NP-C    11/24/2021, 9:27 AM [place] [address] Office [phone] Fax [phone]

## 2021-11-27 ENCOUNTER — Other Ambulatory Visit: Payer: Self-pay | Admitting: Family Medicine

## 2021-11-27 DIAGNOSIS — I10 Essential (primary) hypertension: Secondary | ICD-10-CM

## 2021-11-27 DIAGNOSIS — E785 Hyperlipidemia, unspecified: Secondary | ICD-10-CM

## 2021-12-01 ENCOUNTER — Telehealth (HOSPITAL_COMMUNITY): Payer: Self-pay | Admitting: *Deleted

## 2021-12-01 NOTE — Telephone Encounter (Signed)
Attempted to reach patient, vm full, unable to leave.

## 2021-12-06 NOTE — Telephone Encounter (Signed)
Cardiac Rehab cardiac risk profile nursing assessment completed with patient via telephone. Spoke with patient regarding upcoming orientation, 12/07/21, patient optimistic and states that he is "looking forward to getting started." No further questions or concerns at this time.  Albertine Grates RN

## 2021-12-07 ENCOUNTER — Encounter (HOSPITAL_COMMUNITY): Payer: Self-pay

## 2021-12-07 ENCOUNTER — Encounter (HOSPITAL_COMMUNITY)
Admission: RE | Admit: 2021-12-07 | Discharge: 2021-12-07 | Disposition: A | Discharge status: Home / Self Care | Payer: Medicare Other | Source: Ambulatory Visit | Attending: Cardiology | Admitting: Cardiology

## 2021-12-07 VITALS — BP 124/72 | HR 69 | Ht 73.75 in | Wt 186.7 lb

## 2021-12-07 DIAGNOSIS — Z48812 Encounter for surgical aftercare following surgery on the circulatory system: Secondary | ICD-10-CM | POA: Diagnosis not present

## 2021-12-07 DIAGNOSIS — I214 Non-ST elevation (NSTEMI) myocardial infarction: Secondary | ICD-10-CM | POA: Insufficient documentation

## 2021-12-07 DIAGNOSIS — Z955 Presence of coronary angioplasty implant and graft: Secondary | ICD-10-CM | POA: Diagnosis not present

## 2021-12-07 HISTORY — DX: Atherosclerotic heart disease of native coronary artery without angina pectoris: I25.10

## 2021-12-07 HISTORY — DX: Hyperlipidemia, unspecified: E78.5

## 2021-12-07 NOTE — Progress Notes (Signed)
Cardiac Individual Treatment Plan  Patient Details  Name: Andre Martinez MRN: [id number] Date of Birth: 1955-05-19 Referring Provider:   Flowsheet Row INTENSIVE CARDIAC REHAB ORIENT from 12/07/2021 in [employer]  Referring Provider Donato Heinz, MD       Initial Encounter Date:  [place] from 12/07/2021 in [facility]  Date 12/07/21       Visit Diagnosis: 09/30/21 NSTEMI  09/30/21 DES RCA  Patient's Home Medications on Admission:  Current Outpatient Medications:    aspirin 81 MG chewable tablet, Chew 1 tablet (81 mg total) by mouth daily., Disp: , Rfl:    atorvastatin (LIPITOR) 40 MG tablet, Take 1 tablet (40 mg total) by mouth daily. (Patient taking differently: Take 40 mg by mouth every evening.), Disp: 30 tablet, Rfl: 11   diclofenac Sodium (VOLTAREN) 1 % GEL, Apply 1 Application topically 4 (four) times daily as needed (arthritis pain (knee))., Disp: , Rfl:    metoprolol succinate (TOPROL-XL) 50 MG 24 hr tablet, Take 1 tablet (50 mg total) by mouth daily. Take with or immediately following a meal. (Patient taking differently: Take 50 mg by mouth every evening. Take with or immediately following a meal.), Disp: 30 tablet, Rfl: 11   Multiple Vitamin (MULTIVITAMIN) tablet, Take 1 tablet by mouth in the morning. Centrum Silver, Disp: , Rfl:    nitroGLYCERIN (NITROSTAT) 0.4 MG SL tablet, Place 1 tablet (0.4 mg total) under the tongue every 5 (five) minutes x 3 doses as needed for chest pain., Disp: 25 tablet, Rfl: 12   pantoprazole (PROTONIX) 40 MG tablet, Take 1 tablet (40 mg total) by mouth daily., Disp: 90 tablet, Rfl: 3   Polyethylene Glycol 400 (BLINK TEARS) 0.25 % SOLN, Place 1-2 drops into both eyes 3 (three) times daily as needed (dry/irritated eyes.)., Disp: , Rfl:    ticagrelor (BRILINTA) 90 MG TABS tablet, Take 1 tablet (90 mg total) by mouth 2 (two) times daily.,  Disp: 180 tablet, Rfl: 1  Past Medical History: Past Medical History:  Diagnosis Date   Allergy    Arthritis    Coronary artery disease    GERD (gastroesophageal reflux disease)    Hemorrhoids    Hyperlipidemia    Hypertension    Sinusitis 12/94 3/95    Tobacco Use: Social History   Tobacco Use  Smoking Status Former   Types: Cigars  Smokeless Tobacco Never    Labs: Review Flowsheet  More data exists      Latest Ref Rng & Units 03/06/2018 04/10/2019 04/13/2020 04/15/2021 10/01/2021  Labs for ITP Cardiac and Pulmonary Rehab  Cholestrol 0 - 200 mg/dL 203  191  198  207  107   LDL (calc) 0 - 99 mg/dL 109  116  128  134  51   HDL-C >40 mg/dL 55  60  53  53  35   Trlycerides <150 mg/dL 193  84  97  113  104   Hemoglobin A1c 4.8 - 5.6 % - - - - 5.8     Capillary Blood Glucose: No results found for: "GLUCAP"   Exercise Target Goals: Exercise Program Goal: Individual exercise prescription set using results from initial 6 min walk test and THRR while considering  patient's activity barriers and safety.   Exercise Prescription Goal: Initial exercise prescription builds to 30-45 minutes a day of aerobic activity, 2-3 days per week.  Home exercise guidelines will be given to patient during  program as part of exercise prescription that the participant will acknowledge.  Activity Barriers & Risk Stratification:  Activity Barriers & Cardiac Risk Stratification - 12/07/21 0947       Activity Barriers & Cardiac Risk Stratification   Activity Barriers Arthritis;Left Knee Replacement;Other (comment)    Comments Needs right knee replacement, wears brace on right knee, gel and hydrocortisone injections.    Cardiac Risk Stratification Moderate             6 Minute Walk:  6 Minute Walk     Row Name 12/07/21 0935         6 Minute Walk   Phase Initial     Distance 1917 feet     Walk Time 6 minutes     # of Rest Breaks 0     MPH 3.63     METS 4.5     RPE 12      Perceived Dyspnea  0     VO2 Peak 15.76     Symptoms No     Resting HR 69 bpm     Resting BP 124/72     Resting Oxygen Saturation  99 %     Exercise Oxygen Saturation  during 6 min walk 100 %     Max Ex. HR 96 bpm     Max Ex. BP 140/80     2 Minute Post BP 112/64              Oxygen Initial Assessment:   Oxygen Re-Evaluation:   Oxygen Discharge (Final Oxygen Re-Evaluation):   Initial Exercise Prescription:  Initial Exercise Prescription - 12/07/21 1000       Date of Initial Exercise RX and Referring Provider   Date 12/07/21    Referring Provider Donato Heinz, MD    Expected Discharge Date 02/11/22      Bike   Level 1.6    Minutes 15    METs 4.56      NuStep   Level 4    SPM 85    Minutes 15    METs 3.5      Prescription Details   Frequency (times per week) 3    Duration Progress to 30 minutes of continuous aerobic without signs/symptoms of physical distress      Intensity   THRR 40-80% of Max Heartrate 61-122    Ratings of Perceived Exertion 11-13    Perceived Dyspnea 0-4      Progression   Progression Continue to progress workloads to maintain intensity without signs/symptoms of physical distress.      Resistance Training   Training Prescription Yes    Weight 5 lbs    Reps 10-15             Perform Capillary Blood Glucose checks as needed.  Exercise Prescription Changes:   Exercise Comments:   Exercise Goals and Review:   Exercise Goals     Row Name 12/07/21 0949             Exercise Goals   Increase Physical Activity Yes       Intervention Provide advice, education, support and counseling about physical activity/exercise needs.;Develop an individualized exercise prescription for aerobic and resistive training based on initial evaluation findings, risk stratification, comorbidities and participant's personal goals.       Expected Outcomes Short Term: Attend rehab on a regular basis to increase amount of physical  activity.;Long Term: Exercising regularly at least 3-5 days a week.;Long Term: Add in  home exercise to make exercise part of routine and to increase amount of physical activity.       Increase Strength and Stamina Yes       Intervention Provide advice, education, support and counseling about physical activity/exercise needs.;Develop an individualized exercise prescription for aerobic and resistive training based on initial evaluation findings, risk stratification, comorbidities and participant's personal goals.       Expected Outcomes Short Term: Increase workloads from initial exercise prescription for resistance, speed, and METs.;Short Term: Perform resistance training exercises routinely during rehab and add in resistance training at home;Long Term: Improve cardiorespiratory fitness, muscular endurance and strength as measured by increased METs and functional capacity (6MWT)       Able to understand and use rate of perceived exertion (RPE) scale Yes       Intervention Provide education and explanation on how to use RPE scale       Expected Outcomes Short Term: Able to use RPE daily in rehab to express subjective intensity level;Long Term:  Able to use RPE to guide intensity level when exercising independently       Knowledge and understanding of Target Heart Rate Range (THRR) Yes       Intervention Provide education and explanation of THRR including how the numbers were predicted and where they are located for reference       Expected Outcomes Short Term: Able to state/look up THRR;Long Term: Able to use THRR to govern intensity when exercising independently;Short Term: Able to use daily as guideline for intensity in rehab       Able to check pulse independently Yes       Intervention Provide education and demonstration on how to check pulse in carotid and radial arteries.;Review the importance of being able to check your own pulse for safety during independent exercise       Expected Outcomes Short  Term: Able to explain why pulse checking is important during independent exercise;Long Term: Able to check pulse independently and accurately       Understanding of Exercise Prescription Yes       Intervention Provide education, explanation, and written materials on patient's individual exercise prescription       Expected Outcomes Short Term: Able to explain program exercise prescription;Long Term: Able to explain home exercise prescription to exercise independently                Exercise Goals Re-Evaluation :   Discharge Exercise Prescription (Final Exercise Prescription Changes):   Nutrition:  Target Goals: Understanding of nutrition guidelines, daily intake of sodium '1500mg'$ , cholesterol '200mg'$ , calories 30% from fat and 7% or less from saturated fats, daily to have 5 or more servings of fruits and vegetables.  Biometrics:  Pre Biometrics - 12/07/21 0912       Pre Biometrics   Waist Circumference 35.5 inches    Hip Circumference 39 inches    Waist to Hip Ratio 0.91 %    Triceps Skinfold 6 mm    % Body Fat 20 %    Grip Strength 47 kg    Flexibility 12.25 in    Single Leg Stand 30 seconds              Nutrition Therapy Plan and Nutrition Goals:   Nutrition Assessments:  MEDIFICTS Score Key: ?70 Need to make dietary changes  40-70 Heart Healthy Diet ? 40 Therapeutic Level Cholesterol Diet    Picture Your Plate Scores: <95 Unhealthy dietary pattern with much room for improvement.  41-50 Dietary pattern unlikely to meet recommendations for good health and room for improvement. 51-60 More healthful dietary pattern, with some room for improvement.  >60 Healthy dietary pattern, although there may be some specific behaviors that could be improved.    Nutrition Goals Re-Evaluation:   Nutrition Goals Re-Evaluation:   Nutrition Goals Discharge (Final Nutrition Goals Re-Evaluation):   Psychosocial: Target Goals: Acknowledge presence or absence of  significant depression and/or stress, maximize coping skills, provide positive support system. Participant is able to verbalize types and ability to use techniques and skills needed for reducing stress and depression.  Initial Review & Psychosocial Screening:  Initial Psych Review & Screening - 12/07/21 1031       Initial Review   Current issues with None Identified      Family Dynamics   Good Support System? Yes   Linna Hoff has his wife and children for support     Barriers   Psychosocial barriers to participate in program There are no identifiable barriers or psychosocial needs.      Screening Interventions   Interventions Encouraged to exercise             Quality of Life Scores:  Quality of Life - 12/07/21 1043       Quality of Life   Select Quality of Life      Quality of Life Scores   Health/Function Pre 21.6 %    Socioeconomic Pre 26.14 %    Psych/Spiritual Pre 26.14 %    Family Pre 27.6 %    GLOBAL Pre 24.35 %            Scores of 19 and below usually indicate a poorer quality of life in these areas.  A difference of  2-3 points is a clinically meaningful difference.  A difference of 2-3 points in the total score of the Quality of Life Index has been associated with significant improvement in overall quality of life, self-image, physical symptoms, and general health in studies assessing change in quality of life.  PHQ-9: Review Flowsheet  More data exists      12/07/2021 04/15/2021 04/13/2020 04/10/2019 03/06/2018  Depression screen PHQ 2/9  Decreased Interest 0 0 0 0 0  Down, Depressed, Hopeless 0 0 0 0 0  PHQ - 2 Score 0 0 0 0 0   Interpretation of Total Score  Total Score Depression Severity:  1-4 = Minimal depression, 5-9 = Mild depression, 10-14 = Moderate depression, 15-19 = Moderately severe depression, 20-27 = Severe depression   Psychosocial Evaluation and Intervention:   Psychosocial Re-Evaluation:   Psychosocial Discharge (Final Psychosocial  Re-Evaluation):   Vocational Rehabilitation: Provide vocational rehab assistance to qualifying candidates.   Vocational Rehab Evaluation & Intervention:  Vocational Rehab - 12/07/21 1008       Initial Vocational Rehab Evaluation & Intervention   Assessment shows need for Vocational Rehabilitation No   Linna Hoff is retired and does not need vocational rehab at this time.            Education: Education Goals: Education classes will be provided on a weekly basis, covering required topics. Participant will state understanding/return demonstration of topics presented.     Core Videos: Exercise    Move It!  Clinical staff conducted group or individual video education with verbal and written material and guidebook.  Patient learns the recommended Pritikin exercise program. Exercise with the goal of living a long, healthy life. Some of the health benefits of exercise include controlled diabetes, healthier blood  pressure levels, improved cholesterol levels, improved heart and lung capacity, improved sleep, and better body composition. Everyone should speak with their doctor before starting or changing an exercise routine.  Biomechanical Limitations Clinical staff conducted group or individual video education with verbal and written material and guidebook.  Patient learns how biomechanical limitations can impact exercise and how we can mitigate and possibly overcome limitations to have an impactful and balanced exercise routine.  Body Composition Clinical staff conducted group or individual video education with verbal and written material and guidebook.  Patient learns that body composition (ratio of muscle mass to fat mass) is a key component to assessing overall fitness, rather than body weight alone. Increased fat mass, especially visceral belly fat, can put [place] at increased risk for metabolic syndrome, type 2 diabetes, heart disease, and even death. It is recommended to combine diet and  exercise (cardiovascular and resistance training) to improve your body composition. Seek guidance from your physician and exercise physiologist before implementing an exercise routine.  Exercise Action Plan Clinical staff conducted group or individual video education with verbal and written material and guidebook.  Patient learns the recommended strategies to achieve and enjoy long-term exercise adherence, including variety, self-motivation, self-efficacy, and positive decision making. Benefits of exercise include fitness, good health, weight management, more energy, better sleep, less stress, and overall well-being.  Medical   Heart Disease Risk Reduction Clinical staff conducted group or individual video education with verbal and written material and guidebook.  Patient learns our heart is our most vital organ as it circulates oxygen, nutrients, white blood cells, and hormones throughout the entire body, and carries waste away. Data supports a plant-based eating plan like the Pritikin Program for its effectiveness in slowing progression of and reversing heart disease. The video provides a number of recommendations to address heart disease.   Metabolic Syndrome and Belly Fat  Clinical staff conducted group or individual video education with verbal and written material and guidebook.  Patient learns what metabolic syndrome is, how it leads to heart disease, and how one can reverse it and keep it from coming back. You have metabolic syndrome if you have 3 of the following 5 criteria: abdominal obesity, high blood pressure, high triglycerides, low HDL cholesterol, and high blood sugar.  Hypertension and Heart Disease Clinical staff conducted group or individual video education with verbal and written material and guidebook.  Patient learns that high blood pressure, or hypertension, is very common in the [place]. Hypertension is largely due to excessive salt intake, but other important risk  factors include being overweight, physical inactivity, drinking too much alcohol, smoking, and not eating enough potassium from fruits and vegetables. High blood pressure is a leading risk factor for heart attack, stroke, congestive heart failure, dementia, kidney failure, and premature death. Long-term effects of excessive salt intake include stiffening of the arteries and thickening of heart muscle and organ damage. Recommendations include ways to reduce hypertension and the risk of heart disease.  Diseases of Our Time - Focusing on Diabetes Clinical staff conducted group or individual video education with verbal and written material and guidebook.  Patient learns why the best way to stop diseases of our time is prevention, through food and other lifestyle changes. Medicine (such as prescription pills and surgeries) is often only a Band-Aid on the problem, not a long-term solution. Most common diseases of our time include obesity, type 2 diabetes, hypertension, heart disease, and cancer. The Pritikin Program is recommended and has been proven to help  reduce, reverse, and/or prevent the damaging effects of metabolic syndrome.  Nutrition   Overview of the Pritikin Eating Plan  Clinical staff conducted group or individual video education with verbal and written material and guidebook.  Patient learns about the [place] for disease risk reduction. The [place] emphasizes a wide variety of unrefined, minimally-processed carbohydrates, like fruits, vegetables, whole grains, and legumes. Go, Caution, and Stop food choices are explained. Plant-based and lean animal proteins are emphasized. Rationale provided for low sodium intake for blood pressure control, low added sugars for blood sugar stabilization, and low added fats and oils for coronary artery disease risk reduction and weight management.  Calorie Density  Clinical staff conducted group or individual video education with verbal  and written material and guidebook.  Patient learns about calorie density and how it impacts the Pritikin Eating Plan. Knowing the characteristics of the food you choose will help you decide whether those foods will lead to weight gain or weight loss, and whether you want to consume more or less of them. Weight loss is usually a side effect of the Pritikin Eating Plan because of its focus on low calorie-dense foods.  Label Reading  Clinical staff conducted group or individual video education with verbal and written material and guidebook.  Patient learns about the Pritikin recommended label reading guidelines and corresponding recommendations regarding calorie density, added sugars, sodium content, and whole grains.  Dining Out - Part 1  Clinical staff conducted group or individual video education with verbal and written material and guidebook.  Patient learns that restaurant meals can be sabotaging because they can be so high in calories, fat, sodium, and/or sugar. Patient learns recommended strategies on how to positively address this and avoid unhealthy pitfalls.  Facts on Fats  Clinical staff conducted group or individual video education with verbal and written material and guidebook.  Patient learns that lifestyle modifications can be just as effective, if not more so, as many medications for lowering your risk of heart disease. A Pritikin lifestyle can help to reduce your risk of inflammation and atherosclerosis (cholesterol build-up, or plaque, in the artery walls). Lifestyle interventions such as dietary choices and physical activity address the cause of atherosclerosis. A review of the types of fats and their impact on blood cholesterol levels, along with dietary recommendations to reduce fat intake is also included.  Nutrition Action Plan  Clinical staff conducted group or individual video education with verbal and written material and guidebook.  Patient learns how to incorporate Pritikin  recommendations into their lifestyle. Recommendations include planning and keeping personal health goals in mind as an important part of their success.  Healthy Mind-Set    Healthy Minds, Bodies, Hearts  Clinical staff conducted group or individual video education with verbal and written material and guidebook.  Patient learns how to identify when they are stressed. Video will discuss the impact of that stress, as well as the many benefits of stress management. Patient will also be introduced to stress management techniques. The way we think, act, and feel has an impact on our hearts.  How Our Thoughts Can Heal Our Hearts  Clinical staff conducted group or individual video education with verbal and written material and guidebook.  Patient learns that negative thoughts can cause depression and anxiety. This can result in negative lifestyle behavior and serious health problems. Cognitive behavioral therapy is an effective method to help control our thoughts in order to change and improve our emotional outlook.  Additional Videos:  Exercise    Improving Performance  Clinical staff conducted group or individual video education with verbal and written material and guidebook.  Patient learns to use a non-linear approach by alternating intensity levels and lengths of time spent exercising to help burn more calories and lose more body fat. Cardiovascular exercise helps improve heart health, metabolism, hormonal balance, blood sugar control, and recovery from fatigue. Resistance training improves strength, endurance, balance, coordination, reaction time, metabolism, and muscle mass. Flexibility exercise improves circulation, posture, and balance. Seek guidance from your physician and exercise physiologist before implementing an exercise routine and learn your capabilities and proper form for all exercise.  Introduction to Yoga  Clinical staff conducted group or individual video education with verbal and  written material and guidebook.  Patient learns about yoga, a discipline of the coming together of mind, breath, and body. The benefits of yoga include improved flexibility, improved range of motion, better posture and core strength, increased lung function, weight loss, and positive self-image. Yoga's heart health benefits include lowered blood pressure, healthier heart rate, decreased cholesterol and triglyceride levels, improved immune function, and reduced stress. Seek guidance from your physician and exercise physiologist before implementing an exercise routine and learn your capabilities and proper form for all exercise.  Medical   Aging: Enhancing Your Quality of Life  Clinical staff conducted group or individual video education with verbal and written material and guidebook.  Patient learns key strategies and recommendations to stay in good physical health and enhance quality of life, such as prevention strategies, having an advocate, securing a [place], and keeping a list of medications and system for tracking them. It also discusses how to avoid risk for bone loss.  Biology of Weight Control  Clinical staff conducted group or individual video education with verbal and written material and guidebook.  Patient learns that weight gain occurs because we consume more calories than we burn (eating more, moving less). Even if your body weight is normal, you may have higher ratios of fat compared to muscle mass. Too much body fat puts you at increased risk for cardiovascular disease, heart attack, stroke, type 2 diabetes, and obesity-related cancers. In addition to exercise, following the [place] can help reduce your risk.  Decoding Lab Results  Clinical staff conducted group or individual video education with verbal and written material and guidebook.  Patient learns that lab test reflects one measurement whose values change over time and are influenced  by many factors, including medication, stress, sleep, exercise, food, hydration, pre-existing medical conditions, and more. It is recommended to use the knowledge from this video to become more involved with your lab results and evaluate your numbers to speak with your doctor.   Diseases of Our Time - Overview  Clinical staff conducted group or individual video education with verbal and written material and guidebook.  Patient learns that according to the CDC, 50% to 70% of chronic diseases (such as obesity, type 2 diabetes, elevated lipids, hypertension, and heart disease) are avoidable through lifestyle improvements including healthier food choices, listening to satiety cues, and increased physical activity.  Sleep Disorders Clinical staff conducted group or individual video education with verbal and written material and guidebook.  Patient learns how good quality and duration of sleep are important to overall health and well-being. Patient also learns about sleep disorders and how they impact health along with recommendations to address them, including discussing with a physician.  Nutrition  Dining Out - Part  2 Clinical staff conducted group or individual video education with verbal and written material and guidebook.  Patient learns how to plan ahead and communicate in order to maximize their dining experience in a healthy and nutritious manner. Included are recommended food choices based on the type of restaurant the patient is visiting.   Fueling a Best boy conducted group or individual video education with verbal and written material and guidebook.  There is a strong connection between our food choices and our health. Diseases like obesity and type 2 diabetes are very prevalent and are in large-part due to lifestyle choices. The Pritikin Eating Plan provides plenty of food and hunger-curbing satisfaction. It is easy to follow, affordable, and helps reduce health  risks.  Menu Workshop  Clinical staff conducted group or individual video education with verbal and written material and guidebook.  Patient learns that restaurant meals can sabotage health goals because they are often packed with calories, fat, sodium, and sugar. Recommendations include strategies to plan ahead and to communicate with the manager, chef, or server to help order a healthier meal.  Planning Your Eating Strategy  Clinical staff conducted group or individual video education with verbal and written material and guidebook.  Patient learns about the [place] and its benefit of reducing the risk of disease. The [place] does not focus on calories. Instead, it emphasizes high-quality, nutrient-rich foods. By knowing the characteristics of the foods, we choose, we can determine their calorie density and make informed decisions.  Targeting Your Nutrition Priorities  Clinical staff conducted group or individual video education with verbal and written material and guidebook.  Patient learns that lifestyle habits have a tremendous impact on disease risk and progression. This video provides eating and physical activity recommendations based on your personal health goals, such as reducing LDL cholesterol, losing weight, preventing or controlling type 2 diabetes, and reducing high blood pressure.  Vitamins and Minerals  Clinical staff conducted group or individual video education with verbal and written material and guidebook.  Patient learns different ways to obtain key vitamins and minerals, including through a recommended healthy diet. It is important to discuss all supplements you take with your doctor.   Healthy Mind-Set    Smoking Cessation  Clinical staff conducted group or individual video education with verbal and written material and guidebook.  Patient learns that cigarette smoking and tobacco addiction pose a serious health risk which affects millions of  people. Stopping smoking will significantly reduce the risk of heart disease, lung disease, and many forms of cancer. Recommended strategies for quitting are covered, including working with your doctor to develop a successful plan.  Culinary   Becoming a Financial trader conducted group or individual video education with verbal and written material and guidebook.  Patient learns that cooking at home can be healthy, cost-effective, quick, and puts them in control. Keys to cooking healthy recipes will include looking at your recipe, assessing your equipment needs, planning ahead, making it simple, choosing cost-effective seasonal ingredients, and limiting the use of added fats, salts, and sugars.  Cooking - Breakfast and Snacks  Clinical staff conducted group or individual video education with verbal and written material and guidebook.  Patient learns how important breakfast is to satiety and nutrition through the entire day. Recommendations include key foods to eat during breakfast to help stabilize blood sugar levels and to prevent overeating at meals later in the day. Planning ahead is also a key component.  Cooking - Human resources officer conducted group or individual video education with verbal and written material and guidebook.  Patient learns eating strategies to improve overall health, including an approach to cook more at home. Recommendations include thinking of animal protein as a side on your plate rather than center stage and focusing instead on lower calorie dense options like vegetables, fruits, whole grains, and plant-based proteins, such as beans. Making sauces in large quantities to freeze for later and leaving the skin on your vegetables are also recommended to maximize your experience.  Cooking - Healthy Salads and Dressing Clinical staff conducted group or individual video education with verbal and written material and guidebook.  Patient learns that  vegetables, fruits, whole grains, and legumes are the foundations of the [place]. Recommendations include how to incorporate each of these in flavorful and healthy salads, and how to create homemade salad dressings. Proper handling of ingredients is also covered. Cooking - Soups and [employer] - Soups and Desserts Clinical staff conducted group or individual video education with verbal and written material and guidebook.  Patient learns that Pritikin soups and desserts make for easy, nutritious, and delicious snacks and meal components that are low in sodium, fat, sugar, and calorie density, while high in vitamins, minerals, and filling fiber. Recommendations include simple and healthy ideas for soups and desserts.   Overview     The Pritikin Solution Program Overview Clinical staff conducted group or individual video education with verbal and written material and guidebook.  Patient learns that the results of the [place] Program have been documented in more than 100 articles published in peer-reviewed journals, and the benefits include reducing risk factors for (and, in some cases, even reversing) high cholesterol, high blood pressure, type 2 diabetes, obesity, and more! An overview of the three key pillars of the Pritikin Program will be covered: eating well, doing regular exercise, and having a healthy mind-set.  WORKSHOPS  Exercise: Exercise Basics: Building Your Action Plan Clinical staff led group instruction and group discussion with PowerPoint presentation and patient guidebook. To enhance the learning environment the use of posters, models and videos may be added. At the conclusion of this workshop, patients will comprehend the difference between physical activity and exercise, as well as the benefits of incorporating both, into their routine. Patients will understand the FITT (Frequency, Intensity, Time, and Type) principle and how to use it to build an exercise action  plan. In addition, safety concerns and other considerations for exercise and cardiac rehab will be addressed by the presenter. The purpose of this lesson is to promote a comprehensive and effective weekly exercise routine in order to improve patients' overall level of fitness.   Managing Heart Disease: Your Path to a Healthier Heart Clinical staff led group instruction and group discussion with PowerPoint presentation and patient guidebook. To enhance the learning environment the use of posters, models and videos may be added.At the conclusion of this workshop, patients will understand the anatomy and physiology of the heart. Additionally, they will understand how Pritikin's three pillars impact the risk factors, the progression, and the management of heart disease.  The purpose of this lesson is to provide a high-level overview of the heart, heart disease, and how the Pritikin lifestyle positively impacts risk factors.  Exercise Biomechanics Clinical staff led group instruction and group discussion with PowerPoint presentation and patient guidebook. To enhance the learning environment the use of posters, models and videos may be added. Patients will  learn how the structural parts of their bodies function and how these functions impact their daily activities, movement, and exercise. Patients will learn how to promote a neutral spine, learn how to manage pain, and identify ways to improve their physical movement in order to promote healthy living. The purpose of this lesson is to expose patients to common physical limitations that impact physical activity. Participants will learn practical ways to adapt and manage aches and pains, and to minimize their effect on regular exercise. Patients will learn how to maintain good posture while sitting, walking, and lifting.  Balance Training and Fall Prevention  Clinical staff led group instruction and group discussion with PowerPoint presentation and  patient guidebook. To enhance the learning environment the use of posters, models and videos may be added. At the conclusion of this workshop, patients will understand the importance of their sensorimotor skills (vision, proprioception, and the vestibular system) in maintaining their ability to balance as they age. Patients will apply a variety of balancing exercises that are appropriate for their current level of function. Patients will understand the common causes for poor balance, possible solutions to these problems, and ways to modify their physical environment in order to minimize their fall risk. The purpose of this lesson is to teach patients about the importance of maintaining balance as they age and ways to minimize their risk of falling.  WORKSHOPS   Nutrition:  Fueling a Scientist, research (physical sciences) led group instruction and group discussion with PowerPoint presentation and patient guidebook. To enhance the learning environment the use of posters, models and videos may be added. Patients will review the foundational principles of the [place] and understand what constitutes a serving size in each of the food groups. Patients will also learn Pritikin-friendly foods that are better choices when away from home and review make-ahead meal and snack options. Calorie density will be reviewed and applied to three nutrition priorities: weight maintenance, weight loss, and weight gain. The purpose of this lesson is to reinforce (in a group setting) the key concepts around what patients are recommended to eat and how to apply these guidelines when away from home by planning and selecting Pritikin-friendly options. Patients will understand how calorie density may be adjusted for different weight management goals.  Mindful Eating  Clinical staff led group instruction and group discussion with PowerPoint presentation and patient guidebook. To enhance the learning environment the use of posters,  models and videos may be added. Patients will briefly review the concepts of the [place] and the importance of low-calorie dense foods. The concept of mindful eating will be introduced as well as the importance of paying attention to internal hunger signals. Triggers for non-hunger eating and techniques for dealing with triggers will be explored. The purpose of this lesson is to provide patients with the opportunity to review the basic principles of the [place], discuss the value of eating mindfully and how to measure internal cues of hunger and fullness using the Hunger Scale. Patients will also discuss reasons for non-hunger eating and learn strategies to use for controlling emotional eating.  Targeting Your Nutrition Priorities Clinical staff led group instruction and group discussion with PowerPoint presentation and patient guidebook. To enhance the learning environment the use of posters, models and videos may be added. Patients will learn how to determine their genetic susceptibility to disease by reviewing their family history. Patients will gain insight into the importance of diet as part of an overall healthy lifestyle  in mitigating the impact of genetics and other environmental insults. The purpose of this lesson is to provide patients with the opportunity to assess their personal nutrition priorities by looking at their family history, their own health history and current risk factors. Patients will also be able to discuss ways of prioritizing and modifying the [place] for their highest risk areas  Menu  Clinical staff led group instruction and group discussion with PowerPoint presentation and patient guidebook. To enhance the learning environment the use of posters, models and videos may be added. Using menus brought in from [employer], or printed from [employer], patients will apply the [place] dining out guidelines that were presented in the  [employer] video. Patients will also be able to practice these guidelines in a variety of provided scenarios. The purpose of this lesson is to provide patients with the opportunity to practice hands-on learning of the [place] with actual menus and practice scenarios.  Label Reading Clinical staff led group instruction and group discussion with PowerPoint presentation and patient guidebook. To enhance the learning environment the use of posters, models and videos may be added. Patients will review and discuss the Pritikin label reading guidelines presented in Pritikin's Label Reading Educational series video. Using fool labels brought in from local grocery stores and markets, patients will apply the label reading guidelines and determine if the packaged food meet the Pritikin guidelines. The purpose of this lesson is to provide patients with the opportunity to review, discuss, and practice hands-on learning of the Pritikin Label Reading guidelines with actual packaged food labels. [place] Workshops are designed to teach patients ways to prepare quick, simple, and affordable recipes at home. The importance of nutrition's role in chronic disease risk reduction is reflected in its emphasis in the overall Pritikin program. By learning how to prepare essential core Pritikin Eating Plan recipes, patients will increase control over what they eat; be able to customize the flavor of foods without the use of added salt, sugar, or fat; and improve the quality of the food they consume. By learning a set of core recipes which are easily assembled, quickly prepared, and affordable, patients are more likely to prepare more healthy foods at home. These workshops focus on convenient breakfasts, simple entres, side dishes, and desserts which can be prepared with minimal effort and are consistent with nutrition recommendations for cardiovascular risk  reduction. 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 The chef or RD has a clear understanding of the importance of minimizing - if not completely eliminating - added fat, sugar, and sodium in recipes. Throughout the series of [place] Workshop sessions, patients will learn about healthy ingredients and efficient methods of cooking to build confidence in their capability to prepare    Cooking School weekly topics:  Adding Flavor- Sodium-Free  Fast and Healthy Breakfasts  Powerhouse Plant-Based Proteins  Satisfying Salads and Dressings  Simple Sides and Sauces  International Cuisine-Spotlight on the [place] Zones  Delicious Desserts  Savory Soups  Efficiency Cooking - Meals in a Snap  Tasty Appetizers and Snacks  Comforting Weekend Breakfasts  One-Pot Wonders   Fast Evening Meals  Easy [place] (Psychosocial): New Thoughts, New Behaviors Clinical staff led group instruction and group discussion with PowerPoint presentation and patient guidebook. To enhance the learning  environment the use of posters, models and videos may be added. Patients will learn and practice techniques for developing effective health and lifestyle goals. Patients will be able to effectively apply the goal setting process learned to develop at least one new personal goal.  The purpose of this lesson is to expose patients to a new skill set of behavior modification techniques such as techniques setting SMART goals, overcoming barriers, and achieving new thoughts and new behaviors.  Managing Moods and Relationships Clinical staff led group instruction and group discussion with PowerPoint presentation and patient guidebook. To enhance the learning environment the use of posters, models and videos may be added. Patients will learn how emotional and chronic stress  factors can impact their health and relationships. They will learn healthy ways to manage their moods and utilize positive coping mechanisms. In addition, ICR patients will learn ways to improve communication skills. The purpose of this lesson is to expose patients to ways of understanding how one's mood and health are intimately connected. Developing a healthy outlook can help build positive relationships and connections with others. Patients will understand the importance of utilizing effective communication skills that include actively listening and being heard. They will learn and understand the importance of the "4 Cs" and especially Connections in fostering of a Healthy Mind-Set.  Healthy Sleep for a Healthy Heart Clinical staff led group instruction and group discussion with PowerPoint presentation and patient guidebook. To enhance the learning environment the use of posters, models and videos may be added. At the conclusion of this workshop, patients will be able to demonstrate knowledge of the importance of sleep to overall health, well-being, and quality of life. They will understand the symptoms of, and treatments for, common sleep disorders. Patients will also be able to identify daytime and nighttime behaviors which impact sleep, and they will be able to apply these tools to help manage sleep-related challenges. The purpose of this lesson is to provide patients with a general overview of sleep and outline the importance of quality sleep. Patients will learn about a few of the most common sleep disorders. Patients will also be introduced to the concept of "sleep hygiene," and discover ways to self-manage certain sleeping problems through simple daily behavior changes. Finally, the workshop will motivate patients by clarifying the links between quality sleep and their goals of heart-healthy living.   Recognizing and Reducing Stress Clinical staff led group instruction and group discussion with  PowerPoint presentation and patient guidebook. To enhance the learning environment the use of posters, models and videos may be added. At the conclusion of this workshop, patients will be able to understand the types of stress reactions, differentiate between acute and chronic stress, and recognize the impact that chronic stress has on their health. They will also be able to apply different coping mechanisms, such as reframing negative self-talk. Patients will have the opportunity to practice a variety of stress management techniques, such as deep abdominal breathing, progressive muscle relaxation, and/or guided imagery.  The purpose of this lesson is to educate patients on the role of stress in their lives and to provide healthy techniques for coping with it.  Learning Barriers/Preferences:  Learning Barriers/Preferences - 12/07/21 1027       Learning Barriers/Preferences   Learning Barriers None    Learning Preferences Pictoral;Video             Education Topics:  Knowledge Questionnaire Score:  Knowledge Questionnaire Score - 12/07/21 1044       Knowledge Questionnaire  Score   Pre Score 23/24             Core Components/Risk Factors/Patient Goals at Admission:  Personal Goals and Risk Factors at Admission - 12/07/21 1044       Core Components/Risk Factors/Patient Goals on Admission   Hypertension Yes    Intervention Provide education on lifestyle modifcations including regular physical activity/exercise, weight management, moderate sodium restriction and increased consumption of fresh fruit, vegetables, and low fat dairy, alcohol moderation, and smoking cessation.;Monitor prescription use compliance.    Expected Outcomes Short Term: Continued assessment and intervention until BP is < 140/69m HG in hypertensive participants. < 130/863mHG in hypertensive participants with diabetes, heart failure or chronic kidney disease.;Long Term: Maintenance of blood pressure at goal  levels.    Lipids Yes    Intervention Provide education and support for participant on nutrition & aerobic/resistive exercise along with prescribed medications to achieve LDL '70mg'$ , HDL >'40mg'$ .    Expected Outcomes Short Term: Participant states understanding of desired cholesterol values and is compliant with medications prescribed. Participant is following exercise prescription and nutrition guidelines.;Long Term: Cholesterol controlled with medications as prescribed, with individualized exercise RX and with personalized nutrition plan. Value goals: LDL < '70mg'$ , HDL > 40 mg.             Core Components/Risk Factors/Patient Goals Review:    Core Components/Risk Factors/Patient Goals at Discharge (Final Review):    ITP Comments:  ITP Comments     Row Name 12/07/21 0912           ITP Comments Medical Director- Dr. TrFransico HimMD. Introduction to Pritikin Education Program/ Intensive Cardiac Rehab. Initial Pritikin Orientation Packet Reviewed with the Patient.                Comments: Participant attended orientation for the cardiac rehabilitation program on  12/07/2021  to perform initial intake and exercise walk test. Patient introduced to the PrThayneducation and orientation packet was reviewed. Completed 6-minute walk test, measurements, initial ITP, and exercise prescription. Vital signs stable. Telemetry-normal sinus rhythm, asymptomatic.   Service time was from 0912 to 1055.

## 2021-12-07 NOTE — Progress Notes (Signed)
Cardiac Rehab Medication Review by a Nurse  Does the patient  feel that his/her medications are working for him/her?  yes  Has the patient been experiencing any side effects to the medications prescribed?  no  Does the patient measure his/her own blood pressure or blood glucose at home?  yes   Does the patient have any problems obtaining medications due to transportation or finances?   no  Understanding of regimen: excellent Understanding of indications: good Potential of compliance: excellent    Nurse comments: Linna Hoff is taking his medications as prescribed and has a good understanding of what his medications are for. Dan checks his blood pressures daily.    Christa See [facility] RN 12/07/2021 10:01 AM

## 2021-12-13 ENCOUNTER — Encounter (HOSPITAL_COMMUNITY)
Admission: RE | Admit: 2021-12-13 | Discharge: 2021-12-13 | Disposition: A | Discharge status: Home / Self Care | Payer: Medicare Other | Source: Ambulatory Visit | Attending: Cardiology | Admitting: Cardiology

## 2021-12-13 DIAGNOSIS — Z955 Presence of coronary angioplasty implant and graft: Secondary | ICD-10-CM

## 2021-12-13 DIAGNOSIS — Z48812 Encounter for surgical aftercare following surgery on the circulatory system: Secondary | ICD-10-CM | POA: Diagnosis not present

## 2021-12-13 DIAGNOSIS — I214 Non-ST elevation (NSTEMI) myocardial infarction: Secondary | ICD-10-CM | POA: Diagnosis not present

## 2021-12-13 NOTE — Progress Notes (Signed)
Daily Session Note  Patient Details  Name: Andre Martinez MRN: [id number] Date of Birth: 27-Oct-1955 Referring Provider:   Flowsheet Row INTENSIVE CARDIAC REHAB ORIENT from 12/07/2021 in [facility]  Referring Provider Donato Heinz, MD       Encounter Date: 12/13/2021  Check In:  Session Check In - 12/13/21 1031       Check-In   Supervising physician immediately available to respond to emergencies [facility] immediately available    Physician(s) Dr. Posey Pronto    Location [facility]    Staff Present Barnet Pall, RN, Deland Pretty, MS, ACSM CEP, Exercise Physiologist;David Lilyan Punt, MS, ACSM-CEP, CCRP, Exercise Physiologist;Jetta Gilford Rile BS, ACSM EP-C, Exercise Physiologist;Other    Virtual Visit No    Medication changes reported     No    Fall or balance concerns reported    No    Tobacco Cessation No Change    Warm-up and Cool-down Performed as group-led instruction    Resistance Training Performed Yes    VAD Patient? No    PAD/SET Patient? No      Pain Assessment   Currently in Pain? No/denies    Pain Score 0-No pain    Multiple Pain Sites No             Capillary Blood Glucose: No results found for this or any previous visit (from the past 24 hour(s)).   Exercise Prescription Changes - 12/13/21 1022       Response to Exercise   Blood Pressure (Admit) 118/74    Blood Pressure (Exercise) 148/82    Blood Pressure (Exit) 108/70    Heart Rate (Admit) 66 bpm    Heart Rate (Exercise) 102 bpm    Heart Rate (Exit) 68 bpm    Rating of Perceived Exertion (Exercise) 12    Symptoms None    Comments Off to a great start with exercise.    Duration Continue with 30 min of aerobic exercise without signs/symptoms of physical distress.    Intensity THRR unchanged      Progression   Progression Continue to progress workloads to maintain intensity without signs/symptoms of physical distress.    Average  METs 3.5      Resistance Training   Training Prescription Yes    Weight 5 lbs    Reps 10-15    Time 10 Minutes      Interval Training   Interval Training No      Bike   Level 1.4    Minutes 15    METs 4.18      NuStep   Level 4    SPM 88    Minutes 15    METs 2.8             Social History   Tobacco Use  Smoking Status Former   Types: Cigars  Smokeless Tobacco Never    Goals Met:  Exercise tolerated well No report of concerns or symptoms today Strength training completed today  Goals Unmet:  Not Applicable  Comments:    Dr. Fransico Him is Medical Director for Cardiac Rehab at [facility] started cardiac rehab today.  Pt tolerated light exercise without difficulty. VSS, telemetry-NSR.  Medication list reconciled. Pt denies barriers to medicaiton compliance.  PSYCHOSOCIAL ASSESSMENT:  PHQ-24.35. Pt exhibits positive coping skills, hopeful outlook with supportive family. No psychosocial needs identified at this time, no psychosocial interventions necessary. Pt enjoys walking, spending time with family. Pt oriented  to exercise equipment and routine.    Understanding verbalized.

## 2021-12-15 ENCOUNTER — Encounter (HOSPITAL_COMMUNITY)
Admission: RE | Admit: 2021-12-15 | Discharge: 2021-12-15 | Disposition: A | Discharge status: Home / Self Care | Payer: Medicare Other | Source: Ambulatory Visit | Attending: Cardiology | Admitting: Cardiology

## 2021-12-15 DIAGNOSIS — I214 Non-ST elevation (NSTEMI) myocardial infarction: Secondary | ICD-10-CM | POA: Insufficient documentation

## 2021-12-15 DIAGNOSIS — Z955 Presence of coronary angioplasty implant and graft: Secondary | ICD-10-CM | POA: Insufficient documentation

## 2021-12-17 ENCOUNTER — Encounter (HOSPITAL_COMMUNITY)
Admission: RE | Admit: 2021-12-17 | Discharge: 2021-12-17 | Disposition: A | Discharge status: Home / Self Care | Payer: Medicare Other | Source: Ambulatory Visit | Attending: Cardiology | Admitting: Cardiology

## 2021-12-17 DIAGNOSIS — I214 Non-ST elevation (NSTEMI) myocardial infarction: Secondary | ICD-10-CM

## 2021-12-17 DIAGNOSIS — Z955 Presence of coronary angioplasty implant and graft: Secondary | ICD-10-CM

## 2021-12-18 ENCOUNTER — Other Ambulatory Visit: Payer: Self-pay | Admitting: Family Medicine

## 2021-12-18 DIAGNOSIS — E785 Hyperlipidemia, unspecified: Secondary | ICD-10-CM

## 2021-12-18 DIAGNOSIS — I10 Essential (primary) hypertension: Secondary | ICD-10-CM

## 2021-12-20 ENCOUNTER — Encounter (HOSPITAL_COMMUNITY)
Admission: RE | Admit: 2021-12-20 | Discharge: 2021-12-20 | Disposition: A | Discharge status: Home / Self Care | Payer: Medicare Other | Source: Ambulatory Visit | Attending: Cardiology | Admitting: Cardiology

## 2021-12-20 DIAGNOSIS — Z955 Presence of coronary angioplasty implant and graft: Secondary | ICD-10-CM

## 2021-12-20 DIAGNOSIS — I214 Non-ST elevation (NSTEMI) myocardial infarction: Secondary | ICD-10-CM | POA: Diagnosis not present

## 2021-12-21 NOTE — Progress Notes (Signed)
Cardiology Office Note:    Date:  12/30/2021   ID:  ARLEY SALAMONE, DOB 09-05-1955, MRN [id number]  PCP:  Denita Lung, MD  Cardiologist:  Donato Heinz, MD  Electrophysiologist:  None   Referring MD: Denita Lung, MD   Chief Complaint: follow-up of CAD  History of Present Illness:    Andre Martinez is a 66 y.o. male with a history of CAD with recent NSTEMI s/p DES to RCA on 09/30/2021, PVCs, hypertension, hyperlipidemia, and GERD who is followed by Dr. Gardiner Rhyme and presents today follow-up of CAD.  Patient was first seen by Cardiology during hospitalization in 09/2021 for NSTEMI after presenting with sudden onset of burning chest pain that radiated to both arms. LHC showed 50% stenosis of mid RCA followed by 90% stenosis of distal RCA but otherwise no CAD. He underwent successful PCI with DES to distal RCA lesion. Echo showed LVEF of 60-65% with normal wall motion and normal diastolic parameters. He was started on DAPT with Aspirin and Brilinta and home Lipitor was increased. He was noted to have some sinus bradycardia with short runs of NSVT. He was continued on home dose of Toprol-XL and a live monitor was placed prior to discharge. He was seen by me later that month for follow-up at which time he was doing relatively well. He was still having some daily burning in hist chest that was worse after meals and better with exercise. He stated this felt like his chronic reflux and not like his recent MI. He was still wearing his heart monitor at that time. His Omeprazole was stopped and he was started on Protonix instead. Following visit, monitor resulted and showed underlying sinus rhythm with 1 short run of non-sustained VT and 2 short runs of SVT as well as rare PACs/PVCs but no concerning arrhythmias. Patient triggered events corresponded with PVCs.  Patient presents today for follow-up. Patient is doing well since last visit. He has been going to Cardiac Rehab for the past 3  weeks and is doing well with this. He does report ongoing burning sensation in his chest basically every day. He has a long history of reflux and this feels like his typical indigestion. However, he also had chest burning when he presented with his NSTEMI but is was much more severe than his usual indigestion. Patient's current symptoms improve with exertion which points away from this being cardiac. He has seen GI in the past and had an EGD not too long ago and states it looked fine. He also reports some dysphagia. He states he has been told that he has esophageal spasms. He also has had esophageal strictures in the past requiring dilation. He denies any shortness of breath, orthopnea, PND, lower extremity edema, and palpitations. He reports some brief lightheadedness with standing too quickly but no significant dizziness or syncope.    Past Medical History:  Diagnosis Date   Allergy    Arthritis    Coronary artery disease    GERD (gastroesophageal reflux disease)    Hemorrhoids    Hyperlipidemia    Hypertension    Sinusitis 12/94 3/95    Past Surgical History:  Procedure Laterality Date   CARDIAC CATHETERIZATION     COLONOSCOPY  2006   DR.HAYES   CORONARY STENT INTERVENTION N/A 09/30/2021   Procedure: CORONARY STENT INTERVENTION;  Surgeon: Jettie Booze, MD;  Location: [facility];  Service: Cardiovascular;  Laterality: N/A;   LEFT HEART CATH AND CORONARY ANGIOGRAPHY  N/A 09/30/2021   Procedure: LEFT HEART CATH AND CORONARY ANGIOGRAPHY;  Surgeon: Jettie Booze, MD;  Location: [facility];  Service: Cardiovascular;  Laterality: N/A;   rectal fissure     REPAIR   SKIN BIOPSY Left 07/25/2018   see report in chat   SKIN BIOPSY Left 07/08/2021   dysplastic compound nevs with severe atypia margin close   SKIN BIOPSY Left 11/04/2021   no residual dysplastic nevu   TONSILLECTOMY     TOTAL KNEE ARTHROPLASTY Left 05/21/2018   Procedure: LEFT TOTAL KNEE  ARTHROPLASTY;  Surgeon: Gaynelle Arabian, MD;  Location: [facility];  Service: Orthopedics;  Laterality: Left;  59mn   UPPER GI ENDOSCOPY  04/08/2013   normal esophagus,multiple gastric polyps,normal examined duodenum    Current Medications: Current Meds  Medication Sig   aspirin 81 MG chewable tablet Chew 1 tablet (81 mg total) by mouth daily.   diclofenac Sodium (VOLTAREN) 1 % GEL Apply 1 Application topically 4 (four) times daily as needed (arthritis pain (knee)).   isosorbide mononitrate (IMDUR) 30 MG 24 hr tablet Take 1 tablet (30 mg total) by mouth daily.   metoprolol succinate (TOPROL-XL) 50 MG 24 hr tablet TAKE 1 TABLET BY MOUTH DAILY  WITH OR IMMEDIATELY FOLLOWING A  MEAL   Multiple Vitamin (MULTIVITAMIN) tablet Take 1 tablet by mouth in the morning. Centrum Silver   nitroGLYCERIN (NITROSTAT) 0.4 MG SL tablet Place 1 tablet (0.4 mg total) under the tongue every 5 (five) minutes x 3 doses as needed for chest pain.   pantoprazole (PROTONIX) 40 MG tablet Take 1 tablet (40 mg total) by mouth daily.   Polyethylene Glycol 400 (BLINK TEARS) 0.25 % SOLN Place 1-2 drops into both eyes 3 (three) times daily as needed (dry/irritated eyes.).   ticagrelor (BRILINTA) 90 MG TABS tablet Take 1 tablet (90 mg total) by mouth 2 (two) times daily.   [DISCONTINUED] atorvastatin (LIPITOR) 40 MG tablet Take 1 tablet (40 mg total) by mouth daily. (Patient taking differently: Take 40 mg by mouth every evening. Take is taking 20 mg daily)   [DISCONTINUED] lisinopril (ZESTRIL) 20 MG tablet Take 2 tablets (40 mg total) by mouth daily.     Allergies:   Aciphex [rabeprazole], Codeine, Prevacid [lansoprazole], and Penicillins   Social History   Socioeconomic History   Marital status: Married    Spouse name: Not on file   Number of children: Not on file   Years of education: Not on file   Highest education level: Master's degree (e.g., MA, MS, MEng, MEd, MSW, MBA)  Occupational History   Not on file  Tobacco  Use   Smoking status: Former    Types: Cigars   Smokeless tobacco: Never  Vaping Use   Vaping Use: Never used  Substance and Sexual Activity   Alcohol use: Yes    Alcohol/week: 3.0 standard drinks of alcohol    Types: 3 Cans of beer per week    Comment: occasionaly   Drug use: No   Sexual activity: Yes  Other Topics Concern   Not on file  Social History Narrative   Not on file   Social Determinants of Health   Financial Resource Strain: Not on file  Food Insecurity: Not on file  Transportation Needs: Not on file  Physical Activity: Not on file  Stress: Not on file  Social Connections: Not on file     Family History: The patient's family history includes Diabetes in his mother; Heart disease in his  father and paternal grandfather; Hypertension in his father and mother.  ROS:   Please see the history of present illness.     EKGs/Labs/Other Studies Reviewed:    The following studies were reviewed:  Left Cardiac Catheterization 09/30/2021:   Mid RCA lesion is 50% stenosed.   Dist RCA lesion is 90% stenosed.  A drug-eluting stent was successfully placed using a SYNERGY XD 2.50X16, postdilated to 3.0 mm.   Post intervention, there is a 0% residual stenosis.   The left ventricular systolic function is normal.   LV end diastolic pressure is normal.   The left ventricular ejection fraction is 55-65% by visual estimate.   There is no aortic valve stenosis.   Continue dual antiplatelet therapy along with aggressive secondary prevention.  He will need high-dose statin, healthy diet and regular exercise.   Results called to the wife.     Diagnostic Dominance: Right Intervention      _______________   Echocardiogram 10/01/2021: Impressions: 1. Left ventricular ejection fraction, by estimation, is 60 to 65%. The  left ventricle has normal function. The left ventricle has no regional  wall motion abnormalities. Left ventricular diastolic parameters were  normal.   2.  Right ventricular systolic function is normal. The right ventricular  size is normal.   3. The mitral valve is normal in structure. No evidence of mitral valve  regurgitation. No evidence of mitral stenosis.   4. The aortic valve is tricuspid. There is mild calcification of the  aortic valve. There is mild thickening of the aortic valve. Aortic valve  regurgitation is not visualized. Aortic valve sclerosis is present, with  no evidence of aortic valve stenosis.   5. The inferior vena cava is normal in size with greater than 50%  respiratory variability, suggesting right atrial pressure of 3 mmHg.  _______________  [facility] Reach Monitor 10/01/2021 to 10/15/2021: 1 episode of NSVT lasting 6 beats 2 episodes of SVT, longest lasting 5 beats  Patient had a min HR of 46 bpm, max HR of 148 bpm, and avg HR of 63 bpm. Predominant underlying rhythm was Sinus Rhythm. 1 run of Ventricular Tachycardia occurred lasting 6 beats with a max rate of 148 bpm (avg 123 bpm). 2 Supraventricular Tachycardia  runs occurred, the run with the fastest interval lasting 5 beats with a max rate of 133 bpm (avg 118 bpm); the run with the fastest interval was also the longest. Isolated SVEs were rare (<1.0%), SVE Couplets were rare (<1.0%), and SVE Triplets were rare  (<1.0%). Isolated VEs were rare (<1.0%), VE Couplets were rare (<1.0%), and no VE Triplets were present.  Patient triggered events corresponded to sinus rhythm  PVCs  EKG:  EKG not ordered today.   Recent Labs: 09/30/2021: ALT 21 10/01/2021: BUN 14; Creatinine, Ser 0.84; Hemoglobin 13.3; Platelets 253; Potassium 3.8; Sodium 139  Recent Lipid Panel    Component Value Date/Time   CHOL 107 10/01/2021 0152   CHOL 207 (H) 04/15/2021 1056   TRIG 104 10/01/2021 0152   HDL 35 (L) 10/01/2021 0152   HDL 53 04/15/2021 1056   CHOLHDL 3.1 10/01/2021 0152   VLDL 21 10/01/2021 0152   LDLCALC 51 10/01/2021 0152   LDLCALC 134 (H) 04/15/2021 1056    Physical Exam:    Vital  Signs: BP 110/76 (BP Location: Left Arm)   Pulse 76   Resp 20   Ht '6\' 2"'$  (1.88 m)   Wt 183 lb 9.6 oz (83.3 kg)   SpO2 98%  BMI 23.57 kg/m     Wt Readings from Last 3 Encounters:  12/30/21 183 lb 9.6 oz (83.3 kg)  12/07/21 186 lb 11.7 oz (84.7 kg)  10/13/21 194 lb (88 kg)     General: 66 y.o. think Caucasian male in no acute distress. HEENT: Normocephalic and atraumatic. Sclera clear.  Neck: Supple. No JVD. Heart: RRR. Distinct S1 and S2. No murmurs, gallops, or rubs. Radial and posterior tibial pulses 2+ and equal bilaterally. Lungs: No increased work of breathing. Clear to ausculation bilaterally. No wheezes, rhonchi, or rales.  Abdomen: Soft, non-distended, and non-tender to palpation. Bowel sounds present. Extremities: No lower extremity edema.    Skin: Warm and dry. Neuro: Alert and oriented x3. No focal deficits. Psych: Normal affect. Responds appropriately.  Assessment:    1. Coronary artery disease involving native coronary artery of native heart without angina pectoris   2. Sinus bradycardia   3. Frequent PVCs   4. NSVT (nonsustained ventricular tachycardia) ([facility])   5. Primary hypertension   6. Hyperlipidemia, unspecified hyperlipidemia type     Plan:    CAD Patient was admitted in 09/2021 with NSTEMI s/p DES to distal RCA.  - He continues to have some chest burning sensation that improves with exertion. He has a long history of indigestion and state current symptoms feel similar to this. However, he also presented with severe chest burning at the time of his NSTEMI. He has been seen by GI in the past and was told he may have esophageal spasms.  - Will start Imdur '15mg'$  daily to see if this helps his chest pain. This should help any microvascular disease as well as esophageal spasms.  - Continue DAPT with Aspirin and Brilinta.  - Continue beta-blocker and high-intensity statin.  - Advised patient to follow-up with GI if the Imdur does not help as I suspect current  symptoms are more GI related given they improve with exertion. However, advised patient to let us know if he has any similar to symptoms to what he had prior to NSTEMI or any new concerning symptoms.   Sinus Bradycardia Frequent PVCs Non-Sustained VT During admission in 09/2021, he was noted to have sinus bradycardia, frequent PVCc, and non-sustained VT on telemetry. Zio monitor showed underlying sinus rhythm with average heart rate of 63 bpm), 1 short run of non-sustained VT, and 2 short runs of SVT as well as rare PACs/PVCs but no concerning arrhythmias. Patient triggered events corresponded with PVCs.  - No recent palpitations.  - Continue Toprol-XL '50mg'$  daily.    Hypertension BP well controlled. - Continue Toprol-XL '50mg'$  daily.    Hyperlipidemia Lipid panel during recent admission: Total Cholesterol 107, Triglycerides 104, HDL 35, LDL 51. Lipoprotein A 205.5.  - There was some misunderstanding in what dose of Lipitor he should be taking. Lipitor was increased to '40mg'$  daily at time of discharge from NSTEMI but he has only been taking '20mg'$  daily. Patient is agreeable to increasing this to '40mg'$  daily in favor of a high-intensity statin. - Will repeat lipid panel and LFT in 2 months.  Disposition: Follow up in 6 months.    Medication Adjustments/Labs and Tests Ordered: Current medicines are reviewed at length with the patient today.  Concerns regarding medicines are outlined above.  No orders of the defined types were placed in this encounter.  Meds ordered this encounter  Medications   isosorbide mononitrate (IMDUR) 30 MG 24 hr tablet    Sig: Take 1 tablet (30 mg total) by mouth  daily.    Dispense:  30 tablet    Refill:  0   DISCONTD: lisinopril (ZESTRIL) 20 MG tablet    Sig: Take 2 tablets (40 mg total) by mouth daily.    Dispense:  180 tablet    Refill:  3   atorvastatin (LIPITOR) 40 MG tablet    Sig: Take 1 tablet (40 mg total) by mouth daily.    Dispense:  90 tablet     Refill:  3    Order Specific Question:   Supervising Provider    Answer:   Skeet Latch [[id number]]    Patient Instructions  Medication Instructions:  START Imdur 15 mg one tablet daily (1 month supply only) Lipitor 40 mg one tablet daily  *If you need a refill on your cardiac medications before your next appointment, please call your pharmacy*   Lab Work: NONE ordered at this time of appointment   If you have labs (blood work) drawn today and your tests are completely normal, you will receive your results only by: [place] (if you have MyChart) OR A paper copy in the mail If you have any lab test that is abnormal or we need to change your treatment, we will call you to review the results.   Testing/Procedures: NONE ordered at this time of appointment     Follow-Up: At [facility], you and your health needs are our priority.  As part of our continuing mission to provide you with exceptional heart care, we have created designated Provider Care Teams.  These Care Teams include your primary Cardiologist (physician) and Advanced Practice Providers (APPs -  Physician Assistants and Nurse Practitioners) who all work together to provide you with the care you need, when you need it.  We recommend signing up for the patient portal called "MyChart".  Sign up information is provided on this After Visit Summary.  MyChart is used to connect with patients for Virtual Visits (Telemedicine).  Patients are able to view lab/test results, encounter notes, upcoming appointments, etc.  Non-urgent messages can be sent to your provider as well.   To learn more about what you can do with MyChart, go to [email or web address].    Your next appointment:   6 month(s)  The format for your next appointment:   In Person  Provider:   Donato Heinz, MD  or Sande Rives, PA-C        Other Instructions   Important Information About Sugar         Signed, Eppie Gibson  12/30/2021 8:46 PM    [place]

## 2021-12-22 ENCOUNTER — Encounter (HOSPITAL_COMMUNITY)
Admission: RE | Admit: 2021-12-22 | Discharge: 2021-12-22 | Disposition: A | Discharge status: Home / Self Care | Payer: Medicare Other | Source: Ambulatory Visit | Attending: Cardiology | Admitting: Cardiology

## 2021-12-22 DIAGNOSIS — Z955 Presence of coronary angioplasty implant and graft: Secondary | ICD-10-CM | POA: Diagnosis not present

## 2021-12-22 DIAGNOSIS — I214 Non-ST elevation (NSTEMI) myocardial infarction: Secondary | ICD-10-CM | POA: Diagnosis not present

## 2021-12-24 ENCOUNTER — Encounter (HOSPITAL_COMMUNITY)
Admission: RE | Admit: 2021-12-24 | Discharge: 2021-12-24 | Disposition: A | Discharge status: Home / Self Care | Payer: Medicare Other | Source: Ambulatory Visit | Attending: Cardiology | Admitting: Cardiology

## 2021-12-24 DIAGNOSIS — Z955 Presence of coronary angioplasty implant and graft: Secondary | ICD-10-CM | POA: Diagnosis not present

## 2021-12-24 DIAGNOSIS — I214 Non-ST elevation (NSTEMI) myocardial infarction: Secondary | ICD-10-CM | POA: Diagnosis not present

## 2021-12-24 NOTE — Progress Notes (Signed)
Reviewed home exercise guidelines with patient including endpoints, temperature precautions, target heart rate and rate of perceived exertion. Patient is currently walking, riding his recumbent bike (RB), and/or elliptical machine 30 minutes 4 days/week as his mode of home exercise. Patient stretches daily and uses light hand weights (3 or 5 lbs) while riding the Golden for his resistance training. Patient has a smart watch to check his pulse. Patient voices understanding of instructions given.  Sol Passer, MS, ACSM CEP

## 2021-12-27 ENCOUNTER — Encounter (HOSPITAL_COMMUNITY)
Admission: RE | Admit: 2021-12-27 | Discharge: 2021-12-27 | Disposition: A | Discharge status: Home / Self Care | Payer: Medicare Other | Source: Ambulatory Visit | Attending: Cardiology | Admitting: Cardiology

## 2021-12-27 DIAGNOSIS — I214 Non-ST elevation (NSTEMI) myocardial infarction: Secondary | ICD-10-CM | POA: Diagnosis not present

## 2021-12-27 DIAGNOSIS — Z955 Presence of coronary angioplasty implant and graft: Secondary | ICD-10-CM | POA: Diagnosis not present

## 2021-12-29 ENCOUNTER — Encounter (HOSPITAL_COMMUNITY)
Admission: RE | Admit: 2021-12-29 | Discharge: 2021-12-29 | Disposition: A | Discharge status: Home / Self Care | Payer: Medicare Other | Source: Ambulatory Visit | Attending: Cardiology | Admitting: Cardiology

## 2021-12-29 DIAGNOSIS — Z955 Presence of coronary angioplasty implant and graft: Secondary | ICD-10-CM | POA: Diagnosis not present

## 2021-12-29 DIAGNOSIS — I214 Non-ST elevation (NSTEMI) myocardial infarction: Secondary | ICD-10-CM

## 2021-12-30 ENCOUNTER — Encounter: Payer: Self-pay | Admitting: Student

## 2021-12-30 ENCOUNTER — Ambulatory Visit: Payer: Medicare Other | Admitting: Student

## 2021-12-30 VITALS — BP 110/76 | HR 76 | Resp 20 | Ht 74.0 in | Wt 183.6 lb

## 2021-12-30 DIAGNOSIS — I10 Essential (primary) hypertension: Secondary | ICD-10-CM | POA: Diagnosis not present

## 2021-12-30 DIAGNOSIS — I493 Ventricular premature depolarization: Secondary | ICD-10-CM | POA: Diagnosis not present

## 2021-12-30 DIAGNOSIS — I2510 Atherosclerotic heart disease of native coronary artery without angina pectoris: Secondary | ICD-10-CM

## 2021-12-30 DIAGNOSIS — E785 Hyperlipidemia, unspecified: Secondary | ICD-10-CM

## 2021-12-30 DIAGNOSIS — R001 Bradycardia, unspecified: Secondary | ICD-10-CM | POA: Diagnosis not present

## 2021-12-30 DIAGNOSIS — I4729 Other ventricular tachycardia: Secondary | ICD-10-CM

## 2021-12-30 MED ORDER — ISOSORBIDE MONONITRATE ER 30 MG PO TB24: 30.0000 mg | ORAL_TABLET | Freq: Every day | ORAL | 0 refills | Status: DC

## 2021-12-30 MED ORDER — ATORVASTATIN CALCIUM 40 MG PO TABS: 40.0000 mg | ORAL_TABLET | Freq: Every day | ORAL | 3 refills | Status: DC

## 2021-12-30 MED ORDER — LISINOPRIL 20 MG PO TABS: 40.0000 mg | ORAL_TABLET | Freq: Every day | ORAL | 3 refills | Status: DC

## 2021-12-30 NOTE — Patient Instructions (Addendum)
Medication Instructions:  START Imdur 15 mg one tablet daily (1 month supply only) Lipitor 40 mg one tablet daily  *If you need a refill on your cardiac medications before your next appointment, please call your pharmacy*   Lab Work: NONE ordered at this time of appointment   If you have labs (blood work) drawn today and your tests are completely normal, you will receive your results only by: [place] (if you have MyChart) OR A paper copy in the mail If you have any lab test that is abnormal or we need to change your treatment, we will call you to review the results.   Testing/Procedures: NONE ordered at this time of appointment     Follow-Up: At [facility], you and your health needs are our priority.  As part of our continuing mission to provide you with exceptional heart care, we have created designated Provider Care Teams.  These Care Teams include your primary Cardiologist (physician) and Advanced Practice Providers (APPs -  Physician Assistants and Nurse Practitioners) who all work together to provide you with the care you need, when you need it.  We recommend signing up for the patient portal called "MyChart".  Sign up information is provided on this After Visit Summary.  MyChart is used to connect with patients for Virtual Visits (Telemedicine).  Patients are able to view lab/test results, encounter notes, upcoming appointments, etc.  Non-urgent messages can be sent to your provider as well.   To learn more about what you can do with MyChart, go to [email or web address].    Your next appointment:   6 month(s)  The format for your next appointment:   In Person  Provider:   Donato Heinz, MD  or Sande Rives, PA-C        Other Instructions   Important Information About Sugar

## 2021-12-31 ENCOUNTER — Encounter (HOSPITAL_COMMUNITY)
Admission: RE | Admit: 2021-12-31 | Discharge: 2021-12-31 | Disposition: A | Discharge status: Home / Self Care | Payer: Medicare Other | Source: Ambulatory Visit | Attending: Cardiology | Admitting: Cardiology

## 2021-12-31 DIAGNOSIS — Z955 Presence of coronary angioplasty implant and graft: Secondary | ICD-10-CM

## 2021-12-31 DIAGNOSIS — I214 Non-ST elevation (NSTEMI) myocardial infarction: Secondary | ICD-10-CM | POA: Diagnosis not present

## 2021-12-31 NOTE — Telephone Encounter (Signed)
Unfortunately, I don't think there is a way to completely remove it. When I stopped it though, I did put "entry error" as the reason so if anyone clicks on that they should be able to tell.  Thank you!

## 2022-01-03 ENCOUNTER — Encounter (HOSPITAL_COMMUNITY)
Admission: RE | Admit: 2022-01-03 | Discharge: 2022-01-03 | Disposition: A | Discharge status: Home / Self Care | Payer: Medicare Other | Source: Ambulatory Visit | Attending: Cardiology | Admitting: Cardiology

## 2022-01-03 DIAGNOSIS — Z955 Presence of coronary angioplasty implant and graft: Secondary | ICD-10-CM | POA: Diagnosis not present

## 2022-01-03 DIAGNOSIS — I214 Non-ST elevation (NSTEMI) myocardial infarction: Secondary | ICD-10-CM | POA: Diagnosis not present

## 2022-01-03 MED ORDER — AMLODIPINE BESYLATE 2.5 MG PO TABS: 2.5000 mg | ORAL_TABLET | Freq: Every day | ORAL | 6 refills | Status: DC

## 2022-01-03 NOTE — Telephone Encounter (Signed)
Sounds good. Let's stop Imdur then and start Amlodipine 2.'5mg'$  once daily.   Thank you! Jakaiden Fill

## 2022-01-04 NOTE — Progress Notes (Signed)
Cardiac Individual Treatment Plan  Patient Details  Name: Andre Martinez MRN: [id number] Date of Birth: 06-May-1956 Referring Provider:   Flowsheet Row INTENSIVE CARDIAC REHAB ORIENT from 12/07/2021 in [facility] [place]  Referring Provider Donato Heinz, MD       Initial Encounter Date:  [place] from 12/07/2021 in [place]  Date 12/07/21       Visit Diagnosis: 09/30/21 NSTEMI  09/30/21 DES RCA  Patient's Home Medications on Admission:  Current Outpatient Medications:    amLODipine (NORVASC) 2.5 MG tablet, Take 1 tablet (2.5 mg total) by mouth daily., Disp: 30 tablet, Rfl: 6   aspirin 81 MG chewable tablet, Chew 1 tablet (81 mg total) by mouth daily., Disp: , Rfl:    atorvastatin (LIPITOR) 40 MG tablet, Take 1 tablet (40 mg total) by mouth daily., Disp: 90 tablet, Rfl: 3   diclofenac Sodium (VOLTAREN) 1 % GEL, Apply 1 Application topically 4 (four) times daily as needed (arthritis pain (knee))., Disp: , Rfl:    metoprolol succinate (TOPROL-XL) 50 MG 24 hr tablet, TAKE 1 TABLET BY MOUTH DAILY  WITH OR IMMEDIATELY FOLLOWING A  MEAL, Disp: 90 tablet, Rfl: 1   Multiple Vitamin (MULTIVITAMIN) tablet, Take 1 tablet by mouth in the morning. Centrum Silver, Disp: , Rfl:    nitroGLYCERIN (NITROSTAT) 0.4 MG SL tablet, Place 1 tablet (0.4 mg total) under the tongue every 5 (five) minutes x 3 doses as needed for chest pain., Disp: 25 tablet, Rfl: 12   pantoprazole (PROTONIX) 40 MG tablet, Take 1 tablet (40 mg total) by mouth daily., Disp: 90 tablet, Rfl: 3   Polyethylene Glycol 400 (BLINK TEARS) 0.25 % SOLN, Place 1-2 drops into both eyes 3 (three) times daily as needed (dry/irritated eyes.)., Disp: , Rfl:    ticagrelor (BRILINTA) 90 MG TABS tablet, Take 1 tablet (90 mg total) by mouth 2 (two) times daily., Disp: 180 tablet, Rfl: 1  Past Medical History: Past Medical History:  Diagnosis  Date   Allergy    Arthritis    Coronary artery disease    GERD (gastroesophageal reflux disease)    Hemorrhoids    Hyperlipidemia    Hypertension    Sinusitis 12/94 3/95    Tobacco Use: Social History   Tobacco Use  Smoking Status Former   Types: Cigars  Smokeless Tobacco Never    Labs: Review Flowsheet  More data exists      Latest Ref Rng & Units 03/06/2018 04/10/2019 04/13/2020 04/15/2021 10/01/2021  Labs for ITP Cardiac and Pulmonary Rehab  Cholestrol 0 - 200 mg/dL 203  191  198  207  107   LDL (calc) 0 - 99 mg/dL 109  116  128  134  51   HDL-C >40 mg/dL 55  60  53  53  35   Trlycerides <150 mg/dL 193  84  97  113  104   Hemoglobin A1c 4.8 - 5.6 % - - - - 5.8     Capillary Blood Glucose: No results found for: "GLUCAP"   Exercise Target Goals: Exercise Program Goal: Individual exercise prescription set using results from initial 6 min walk test and THRR while considering  patient's activity barriers and safety.   Exercise Prescription Goal: Initial exercise prescription builds to 30-45 minutes a day of aerobic activity, 2-3 days per week.  Home exercise guidelines will be given to patient during program as part of exercise prescription that the  participant will acknowledge.  Activity Barriers & Risk Stratification:  Activity Barriers & Cardiac Risk Stratification - 12/07/21 0947       Activity Barriers & Cardiac Risk Stratification   Activity Barriers Arthritis;Left Knee Replacement;Other (comment)    Comments Needs right knee replacement, wears brace on right knee, gel and hydrocortisone injections.    Cardiac Risk Stratification Moderate             6 Minute Walk:  6 Minute Walk     Row Name 12/07/21 0935         6 Minute Walk   Phase Initial     Distance 1917 feet     Walk Time 6 minutes     # of Rest Breaks 0     MPH 3.63     METS 4.5     RPE 12     Perceived Dyspnea  0     VO2 Peak 15.76     Symptoms No     Resting HR 69 bpm      Resting BP 124/72     Resting Oxygen Saturation  99 %     Exercise Oxygen Saturation  during 6 min walk 100 %     Max Ex. HR 96 bpm     Max Ex. BP 140/80     2 Minute Post BP 112/64              Oxygen Initial Assessment:   Oxygen Re-Evaluation:   Oxygen Discharge (Final Oxygen Re-Evaluation):   Initial Exercise Prescription:  Initial Exercise Prescription - 12/07/21 1000       Date of Initial Exercise RX and Referring Provider   Date 12/07/21    Referring Provider Donato Heinz, MD    Expected Discharge Date 02/11/22      Bike   Level 1.6    Minutes 15    METs 4.56      NuStep   Level 4    SPM 85    Minutes 15    METs 3.5      Prescription Details   Frequency (times per week) 3    Duration Progress to 30 minutes of continuous aerobic without signs/symptoms of physical distress      Intensity   THRR 40-80% of Max Heartrate 61-122    Ratings of Perceived Exertion 11-13    Perceived Dyspnea 0-4      Progression   Progression Continue to progress workloads to maintain intensity without signs/symptoms of physical distress.      Resistance Training   Training Prescription Yes    Weight 5 lbs    Reps 10-15             Perform Capillary Blood Glucose checks as needed.  Exercise Prescription Changes:   Exercise Prescription Changes     Row Name 12/13/21 1022 12/20/21 1031 01/03/22 1029         Response to Exercise   Blood Pressure (Admit) 118/74 138/78 112/62     Blood Pressure (Exercise) 148/82 128/80 158/82     Blood Pressure (Exit) 108/70 104/70 102/60     Heart Rate (Admit) 66 bpm 74 bpm 66 bpm     Heart Rate (Exercise) 102 bpm 105 bpm 112 bpm     Heart Rate (Exit) 68 bpm 73 bpm 66 bpm     Rating of Perceived Exertion (Exercise) '12 12 13     '$ Symptoms None None None     Comments Off to a  great start with exercise. [employer] on [employer] bike. --     Duration Continue with 30 min of aerobic exercise without signs/symptoms of physical  distress. Continue with 30 min of aerobic exercise without signs/symptoms of physical distress. Continue with 30 min of aerobic exercise without signs/symptoms of physical distress.     Intensity THRR unchanged THRR unchanged THRR unchanged       Progression   Progression Continue to progress workloads to maintain intensity without signs/symptoms of physical distress. Continue to progress workloads to maintain intensity without signs/symptoms of physical distress. Continue to progress workloads to maintain intensity without signs/symptoms of physical distress.     Average METs 3.5 4.9 4.8       Resistance Training   Training Prescription Yes Yes Yes     Weight 5 lbs 5 lbs 5 lbs     Reps 10-15 10-15 10-15     Time 10 Minutes 10 Minutes 10 Minutes       Interval Training   Interval Training No No No       Bike   Level 1.4 2.2 2     Minutes '15 15 15     '$ METs 4.18 5.94 5.55       NuStep   Level '4 5 6     '$ SPM 88 88 88     Minutes '15 15 15     '$ METs 2.8 3.8 4.1       Home Exercise Plan   Plans to continue exercise at -- -- Home (comment)  walking, recumbent bike, elliptical machine     Frequency -- -- Add 4 additional days to program exercise sessions.     Initial Home Exercises Provided -- -- 12/24/21              Exercise Comments:   Exercise Comments     Row Name 12/13/21 1126 12/20/21 1044 12/24/21 1054 01/03/22 1116     Exercise Comments Patient toelrated 1st session of exericse well without symptoms. Reviewed METs with patient. Reviewed home exercise guidelines and goals with patient. Reviewed METs with patient.             Exercise Goals and Review:   Exercise Goals     Row Name 12/07/21 0949             Exercise Goals   Increase Physical Activity Yes       Intervention Provide advice, education, support and counseling about physical activity/exercise needs.;Develop an individualized exercise prescription for aerobic and resistive training based on  initial evaluation findings, risk stratification, comorbidities and participant's personal goals.       Expected Outcomes Short Term: Attend rehab on a regular basis to increase amount of physical activity.;Long Term: Exercising regularly at least 3-5 days a week.;Long Term: Add in home exercise to make exercise part of routine and to increase amount of physical activity.       Increase Strength and Stamina Yes       Intervention Provide advice, education, support and counseling about physical activity/exercise needs.;Develop an individualized exercise prescription for aerobic and resistive training based on initial evaluation findings, risk stratification, comorbidities and participant's personal goals.       Expected Outcomes Short Term: Increase workloads from initial exercise prescription for resistance, speed, and METs.;Short Term: Perform resistance training exercises routinely during rehab and add in resistance training at home;Long Term: Improve cardiorespiratory fitness, muscular endurance and strength as measured by increased METs and functional capacity (6MWT)  Able to understand and use rate of perceived exertion (RPE) scale Yes       Intervention Provide education and explanation on how to use RPE scale       Expected Outcomes Short Term: Able to use RPE daily in rehab to express subjective intensity level;Long Term:  Able to use RPE to guide intensity level when exercising independently       Knowledge and understanding of Target Heart Rate Range (THRR) Yes       Intervention Provide education and explanation of THRR including how the numbers were predicted and where they are located for reference       Expected Outcomes Short Term: Able to state/look up THRR;Long Term: Able to use THRR to govern intensity when exercising independently;Short Term: Able to use daily as guideline for intensity in rehab       Able to check pulse independently Yes       Intervention Provide education and  demonstration on how to check pulse in carotid and radial arteries.;Review the importance of being able to check your own pulse for safety during independent exercise       Expected Outcomes Short Term: Able to explain why pulse checking is important during independent exercise;Long Term: Able to check pulse independently and accurately       Understanding of Exercise Prescription Yes       Intervention Provide education, explanation, and written materials on patient's individual exercise prescription       Expected Outcomes Short Term: Able to explain program exercise prescription;Long Term: Able to explain home exercise prescription to exercise independently                Exercise Goals Re-Evaluation :  Exercise Goals Re-Evaluation     Row Name 12/13/21 1126 12/24/21 1435           Exercise Goal Re-Evaluation   Exercise Goals Review Increase Physical Activity;Able to understand and use rate of perceived exertion (RPE) scale Increase Physical Activity;Able to understand and use rate of perceived exertion (RPE) scale;Increase Strength and Stamina;Knowledge and understanding of Target Heart Rate Range (THRR);Able to check pulse independently;Understanding of Exercise Prescription      Comments Patient able to understand and use RPE scale appropriately. Reviewed exercise prescription with patient. Patient is currently walking, riding his recumbent bike (RB), and/or elliptical machine 30 minutes 4 days/week as his mode of home exercise. Patient stretches daily and uses light hand weights (3 or 5 lbs) while riding the [place] for his resistance training. Patient has a smart watch to check his pulse. Patient's goal is to eat right, maintain weight, and exercise daily.      Expected Outcomes Progress workloads as tolerated to help improve cardiorespiratory fitness. Continue to progress workloads as tolerated. Continue daily exercise routine.               Discharge Exercise Prescription (Final  Exercise Prescription Changes):  Exercise Prescription Changes - 01/03/22 1029       Response to Exercise   Blood Pressure (Admit) 112/62    Blood Pressure (Exercise) 158/82    Blood Pressure (Exit) 102/60    Heart Rate (Admit) 66 bpm    Heart Rate (Exercise) 112 bpm    Heart Rate (Exit) 66 bpm    Rating of Perceived Exertion (Exercise) 13    Symptoms None    Duration Continue with 30 min of aerobic exercise without signs/symptoms of physical distress.    Intensity THRR unchanged  Progression   Progression Continue to progress workloads to maintain intensity without signs/symptoms of physical distress.    Average METs 4.8      Resistance Training   Training Prescription Yes    Weight 5 lbs    Reps 10-15    Time 10 Minutes      Interval Training   Interval Training No      Bike   Level 2    Minutes 15    METs 5.55      NuStep   Level 6    SPM 88    Minutes 15    METs 4.1      Home Exercise Plan   Plans to continue exercise at Home (comment)   walking, recumbent bike, elliptical machine   Frequency Add 4 additional days to program exercise sessions.    Initial Home Exercises Provided 12/24/21             Nutrition:  Target Goals: Understanding of nutrition guidelines, daily intake of sodium '1500mg'$ , cholesterol '200mg'$ , calories 30% from fat and 7% or less from saturated fats, daily to have 5 or more servings of fruits and vegetables.  Biometrics:  Pre Biometrics - 12/07/21 0912       Pre Biometrics   Waist Circumference 35.5 inches    Hip Circumference 39 inches    Waist to Hip Ratio 0.91 %    Triceps Skinfold 6 mm    % Body Fat 20 %    Grip Strength 47 kg    Flexibility 12.25 in    Single Leg Stand 30 seconds              Nutrition Therapy Plan and Nutrition Goals:  Nutrition Therapy & Goals - 12/15/21 1646       Nutrition Therapy   Diet Heart Healthy Diet    Drug/Food Interactions Statins/Certain Fruits      Personal Nutrition  Goals   Nutrition Goal Patient to choose a daily variety of fruits, vegetables, whole grains, lean protein/plant protein, nonfat dairy as part of heart healthy lifestyle    Personal Goal #2 Patient to limit daily sodium intake to '1500mg'$ .    Personal Goal #3 Patient to identify and limit daily intake of saturated fat, trans fat, sodium, and refined carbohydrates.      Intervention Plan   Intervention Prescribe, educate and counsel regarding individualized specific dietary modifications aiming towards targeted core components such as weight, hypertension, lipid management, diabetes, heart failure and other comorbidities.    Expected Outcomes Short Term Goal: Understand basic principles of dietary content, such as calories, fat, sodium, cholesterol and nutrients.;Long Term Goal: Adherence to prescribed nutrition plan.             Nutrition Assessments:  Nutrition Assessments - 12/15/21 1655       Rate Your Plate Scores   Pre Score 72            MEDIFICTS Score Key: ?70 Need to make dietary changes  40-70 Heart Healthy Diet ? 40 Therapeutic Level Cholesterol Diet   Flowsheet Row INTENSIVE CARDIAC REHAB from 12/15/2021 in [place]  Picture Your Plate Total Score on Admission 72      Picture Your Plate Scores: <78 Unhealthy dietary pattern with much room for improvement. 41-50 Dietary pattern unlikely to meet recommendations for good health and room for improvement. 51-60 More healthful dietary pattern, with some room for improvement.  >60 Healthy dietary pattern, although there may be  some specific behaviors that could be improved.    Nutrition Goals Re-Evaluation:  Nutrition Goals Re-Evaluation     [place] Name 12/15/21 1646             Goals   Current Weight 183 lb 13.8 oz (83.4 kg)       Comment Most recent labs show A1c 5.8 and improved lipid panel with HDL 35.                Nutrition Goals Re-Evaluation:  Nutrition Goals  Re-Evaluation     [place] Name 12/15/21 1646             Goals   Current Weight 183 lb 13.8 oz (83.4 kg)       Comment Most recent labs show A1c 5.8 and improved lipid panel with HDL 35.                Nutrition Goals Discharge (Final Nutrition Goals Re-Evaluation):  Nutrition Goals Re-Evaluation - 12/15/21 1646       Goals   Current Weight 183 lb 13.8 oz (83.4 kg)    Comment Most recent labs show A1c 5.8 and improved lipid panel with HDL 35.             Psychosocial: Target Goals: Acknowledge presence or absence of significant depression and/or stress, maximize coping skills, provide positive support system. Participant is able to verbalize types and ability to use techniques and skills needed for reducing stress and depression.  Initial Review & Psychosocial Screening:  Initial Psych Review & Screening - 12/07/21 1031       Initial Review   Current issues with None Identified      Family Dynamics   Good Support System? Yes   Linna Hoff has his wife and children for support     Barriers   Psychosocial barriers to participate in program There are no identifiable barriers or psychosocial needs.      Screening Interventions   Interventions Encouraged to exercise             Quality of Life Scores:  Quality of Life - 12/07/21 1043       Quality of Life   Select Quality of Life      Quality of Life Scores   Health/Function Pre 21.6 %    Socioeconomic Pre 26.14 %    Psych/Spiritual Pre 26.14 %    Family Pre 27.6 %    GLOBAL Pre 24.35 %            Scores of 19 and below usually indicate a poorer quality of life in these areas.  A difference of  2-3 points is a clinically meaningful difference.  A difference of 2-3 points in the total score of the Quality of Life Index has been associated with significant improvement in overall quality of life, self-image, physical symptoms, and general health in studies assessing change in quality of life.  PHQ-9: Review  Flowsheet  More data exists      12/07/2021 04/15/2021 04/13/2020 04/10/2019 03/06/2018  Depression screen PHQ 2/9  Decreased Interest 0 0 0 0 0  Down, Depressed, Hopeless 0 0 0 0 0  PHQ - 2 Score 0 0 0 0 0   Interpretation of Total Score  Total Score Depression Severity:  1-4 = Minimal depression, 5-9 = Mild depression, 10-14 = Moderate depression, 15-19 = Moderately severe depression, 20-27 = Severe depression   Psychosocial Evaluation and Intervention:   Psychosocial Re-Evaluation:  Psychosocial Re-Evaluation  [place] Name 12/13/21 1427 01/04/22 1133           Psychosocial Re-Evaluation   Current issues with None Identified None Identified      Interventions Encouraged to attend Cardiac Rehabilitation for the exercise Encouraged to attend Cardiac Rehabilitation for the exercise      Continue Psychosocial Services  No Follow up required No Follow up required               Psychosocial Discharge (Final Psychosocial Re-Evaluation):  Psychosocial Re-Evaluation - 01/04/22 1133       Psychosocial Re-Evaluation   Current issues with None Identified    Interventions Encouraged to attend Cardiac Rehabilitation for the exercise    Continue Psychosocial Services  No Follow up required             Vocational Rehabilitation: Provide vocational rehab assistance to qualifying candidates.   Vocational Rehab Evaluation & Intervention:  Vocational Rehab - 12/07/21 1008       Initial Vocational Rehab Evaluation & Intervention   Assessment shows need for Vocational Rehabilitation No   Linna Hoff is retired and does not need vocational rehab at this time.            Education: Education Goals: Education classes will be provided on a weekly basis, covering required topics. Participant will state understanding/return demonstration of topics presented.    Education     Row Name 12/13/21 1300     Education   Cardiac Education Topics Pritikin   Scientist, research (life sciences) Psychosocial   Psychosocial Workshop New Thoughts, New Behaviors   Instruction Review Code 1- Verbalizes Understanding   Class Start Time 1137   Class Stop Time 1225   Class Time Calculation (min) 48 min    [place] Name 12/15/21 1300     Education   Cardiac Education Topics Pritikin   Financial trader   Weekly Topic One-Pot Wonders   Instruction Review Code 1- Verbalizes Understanding   Class Start Time 1145   Class Stop Time 1227   Class Time Calculation (min) 42 min    [place] Name 12/17/21 1400     Education   Cardiac Education Topics Pritikin   Lexicographer Nutrition   Nutrition Dining Out - Part 1   Instruction Review Code 1- Verbalizes Understanding   Class Start Time 1137   Class Stop Time 1230   Class Time Calculation (min) 53 min    [place] Name 12/20/21 1200     Education   Cardiac Education Topics Pritikin   Scientist, research (life sciences)   Educator Exercise Physiologist   Select Exercise Education   Exercise Education Biomechanial Limitations   Instruction Review Code 1- Verbalizes Understanding   Class Start Time 1139   Class Stop Time 1217   Class Time Calculation (min) 38 min    [place] Name 12/22/21 1300     Education   Cardiac Education Topics [place]   Educator Dietitian   Weekly Topic Fast Evening Meals   Instruction Review Code 1- Verbalizes Understanding   Class Start Time 1137   Class Stop Time 1223   Class Time Calculation (min) 46 min    [place] Name 12/24/21 1200  Education   Cardiac Education Topics Pritikin   Academic librarian Psychosocial   Psychosocial How Our Thoughts Can Heal Our Hearts   Instruction Review Code 1- Verbalizes Understanding   Class Start Time 1152   Class  Stop Time 1235   Class Time Calculation (min) 43 min    [place] Name 12/27/21 1600     Education   Cardiac Education Topics Pritikin   IT sales professional Nutrition   Nutrition Workshop Fueling a Designer, multimedia   Instruction Review Code 1- Information systems manager   Class Start Time 1150   Class Stop Time 1240   Class Time Calculation (min) 50 min    [place] Name 12/29/21 1200     Education   Cardiac Education Topics Pritikin   Financial trader   Weekly Topic Adding Flavor - Sodium-Free   Instruction Review Code 1- Verbalizes Understanding   Class Start Time 1138   Class Stop Time 1228   Class Time Calculation (min) 50 min    [place] Name 12/31/21 1200     Education   Cardiac Education Topics Pritikin   Tax inspector General Education   General Education Heart Disease Risk Reduction   Instruction Review Code 1- Verbalizes Understanding   Class Start Time 1145   Class Stop Time 1221   Class Time Calculation (min) 36 min    [place] Name 01/03/22 1300     Education   Cardiac Education Topics Pritikin   [employer]     Workshops   Educator Exercise Physiologist   Select Psychosocial   Psychosocial Workshop Recognizing and Reducing Stress   Instruction Review Code 1- Verbalizes Understanding   Class Start Time 1150   Class Stop Time 1238   Class Time Calculation (min) 48 min            Core Videos: Exercise    Move It!  Clinical staff conducted group or individual video education with verbal and written material and guidebook.  Patient learns the recommended Pritikin exercise program. Exercise with the goal of living a long, healthy life. Some of the health benefits of exercise include controlled diabetes, healthier blood pressure levels, improved cholesterol levels, improved heart and lung capacity, improved sleep, and better body  composition. Everyone should speak with their doctor before starting or changing an exercise routine.  Biomechanical Limitations Clinical staff conducted group or individual video education with verbal and written material and guidebook.  Patient learns how biomechanical limitations can impact exercise and how we can mitigate and possibly overcome limitations to have an impactful and balanced exercise routine.  Body Composition Clinical staff conducted group or individual video education with verbal and written material and guidebook.  Patient learns that body composition (ratio of muscle mass to fat mass) is a key component to assessing overall fitness, rather than body weight alone. Increased fat mass, especially visceral belly fat, can put [place] at increased risk for metabolic syndrome, type 2 diabetes, heart disease, and even death. It is recommended to combine diet and exercise (cardiovascular and resistance training) to improve your body composition. Seek guidance from your physician and exercise physiologist before implementing an exercise routine.  Exercise Action Plan Clinical staff conducted group or individual video  education with verbal and written material and guidebook.  Patient learns the recommended strategies to achieve and enjoy long-term exercise adherence, including variety, self-motivation, self-efficacy, and positive decision making. Benefits of exercise include fitness, good health, weight management, more energy, better sleep, less stress, and overall well-being.  Medical   Heart Disease Risk Reduction Clinical staff conducted group or individual video education with verbal and written material and guidebook.  Patient learns our heart is our most vital organ as it circulates oxygen, nutrients, white blood cells, and hormones throughout the entire body, and carries waste away. Data supports a plant-based eating plan like the Pritikin Program for its effectiveness in slowing  progression of and reversing heart disease. The video provides a number of recommendations to address heart disease.   Metabolic Syndrome and Belly Fat  Clinical staff conducted group or individual video education with verbal and written material and guidebook.  Patient learns what metabolic syndrome is, how it leads to heart disease, and how one can reverse it and keep it from coming back. You have metabolic syndrome if you have 3 of the following 5 criteria: abdominal obesity, high blood pressure, high triglycerides, low HDL cholesterol, and high blood sugar.  Hypertension and Heart Disease Clinical staff conducted group or individual video education with verbal and written material and guidebook.  Patient learns that high blood pressure, or hypertension, is very common in the [place]. Hypertension is largely due to excessive salt intake, but other important risk factors include being overweight, physical inactivity, drinking too much alcohol, smoking, and not eating enough potassium from fruits and vegetables. High blood pressure is a leading risk factor for heart attack, stroke, congestive heart failure, dementia, kidney failure, and premature death. Long-term effects of excessive salt intake include stiffening of the arteries and thickening of heart muscle and organ damage. Recommendations include ways to reduce hypertension and the risk of heart disease.  Diseases of Our Time - Focusing on Diabetes Clinical staff conducted group or individual video education with verbal and written material and guidebook.  Patient learns why the best way to stop diseases of our time is prevention, through food and other lifestyle changes. Medicine (such as prescription pills and surgeries) is often only a Band-Aid on the problem, not a long-term solution. Most common diseases of our time include obesity, type 2 diabetes, hypertension, heart disease, and cancer. The Pritikin Program is recommended and has been  proven to help reduce, reverse, and/or prevent the damaging effects of metabolic syndrome.  Nutrition   Overview of the Pritikin Eating Plan  Clinical staff conducted group or individual video education with verbal and written material and guidebook.  Patient learns about the [place] for disease risk reduction. The [place] emphasizes a wide variety of unrefined, minimally-processed carbohydrates, like fruits, vegetables, whole grains, and legumes. Go, Caution, and Stop food choices are explained. Plant-based and lean animal proteins are emphasized. Rationale provided for low sodium intake for blood pressure control, low added sugars for blood sugar stabilization, and low added fats and oils for coronary artery disease risk reduction and weight management.  Calorie Density  Clinical staff conducted group or individual video education with verbal and written material and guidebook.  Patient learns about calorie density and how it impacts the Pritikin Eating Plan. Knowing the characteristics of the food you choose will help you decide whether those foods will lead to weight gain or weight loss, and whether you want to consume more or less of them. Weight  loss is usually a side effect of the Pritikin Eating Plan because of its focus on low calorie-dense foods.  Label Reading  Clinical staff conducted group or individual video education with verbal and written material and guidebook.  Patient learns about the Pritikin recommended label reading guidelines and corresponding recommendations regarding calorie density, added sugars, sodium content, and whole grains.  Dining Out - Part 1  Clinical staff conducted group or individual video education with verbal and written material and guidebook.  Patient learns that restaurant meals can be sabotaging because they can be so high in calories, fat, sodium, and/or sugar. Patient learns recommended strategies on how to positively address  this and avoid unhealthy pitfalls.  Facts on Fats  Clinical staff conducted group or individual video education with verbal and written material and guidebook.  Patient learns that lifestyle modifications can be just as effective, if not more so, as many medications for lowering your risk of heart disease. A Pritikin lifestyle can help to reduce your risk of inflammation and atherosclerosis (cholesterol build-up, or plaque, in the artery walls). Lifestyle interventions such as dietary choices and physical activity address the cause of atherosclerosis. A review of the types of fats and their impact on blood cholesterol levels, along with dietary recommendations to reduce fat intake is also included.  Nutrition Action Plan  Clinical staff conducted group or individual video education with verbal and written material and guidebook.  Patient learns how to incorporate Pritikin recommendations into their lifestyle. Recommendations include planning and keeping personal health goals in mind as an important part of their success.  Healthy Mind-Set    Healthy Minds, Bodies, Hearts  Clinical staff conducted group or individual video education with verbal and written material and guidebook.  Patient learns how to identify when they are stressed. Video will discuss the impact of that stress, as well as the many benefits of stress management. Patient will also be introduced to stress management techniques. The way we think, act, and feel has an impact on our hearts.  How Our Thoughts Can Heal Our Hearts  Clinical staff conducted group or individual video education with verbal and written material and guidebook.  Patient learns that negative thoughts can cause depression and anxiety. This can result in negative lifestyle behavior and serious health problems. Cognitive behavioral therapy is an effective method to help control our thoughts in order to change and improve our emotional outlook.  Additional  Videos:  Exercise    Improving Performance  Clinical staff conducted group or individual video education with verbal and written material and guidebook.  Patient learns to use a non-linear approach by alternating intensity levels and lengths of time spent exercising to help burn more calories and lose more body fat. Cardiovascular exercise helps improve heart health, metabolism, hormonal balance, blood sugar control, and recovery from fatigue. Resistance training improves strength, endurance, balance, coordination, reaction time, metabolism, and muscle mass. Flexibility exercise improves circulation, posture, and balance. Seek guidance from your physician and exercise physiologist before implementing an exercise routine and learn your capabilities and proper form for all exercise.  Introduction to Yoga  Clinical staff conducted group or individual video education with verbal and written material and guidebook.  Patient learns about yoga, a discipline of the coming together of mind, breath, and body. The benefits of yoga include improved flexibility, improved range of motion, better posture and core strength, increased lung function, weight loss, and positive self-image. Yoga's heart health benefits include lowered blood pressure, healthier heart rate, decreased  cholesterol and triglyceride levels, improved immune function, and reduced stress. Seek guidance from your physician and exercise physiologist before implementing an exercise routine and learn your capabilities and proper form for all exercise.  Medical   Aging: Enhancing Your Quality of Life  Clinical staff conducted group or individual video education with verbal and written material and guidebook.  Patient learns key strategies and recommendations to stay in good physical health and enhance quality of life, such as prevention strategies, having an advocate, securing a [place], and keeping a list of medications  and system for tracking them. It also discusses how to avoid risk for bone loss.  Biology of Weight Control  Clinical staff conducted group or individual video education with verbal and written material and guidebook.  Patient learns that weight gain occurs because we consume more calories than we burn (eating more, moving less). Even if your body weight is normal, you may have higher ratios of fat compared to muscle mass. Too much body fat puts you at increased risk for cardiovascular disease, heart attack, stroke, type 2 diabetes, and obesity-related cancers. In addition to exercise, following the [place] can help reduce your risk.  Decoding Lab Results  Clinical staff conducted group or individual video education with verbal and written material and guidebook.  Patient learns that lab test reflects one measurement whose values change over time and are influenced by many factors, including medication, stress, sleep, exercise, food, hydration, pre-existing medical conditions, and more. It is recommended to use the knowledge from this video to become more involved with your lab results and evaluate your numbers to speak with your doctor.   Diseases of Our Time - Overview  Clinical staff conducted group or individual video education with verbal and written material and guidebook.  Patient learns that according to the CDC, 50% to 70% of chronic diseases (such as obesity, type 2 diabetes, elevated lipids, hypertension, and heart disease) are avoidable through lifestyle improvements including healthier food choices, listening to satiety cues, and increased physical activity.  Sleep Disorders Clinical staff conducted group or individual video education with verbal and written material and guidebook.  Patient learns how good quality and duration of sleep are important to overall health and well-being. Patient also learns about sleep disorders and how they impact health along with  recommendations to address them, including discussing with a physician.  Nutrition  Dining Out - Part 2 Clinical staff conducted group or individual video education with verbal and written material and guidebook.  Patient learns how to plan ahead and communicate in order to maximize their dining experience in a healthy and nutritious manner. Included are recommended food choices based on the type of restaurant the patient is visiting.   Fueling a Best boy conducted group or individual video education with verbal and written material and guidebook.  There is a strong connection between our food choices and our health. Diseases like obesity and type 2 diabetes are very prevalent and are in large-part due to lifestyle choices. The Pritikin Eating Plan provides plenty of food and hunger-curbing satisfaction. It is easy to follow, affordable, and helps reduce health risks.  Menu Workshop  Clinical staff conducted group or individual video education with verbal and written material and guidebook.  Patient learns that restaurant meals can sabotage health goals because they are often packed with calories, fat, sodium, and sugar. Recommendations include strategies to plan ahead and to communicate with the manager, chef,  or server to help order a healthier meal.  Planning Your Eating Strategy  Clinical staff conducted group or individual video education with verbal and written material and guidebook.  Patient learns about the [place] and its benefit of reducing the risk of disease. The [place] does not focus on calories. Instead, it emphasizes high-quality, nutrient-rich foods. By knowing the characteristics of the foods, we choose, we can determine their calorie density and make informed decisions.  Targeting Your Nutrition Priorities  Clinical staff conducted group or individual video education with verbal and written material and guidebook.  Patient learns  that lifestyle habits have a tremendous impact on disease risk and progression. This video provides eating and physical activity recommendations based on your personal health goals, such as reducing LDL cholesterol, losing weight, preventing or controlling type 2 diabetes, and reducing high blood pressure.  Vitamins and Minerals  Clinical staff conducted group or individual video education with verbal and written material and guidebook.  Patient learns different ways to obtain key vitamins and minerals, including through a recommended healthy diet. It is important to discuss all supplements you take with your doctor.   Healthy Mind-Set    Smoking Cessation  Clinical staff conducted group or individual video education with verbal and written material and guidebook.  Patient learns that cigarette smoking and tobacco addiction pose a serious health risk which affects millions of people. Stopping smoking will significantly reduce the risk of heart disease, lung disease, and many forms of cancer. Recommended strategies for quitting are covered, including working with your doctor to develop a successful plan.  Culinary   Becoming a Financial trader conducted group or individual video education with verbal and written material and guidebook.  Patient learns that cooking at home can be healthy, cost-effective, quick, and puts them in control. Keys to cooking healthy recipes will include looking at your recipe, assessing your equipment needs, planning ahead, making it simple, choosing cost-effective seasonal ingredients, and limiting the use of added fats, salts, and sugars.  Cooking - Breakfast and Snacks  Clinical staff conducted group or individual video education with verbal and written material and guidebook.  Patient learns how important breakfast is to satiety and nutrition through the entire day. Recommendations include key foods to eat during breakfast to help stabilize blood sugar  levels and to prevent overeating at meals later in the day. Planning ahead is also a key component.  Cooking - Human resources officer conducted group or individual video education with verbal and written material and guidebook.  Patient learns eating strategies to improve overall health, including an approach to cook more at home. Recommendations include thinking of animal protein as a side on your plate rather than center stage and focusing instead on lower calorie dense options like vegetables, fruits, whole grains, and plant-based proteins, such as beans. Making sauces in large quantities to freeze for later and leaving the skin on your vegetables are also recommended to maximize your experience.  Cooking - Healthy Salads and Dressing Clinical staff conducted group or individual video education with verbal and written material and guidebook.  Patient learns that vegetables, fruits, whole grains, and legumes are the foundations of the [place]. Recommendations include how to incorporate each of these in flavorful and healthy salads, and how to create homemade salad dressings. Proper handling of ingredients is also covered. Cooking - Soups and Desserts  Cooking - Soups and Desserts Clinical staff conducted group or individual  video education with verbal and written material and guidebook.  Patient learns that Pritikin soups and desserts make for easy, nutritious, and delicious snacks and meal components that are low in sodium, fat, sugar, and calorie density, while high in vitamins, minerals, and filling fiber. Recommendations include simple and healthy ideas for soups and desserts.   Overview     The Pritikin Solution Program Overview Clinical staff conducted group or individual video education with verbal and written material and guidebook.  Patient learns that the results of the [place] Program have been documented in more than 100 articles published in peer-reviewed  journals, and the benefits include reducing risk factors for (and, in some cases, even reversing) high cholesterol, high blood pressure, type 2 diabetes, obesity, and more! An overview of the three key pillars of the Pritikin Program will be covered: eating well, doing regular exercise, and having a healthy mind-set.  WORKSHOPS  Exercise: Exercise Basics: Building Your Action Plan Clinical staff led group instruction and group discussion with PowerPoint presentation and patient guidebook. To enhance the learning environment the use of posters, models and videos may be added. At the conclusion of this workshop, patients will comprehend the difference between physical activity and exercise, as well as the benefits of incorporating both, into their routine. Patients will understand the FITT (Frequency, Intensity, Time, and Type) principle and how to use it to build an exercise action plan. In addition, safety concerns and other considerations for exercise and cardiac rehab will be addressed by the presenter. The purpose of this lesson is to promote a comprehensive and effective weekly exercise routine in order to improve patients' overall level of fitness.   Managing Heart Disease: Your Path to a Healthier Heart Clinical staff led group instruction and group discussion with PowerPoint presentation and patient guidebook. To enhance the learning environment the use of posters, models and videos may be added.At the conclusion of this workshop, patients will understand the anatomy and physiology of the heart. Additionally, they will understand how Pritikin's three pillars impact the risk factors, the progression, and the management of heart disease.  The purpose of this lesson is to provide a high-level overview of the heart, heart disease, and how the Pritikin lifestyle positively impacts risk factors.  Exercise Biomechanics Clinical staff led group instruction and group discussion with PowerPoint  presentation and patient guidebook. To enhance the learning environment the use of posters, models and videos may be added. Patients will learn how the structural parts of their bodies function and how these functions impact their daily activities, movement, and exercise. Patients will learn how to promote a neutral spine, learn how to manage pain, and identify ways to improve their physical movement in order to promote healthy living. The purpose of this lesson is to expose patients to common physical limitations that impact physical activity. Participants will learn practical ways to adapt and manage aches and pains, and to minimize their effect on regular exercise. Patients will learn how to maintain good posture while sitting, walking, and lifting.  Balance Training and Fall Prevention  Clinical staff led group instruction and group discussion with PowerPoint presentation and patient guidebook. To enhance the learning environment the use of posters, models and videos may be added. At the conclusion of this workshop, patients will understand the importance of their sensorimotor skills (vision, proprioception, and the vestibular system) in maintaining their ability to balance as they age. Patients will apply a variety of balancing exercises that are appropriate for their current level of  function. Patients will understand the common causes for poor balance, possible solutions to these problems, and ways to modify their physical environment in order to minimize their fall risk. The purpose of this lesson is to teach patients about the importance of maintaining balance as they age and ways to minimize their risk of falling.  WORKSHOPS   Nutrition:  Fueling a Scientist, research (physical sciences) led group instruction and group discussion with PowerPoint presentation and patient guidebook. To enhance the learning environment the use of posters, models and videos may be added. Patients will review the  foundational principles of the [place] and understand what constitutes a serving size in each of the food groups. Patients will also learn Pritikin-friendly foods that are better choices when away from home and review make-ahead meal and snack options. Calorie density will be reviewed and applied to three nutrition priorities: weight maintenance, weight loss, and weight gain. The purpose of this lesson is to reinforce (in a group setting) the key concepts around what patients are recommended to eat and how to apply these guidelines when away from home by planning and selecting Pritikin-friendly options. Patients will understand how calorie density may be adjusted for different weight management goals.  Mindful Eating  Clinical staff led group instruction and group discussion with PowerPoint presentation and patient guidebook. To enhance the learning environment the use of posters, models and videos may be added. Patients will briefly review the concepts of the [place] and the importance of low-calorie dense foods. The concept of mindful eating will be introduced as well as the importance of paying attention to internal hunger signals. Triggers for non-hunger eating and techniques for dealing with triggers will be explored. The purpose of this lesson is to provide patients with the opportunity to review the basic principles of the [place], discuss the value of eating mindfully and how to measure internal cues of hunger and fullness using the Hunger Scale. Patients will also discuss reasons for non-hunger eating and learn strategies to use for controlling emotional eating.  Targeting Your Nutrition Priorities Clinical staff led group instruction and group discussion with PowerPoint presentation and patient guidebook. To enhance the learning environment the use of posters, models and videos may be added. Patients will learn how to determine their genetic susceptibility to  disease by reviewing their family history. Patients will gain insight into the importance of diet as part of an overall healthy lifestyle in mitigating the impact of genetics and other environmental insults. The purpose of this lesson is to provide patients with the opportunity to assess their personal nutrition priorities by looking at their family history, their own health history and current risk factors. Patients will also be able to discuss ways of prioritizing and modifying the [place] for their highest risk areas  Menu  Clinical staff led group instruction and group discussion with PowerPoint presentation and patient guidebook. To enhance the learning environment the use of posters, models and videos may be added. Using menus brought in from [employer], or printed from [employer], patients will apply the [place] dining out guidelines that were presented in the [employer] video. Patients will also be able to practice these guidelines in a variety of provided scenarios. The purpose of this lesson is to provide patients with the opportunity to practice hands-on learning of the [place] with actual menus and practice scenarios.  Label Reading Clinical staff led group instruction and group discussion with  PowerPoint presentation and patient guidebook. To enhance the learning environment the use of posters, models and videos may be added. Patients will review and discuss the Pritikin label reading guidelines presented in Pritikin's Label Reading Educational series video. Using fool labels brought in from local grocery stores and markets, patients will apply the label reading guidelines and determine if the packaged food meet the Pritikin guidelines. The purpose of this lesson is to provide patients with the opportunity to review, discuss, and practice hands-on learning of the Pritikin Label Reading guidelines with actual packaged food  labels. [place] Workshops are designed to teach patients ways to prepare quick, simple, and affordable recipes at home. The importance of nutrition's role in chronic disease risk reduction is reflected in its emphasis in the overall Pritikin program. By learning how to prepare essential core Pritikin Eating Plan recipes, patients will increase control over what they eat; be able to customize the flavor of foods without the use of added salt, sugar, or fat; and improve the quality of the food they consume. By learning a set of core recipes which are easily assembled, quickly prepared, and affordable, patients are more likely to prepare more healthy foods at home. These workshops focus on convenient breakfasts, simple entres, side dishes, and desserts which can be prepared with minimal effort and are consistent with nutrition recommendations for cardiovascular risk reduction. 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 The chef or RD has a clear understanding of the importance of minimizing - if not completely eliminating - added fat, sugar, and sodium in recipes. Throughout the series of [place] Workshop sessions, patients will learn about healthy ingredients and efficient methods of cooking to build confidence in their capability to prepare    Cooking School weekly topics:  Adding Flavor- Sodium-Free  Fast and Healthy Breakfasts  Powerhouse Plant-Based Proteins  Satisfying Salads and Dressings  Simple Sides and Sauces  International Cuisine-Spotlight on the [place] Zones  Delicious Desserts  Savory Soups  Efficiency Cooking - Meals in a Snap  Tasty Appetizers and Snacks  Comforting Weekend Breakfasts  One-Pot Wonders   Fast Evening Meals  Easy [place]over (Psychosocial): New Thoughts, New  Behaviors Clinical staff led group instruction and group discussion with PowerPoint presentation and patient guidebook. To enhance the learning environment the use of posters, models and videos may be added. Patients will learn and practice techniques for developing effective health and lifestyle goals. Patients will be able to effectively apply the goal setting process learned to develop at least one new personal goal.  The purpose of this lesson is to expose patients to a new skill set of behavior modification techniques such as techniques setting SMART goals, overcoming barriers, and achieving new thoughts and new behaviors.  Managing Moods and Relationships Clinical staff led group instruction and group discussion with PowerPoint presentation and patient guidebook. To enhance the learning environment the use of posters, models and videos may be added. Patients will learn how emotional and chronic stress factors can impact their health and relationships. They will learn healthy ways to manage their moods and utilize positive coping mechanisms. In addition, [facility] patients will learn ways to improve communication skills. The purpose of this lesson is to expose patients to ways of understanding how one's mood and health are intimately connected. Developing a healthy outlook can help build  positive relationships and connections with others. Patients will understand the importance of utilizing effective communication skills that include actively listening and being heard. They will learn and understand the importance of the "4 Cs" and especially Connections in fostering of a Healthy Mind-Set.  Healthy Sleep for a Healthy Heart Clinical staff led group instruction and group discussion with PowerPoint presentation and patient guidebook. To enhance the learning environment the use of posters, models and videos may be added. At the conclusion of this workshop, patients will be able to demonstrate knowledge of the  importance of sleep to overall health, well-being, and quality of life. They will understand the symptoms of, and treatments for, common sleep disorders. Patients will also be able to identify daytime and nighttime behaviors which impact sleep, and they will be able to apply these tools to help manage sleep-related challenges. The purpose of this lesson is to provide patients with a general overview of sleep and outline the importance of quality sleep. Patients will learn about a few of the most common sleep disorders. Patients will also be introduced to the concept of "sleep hygiene," and discover ways to self-manage certain sleeping problems through simple daily behavior changes. Finally, the workshop will motivate patients by clarifying the links between quality sleep and their goals of heart-healthy living.   Recognizing and Reducing Stress Clinical staff led group instruction and group discussion with PowerPoint presentation and patient guidebook. To enhance the learning environment the use of posters, models and videos may be added. At the conclusion of this workshop, patients will be able to understand the types of stress reactions, differentiate between acute and chronic stress, and recognize the impact that chronic stress has on their health. They will also be able to apply different coping mechanisms, such as reframing negative self-talk. Patients will have the opportunity to practice a variety of stress management techniques, such as deep abdominal breathing, progressive muscle relaxation, and/or guided imagery.  The purpose of this lesson is to educate patients on the role of stress in their lives and to provide healthy techniques for coping with it.  Learning Barriers/Preferences:  Learning Barriers/Preferences - 12/07/21 1027       Learning Barriers/Preferences   Learning Barriers None    Learning Preferences Pictoral;Video             Education Topics:  Knowledge Questionnaire  Score:  Knowledge Questionnaire Score - 12/07/21 1044       Knowledge Questionnaire Score   Pre Score 23/24             Core Components/Risk Factors/Patient Goals at Admission:  Personal Goals and Risk Factors at Admission - 12/07/21 1044       Core Components/Risk Factors/Patient Goals on Admission   Hypertension Yes    Intervention Provide education on lifestyle modifcations including regular physical activity/exercise, weight management, moderate sodium restriction and increased consumption of fresh fruit, vegetables, and low fat dairy, alcohol moderation, and smoking cessation.;Monitor prescription use compliance.    Expected Outcomes Short Term: Continued assessment and intervention until BP is < 140/72m HG in hypertensive participants. < 130/876mHG in hypertensive participants with diabetes, heart failure or chronic kidney disease.;Long Term: Maintenance of blood pressure at goal levels.    Lipids Yes    Intervention Provide education and support for participant on nutrition & aerobic/resistive exercise along with prescribed medications to achieve LDL '70mg'$ , HDL >'40mg'$ .    Expected Outcomes Short Term: Participant states understanding of desired cholesterol values and is compliant with medications prescribed.  Participant is following exercise prescription and nutrition guidelines.;Long Term: Cholesterol controlled with medications as prescribed, with individualized exercise RX and with personalized nutrition plan. Value goals: LDL < '70mg'$ , HDL > 40 mg.             Core Components/Risk Factors/Patient Goals Review:   Goals and Risk Factor Review     Row Name 12/13/21 1109 01/04/22 1133           Core Components/Risk Factors/Patient Goals Review   Personal Goals Review Lipids;Hypertension Lipids;Hypertension      Review Patient started intensive cardiac rehab today, 12/13/21, he tolerated exercise well, vital signs stable, denies pain. Linna Hoff has been doing well with exercise  at intensive cardiac rehab and has not reported any complaints or chest pain or discomfort. Vital signs and weights have been stable      Expected Outcomes Patient will maintain healhty lifestyle as he is provided the knowledge and tools neccessary to complete goals; he will increase exercise stamina druing the intensive cardiac rehab program. Patient will maintain healhty lifestyle as he is provided the knowledge and tools neccessary to complete goals; he will increase exercise stamina druing the intensive cardiac rehab program.               Core Components/Risk Factors/Patient Goals at Discharge (Final Review):   Goals and Risk Factor Review - 01/04/22 1133       Core Components/Risk Factors/Patient Goals Review   Personal Goals Review Lipids;Hypertension    Review Linna Hoff has been doing well with exercise at intensive cardiac rehab and has not reported any complaints or chest pain or discomfort. Vital signs and weights have been stable    Expected Outcomes Patient will maintain healhty lifestyle as he is provided the knowledge and tools neccessary to complete goals; he will increase exercise stamina druing the intensive cardiac rehab program.             ITP Comments:  ITP Comments     Row Name 12/07/21 0912 12/13/21 1426 01/04/22 1130       ITP Comments Medical Director- Dr. Fransico Him, MD. Introduction to [facility] Education Program/ Intensive Cardiac Rehab. Initial [facility] Orientation Packet Reviewed with the Patient. 30 Day ITP Review. Dan started intensive cardiac rehab on 12/13/21. Dan did well with exercise. 30 Day ITP Review. Linna Hoff has good attendance and participation in intensive cardiac rehab.              Comments: See ITP comments

## 2022-01-05 ENCOUNTER — Encounter (HOSPITAL_COMMUNITY)
Admission: RE | Admit: 2022-01-05 | Discharge: 2022-01-05 | Disposition: A | Discharge status: Home / Self Care | Payer: Medicare Other | Source: Ambulatory Visit | Attending: Cardiology | Admitting: Cardiology

## 2022-01-05 DIAGNOSIS — Z955 Presence of coronary angioplasty implant and graft: Secondary | ICD-10-CM

## 2022-01-05 DIAGNOSIS — I214 Non-ST elevation (NSTEMI) myocardial infarction: Secondary | ICD-10-CM

## 2022-01-07 ENCOUNTER — Encounter (HOSPITAL_COMMUNITY)
Admission: RE | Admit: 2022-01-07 | Discharge: 2022-01-07 | Disposition: A | Discharge status: Home / Self Care | Payer: Medicare Other | Source: Ambulatory Visit | Attending: Cardiology | Admitting: Cardiology

## 2022-01-07 DIAGNOSIS — Z955 Presence of coronary angioplasty implant and graft: Secondary | ICD-10-CM

## 2022-01-07 DIAGNOSIS — I214 Non-ST elevation (NSTEMI) myocardial infarction: Secondary | ICD-10-CM | POA: Diagnosis not present

## 2022-01-10 ENCOUNTER — Encounter (HOSPITAL_COMMUNITY)
Admission: RE | Admit: 2022-01-10 | Discharge: 2022-01-10 | Disposition: A | Discharge status: Home / Self Care | Payer: Medicare Other | Source: Ambulatory Visit | Attending: Cardiology | Admitting: Cardiology

## 2022-01-10 DIAGNOSIS — Z955 Presence of coronary angioplasty implant and graft: Secondary | ICD-10-CM | POA: Diagnosis not present

## 2022-01-10 DIAGNOSIS — I214 Non-ST elevation (NSTEMI) myocardial infarction: Secondary | ICD-10-CM | POA: Diagnosis not present

## 2022-01-12 ENCOUNTER — Encounter (HOSPITAL_COMMUNITY)
Admission: RE | Admit: 2022-01-12 | Discharge: 2022-01-12 | Disposition: A | Discharge status: Home / Self Care | Payer: Medicare Other | Source: Ambulatory Visit | Attending: Cardiology | Admitting: Cardiology

## 2022-01-12 DIAGNOSIS — Z955 Presence of coronary angioplasty implant and graft: Secondary | ICD-10-CM

## 2022-01-12 DIAGNOSIS — I214 Non-ST elevation (NSTEMI) myocardial infarction: Secondary | ICD-10-CM | POA: Diagnosis not present

## 2022-01-12 NOTE — Telephone Encounter (Signed)
It looks like Rosaria Ferries, PA-C, is actually the one who was working in the hospital with Dr. Gardiner Rhyme that day and ordered the monitor so I am going to route this to her.  Thanks! Zyanne Schumm

## 2022-01-13 ENCOUNTER — Ambulatory Visit: Payer: Medicare Other | Admitting: Student

## 2022-01-14 ENCOUNTER — Encounter (HOSPITAL_COMMUNITY)
Admission: RE | Admit: 2022-01-14 | Discharge: 2022-01-14 | Disposition: A | Discharge status: Home / Self Care | Payer: Medicare Other | Source: Ambulatory Visit | Attending: Cardiology | Admitting: Cardiology

## 2022-01-14 DIAGNOSIS — Z955 Presence of coronary angioplasty implant and graft: Secondary | ICD-10-CM | POA: Diagnosis not present

## 2022-01-14 DIAGNOSIS — I214 Non-ST elevation (NSTEMI) myocardial infarction: Secondary | ICD-10-CM | POA: Insufficient documentation

## 2022-01-19 ENCOUNTER — Encounter (HOSPITAL_COMMUNITY)
Admission: RE | Admit: 2022-01-19 | Discharge: 2022-01-19 | Disposition: A | Discharge status: Home / Self Care | Payer: Medicare Other | Source: Ambulatory Visit | Attending: Cardiology | Admitting: Cardiology

## 2022-01-19 ENCOUNTER — Encounter: Payer: Self-pay | Admitting: Internal Medicine

## 2022-01-19 DIAGNOSIS — Z955 Presence of coronary angioplasty implant and graft: Secondary | ICD-10-CM | POA: Diagnosis not present

## 2022-01-19 DIAGNOSIS — I214 Non-ST elevation (NSTEMI) myocardial infarction: Secondary | ICD-10-CM

## 2022-01-21 ENCOUNTER — Encounter (HOSPITAL_COMMUNITY)
Admission: RE | Admit: 2022-01-21 | Discharge: 2022-01-21 | Disposition: A | Discharge status: Home / Self Care | Payer: Medicare Other | Source: Ambulatory Visit | Attending: Cardiology | Admitting: Cardiology

## 2022-01-21 DIAGNOSIS — I214 Non-ST elevation (NSTEMI) myocardial infarction: Secondary | ICD-10-CM

## 2022-01-21 DIAGNOSIS — Z955 Presence of coronary angioplasty implant and graft: Secondary | ICD-10-CM | POA: Diagnosis not present

## 2022-01-24 ENCOUNTER — Encounter (HOSPITAL_COMMUNITY)
Admission: RE | Admit: 2022-01-24 | Discharge: 2022-01-24 | Disposition: A | Discharge status: Home / Self Care | Payer: Medicare Other | Source: Ambulatory Visit | Attending: Cardiology | Admitting: Cardiology

## 2022-01-24 DIAGNOSIS — I214 Non-ST elevation (NSTEMI) myocardial infarction: Secondary | ICD-10-CM

## 2022-01-24 DIAGNOSIS — Z955 Presence of coronary angioplasty implant and graft: Secondary | ICD-10-CM | POA: Diagnosis not present

## 2022-01-26 ENCOUNTER — Encounter (HOSPITAL_COMMUNITY)
Admission: RE | Admit: 2022-01-26 | Discharge: 2022-01-26 | Disposition: A | Discharge status: Home / Self Care | Payer: Medicare Other | Source: Ambulatory Visit | Attending: Cardiology | Admitting: Cardiology

## 2022-01-26 ENCOUNTER — Encounter: Payer: Self-pay | Admitting: *Deleted

## 2022-01-26 DIAGNOSIS — I214 Non-ST elevation (NSTEMI) myocardial infarction: Secondary | ICD-10-CM | POA: Diagnosis not present

## 2022-01-26 DIAGNOSIS — Z955 Presence of coronary angioplasty implant and graft: Secondary | ICD-10-CM

## 2022-01-28 ENCOUNTER — Encounter (HOSPITAL_COMMUNITY)
Admission: RE | Admit: 2022-01-28 | Discharge: 2022-01-28 | Disposition: A | Discharge status: Home / Self Care | Payer: Medicare Other | Source: Ambulatory Visit | Attending: Cardiology | Admitting: Cardiology

## 2022-01-28 DIAGNOSIS — I214 Non-ST elevation (NSTEMI) myocardial infarction: Secondary | ICD-10-CM

## 2022-01-28 DIAGNOSIS — Z955 Presence of coronary angioplasty implant and graft: Secondary | ICD-10-CM | POA: Diagnosis not present

## 2022-01-31 ENCOUNTER — Encounter (HOSPITAL_COMMUNITY)
Admission: RE | Admit: 2022-01-31 | Discharge: 2022-01-31 | Disposition: A | Discharge status: Home / Self Care | Payer: Medicare Other | Source: Ambulatory Visit | Attending: Cardiology | Admitting: Cardiology

## 2022-01-31 DIAGNOSIS — I214 Non-ST elevation (NSTEMI) myocardial infarction: Secondary | ICD-10-CM

## 2022-01-31 DIAGNOSIS — Z955 Presence of coronary angioplasty implant and graft: Secondary | ICD-10-CM | POA: Diagnosis not present

## 2022-02-01 NOTE — Progress Notes (Signed)
Cardiac Individual Treatment Plan  Patient Details  Name: Andre Martinez MRN: [id number] Date of Birth: Sep 13, 1955 Referring Provider:   Flowsheet Row INTENSIVE CARDIAC REHAB ORIENT from 12/07/2021 in [place]  Referring Provider Donato Heinz, MD       Initial Encounter Date:  [place] from 12/07/2021 in [place]  Date 12/07/21       Visit Diagnosis: 09/30/21 NSTEMI  09/30/21 DES RCA  Patient's Home Medications on Admission:  Current Outpatient Medications:    amLODipine (NORVASC) 2.5 MG tablet, Take 1 tablet (2.5 mg total) by mouth daily., Disp: 30 tablet, Rfl: 6   aspirin 81 MG chewable tablet, Chew 1 tablet (81 mg total) by mouth daily., Disp: , Rfl:    atorvastatin (LIPITOR) 40 MG tablet, Take 1 tablet (40 mg total) by mouth daily., Disp: 90 tablet, Rfl: 3   diclofenac Sodium (VOLTAREN) 1 % GEL, Apply 1 Application topically 4 (four) times daily as needed (arthritis pain (knee))., Disp: , Rfl:    metoprolol succinate (TOPROL-XL) 50 MG 24 hr tablet, TAKE 1 TABLET BY MOUTH DAILY  WITH OR IMMEDIATELY FOLLOWING A  MEAL, Disp: 90 tablet, Rfl: 1   Multiple Vitamin (MULTIVITAMIN) tablet, Take 1 tablet by mouth in the morning. Centrum Silver, Disp: , Rfl:    nitroGLYCERIN (NITROSTAT) 0.4 MG SL tablet, Place 1 tablet (0.4 mg total) under the tongue every 5 (five) minutes x 3 doses as needed for chest pain., Disp: 25 tablet, Rfl: 12   pantoprazole (PROTONIX) 40 MG tablet, Take 1 tablet (40 mg total) by mouth daily., Disp: 90 tablet, Rfl: 3   Polyethylene Glycol 400 (BLINK TEARS) 0.25 % SOLN, Place 1-2 drops into both eyes 3 (three) times daily as needed (dry/irritated eyes.)., Disp: , Rfl:    ticagrelor (BRILINTA) 90 MG TABS tablet, Take 1 tablet (90 mg total) by mouth 2 (two) times daily., Disp: 180 tablet, Rfl: 1  Past Medical History: Past Medical History:  Diagnosis  Date   Allergy    Arthritis    Coronary artery disease    GERD (gastroesophageal reflux disease)    Hemorrhoids    Hyperlipidemia    Hypertension    Sinusitis 12/94 3/95    Tobacco Use: Social History   Tobacco Use  Smoking Status Former   Types: Cigars  Smokeless Tobacco Never    Labs: Review Flowsheet  More data exists      Latest Ref Rng & Units 03/06/2018 04/10/2019 04/13/2020 04/15/2021 10/01/2021  Labs for ITP Cardiac and Pulmonary Rehab  Cholestrol 0 - 200 mg/dL 203  191  198  207  107   LDL (calc) 0 - 99 mg/dL 109  116  128  134  51   HDL-C >40 mg/dL 55  60  53  53  35   Trlycerides <150 mg/dL 193  84  97  113  104   Hemoglobin A1c 4.8 - 5.6 % - - - - 5.8     Capillary Blood Glucose: No results found for: "GLUCAP"   Exercise Target Goals: Exercise Program Goal: Individual exercise prescription set using results from initial 6 min walk test and THRR while considering  patient's activity barriers and safety.   Exercise Prescription Goal: Initial exercise prescription builds to 30-45 minutes a day of aerobic activity, 2-3 days per week.  Home exercise guidelines will be given to patient during program as part of exercise prescription that the  participant will acknowledge.  Activity Barriers & Risk Stratification:  Activity Barriers & Cardiac Risk Stratification - 12/07/21 0947       Activity Barriers & Cardiac Risk Stratification   Activity Barriers Arthritis;Left Knee Replacement;Other (comment)    Comments Needs right knee replacement, wears brace on right knee, gel and hydrocortisone injections.    Cardiac Risk Stratification Moderate             6 Minute Walk:  6 Minute Walk     Row Name 12/07/21 0935         6 Minute Walk   Phase Initial     Distance 1917 feet     Walk Time 6 minutes     # of Rest Breaks 0     MPH 3.63     METS 4.5     RPE 12     Perceived Dyspnea  0     VO2 Peak 15.76     Symptoms No     Resting HR 69 bpm      Resting BP 124/72     Resting Oxygen Saturation  99 %     Exercise Oxygen Saturation  during 6 min walk 100 %     Max Ex. HR 96 bpm     Max Ex. BP 140/80     2 Minute Post BP 112/64              Oxygen Initial Assessment:   Oxygen Re-Evaluation:   Oxygen Discharge (Final Oxygen Re-Evaluation):   Initial Exercise Prescription:  Initial Exercise Prescription - 12/07/21 1000       Date of Initial Exercise RX and Referring Provider   Date 12/07/21    Referring Provider Donato Heinz, MD    Expected Discharge Date 02/11/22      Bike   Level 1.6    Minutes 15    METs 4.56      NuStep   Level 4    SPM 85    Minutes 15    METs 3.5      Prescription Details   Frequency (times per week) 3    Duration Progress to 30 minutes of continuous aerobic without signs/symptoms of physical distress      Intensity   THRR 40-80% of Max Heartrate 61-122    Ratings of Perceived Exertion 11-13    Perceived Dyspnea 0-4      Progression   Progression Continue to progress workloads to maintain intensity without signs/symptoms of physical distress.      Resistance Training   Training Prescription Yes    Weight 5 lbs    Reps 10-15             Perform Capillary Blood Glucose checks as needed.  Exercise Prescription Changes:   Exercise Prescription Changes     Row Name 12/13/21 1022 12/20/21 1031 01/03/22 1029 01/19/22 1035 01/31/22 1024     Response to Exercise   Blood Pressure (Admit) 118/74 138/78 112/62 104/62 104/62   Blood Pressure (Exercise) 148/82 128/80 158/82 148/70 130/70   Blood Pressure (Exit) 108/70 104/70 1'02/60 96/60 98/66 '$   Heart Rate (Admit) 66 bpm 74 bpm 66 bpm 75 bpm 71 bpm   Heart Rate (Exercise) 102 bpm 105 bpm 112 bpm 102 bpm 104 bpm   Heart Rate (Exit) 68 bpm 73 bpm 66 bpm 69 bpm 71 bpm   Rating of Perceived Exertion (Exercise) '12 12 13 13 12   '$ Symptoms None None None None None  Comments Off to a great start with exercise. [employer] on  [employer] bike. -- -- --   Duration Continue with 30 min of aerobic exercise without signs/symptoms of physical distress. Continue with 30 min of aerobic exercise without signs/symptoms of physical distress. Continue with 30 min of aerobic exercise without signs/symptoms of physical distress. Continue with 30 min of aerobic exercise without signs/symptoms of physical distress. Continue with 30 min of aerobic exercise without signs/symptoms of physical distress.   Intensity THRR unchanged THRR unchanged THRR unchanged THRR unchanged THRR unchanged     Progression   Progression Continue to progress workloads to maintain intensity without signs/symptoms of physical distress. Continue to progress workloads to maintain intensity without signs/symptoms of physical distress. Continue to progress workloads to maintain intensity without signs/symptoms of physical distress. Continue to progress workloads to maintain intensity without signs/symptoms of physical distress. Continue to progress workloads to maintain intensity without signs/symptoms of physical distress.   Average METs 3.5 4.9 4.'8 5 5     '$ Resistance Training   Training Prescription Yes Yes Yes No  Relaxation day, no weights. Yes   Weight 5 lbs 5 lbs 5 lbs -- 5 lbs   Reps 10-15 10-15 10-15 -- 10-15   Time 10 Minutes 10 Minutes 10 Minutes -- 10 Minutes     Interval Training   Interval Training No No No No No     Bike   Level 1.4 2.2 2 2.1 2   Minutes '15 15 15 15 15   '$ METs 4.18 5.94 5.55 5.77 5.81     NuStep   Level '4 5 6 7 7   '$ SPM 88 88 88 88 88   Minutes '15 15 15 15 15   '$ METs 2.8 3.8 4.1 4.3 4.3     Home Exercise Plan   Plans to continue exercise at -- -- Home (comment)  walking, recumbent bike, elliptical machine Home (comment)  walking, recumbent bike, elliptical machine Home (comment)  walking, recumbent bike, elliptical machine   Frequency -- -- Add 4 additional days to program exercise sessions. Add 4 additional days to program  exercise sessions. Add 4 additional days to program exercise sessions.   Initial Home Exercises Provided -- -- 12/24/21 12/24/21 12/24/21            Exercise Comments:   Exercise Comments     Row Name 12/13/21 1126 12/20/21 1044 12/24/21 1054 01/03/22 1116 01/19/22 1058   Exercise Comments Patient toelrated 1st session of exericse well without symptoms. Reviewed METs with patient. Reviewed home exercise guidelines and goals with patient. Reviewed METs with patient. Reviewed METs and goals with patient.    [place] Name 01/31/22 1048           Exercise Comments Reviewed METs with patient.                Exercise Goals and Review:   Exercise Goals     Row Name 12/07/21 0949             Exercise Goals   Increase Physical Activity Yes       Intervention Provide advice, education, support and counseling about physical activity/exercise needs.;Develop an individualized exercise prescription for aerobic and resistive training based on initial evaluation findings, risk stratification, comorbidities and participant's personal goals.       Expected Outcomes Short Term: Attend rehab on a regular basis to increase amount of physical activity.;Long Term: Exercising regularly at least 3-5 days a week.;Long Term: Add in home exercise  to make exercise part of routine and to increase amount of physical activity.       Increase Strength and Stamina Yes       Intervention Provide advice, education, support and counseling about physical activity/exercise needs.;Develop an individualized exercise prescription for aerobic and resistive training based on initial evaluation findings, risk stratification, comorbidities and participant's personal goals.       Expected Outcomes Short Term: Increase workloads from initial exercise prescription for resistance, speed, and METs.;Short Term: Perform resistance training exercises routinely during rehab and add in resistance training at home;Long Term: Improve  cardiorespiratory fitness, muscular endurance and strength as measured by increased METs and functional capacity (6MWT)       Able to understand and use rate of perceived exertion (RPE) scale Yes       Intervention Provide education and explanation on how to use RPE scale       Expected Outcomes Short Term: Able to use RPE daily in rehab to express subjective intensity level;Long Term:  Able to use RPE to guide intensity level when exercising independently       Knowledge and understanding of Target Heart Rate Range (THRR) Yes       Intervention Provide education and explanation of THRR including how the numbers were predicted and where they are located for reference       Expected Outcomes Short Term: Able to state/look up THRR;Long Term: Able to use THRR to govern intensity when exercising independently;Short Term: Able to use daily as guideline for intensity in rehab       Able to check pulse independently Yes       Intervention Provide education and demonstration on how to check pulse in carotid and radial arteries.;Review the importance of being able to check your own pulse for safety during independent exercise       Expected Outcomes Short Term: Able to explain why pulse checking is important during independent exercise;Long Term: Able to check pulse independently and accurately       Understanding of Exercise Prescription Yes       Intervention Provide education, explanation, and written materials on patient's individual exercise prescription       Expected Outcomes Short Term: Able to explain program exercise prescription;Long Term: Able to explain home exercise prescription to exercise independently                Exercise Goals Re-Evaluation :  Exercise Goals Re-Evaluation     Row Name 12/13/21 1126 12/24/21 1435 01/19/22 1058         Exercise Goal Re-Evaluation   Exercise Goals Review Increase Physical Activity;Able to understand and use rate of perceived exertion (RPE) scale  Increase Physical Activity;Able to understand and use rate of perceived exertion (RPE) scale;Increase Strength and Stamina;Knowledge and understanding of Target Heart Rate Range (THRR);Able to check pulse independently;Understanding of Exercise Prescription Increase Physical Activity;Able to understand and use rate of perceived exertion (RPE) scale;Increase Strength and Stamina;Knowledge and understanding of Target Heart Rate Range (THRR);Able to check pulse independently;Understanding of Exercise Prescription     Comments Patient able to understand and use RPE scale appropriately. Reviewed exercise prescription with patient. Patient is currently walking, riding his recumbent bike (RB), and/or elliptical machine 30 minutes 4 days/week as his mode of home exercise. Patient stretches daily and uses light hand weights (3 or 5 lbs) while riding the [place] for his resistance training. Patient has a smart watch to check his pulse. Patient's goal is to eat right,  maintain weight, and exercise daily. Continues to make excellent progress with exercise. Patient states he feels stronger but still gets fatigued going up inclines during his walk. Discussed  possibly trying the TM with incline to help with conditioning in this area, and patient will think about this.     Expected Outcomes Progress workloads as tolerated to help improve cardiorespiratory fitness. Continue to progress workloads as tolerated. Continue daily exercise routine. Continue to progress workloads to help build stamina and endurance.              Discharge Exercise Prescription (Final Exercise Prescription Changes):  Exercise Prescription Changes - 01/31/22 1024       Response to Exercise   Blood Pressure (Admit) 104/62    Blood Pressure (Exercise) 130/70    Blood Pressure (Exit) 98/66    Heart Rate (Admit) 71 bpm    Heart Rate (Exercise) 104 bpm    Heart Rate (Exit) 71 bpm    Rating of Perceived Exertion (Exercise) 12    Symptoms None     Duration Continue with 30 min of aerobic exercise without signs/symptoms of physical distress.    Intensity THRR unchanged      Progression   Progression Continue to progress workloads to maintain intensity without signs/symptoms of physical distress.    Average METs 5      Resistance Training   Training Prescription Yes    Weight 5 lbs    Reps 10-15    Time 10 Minutes      Interval Training   Interval Training No      Bike   Level 2    Minutes 15    METs 5.81      NuStep   Level 7    SPM 88    Minutes 15    METs 4.3      Home Exercise Plan   Plans to continue exercise at Home (comment)   walking, recumbent bike, elliptical machine   Frequency Add 4 additional days to program exercise sessions.    Initial Home Exercises Provided 12/24/21             Nutrition:  Target Goals: Understanding of nutrition guidelines, daily intake of sodium '1500mg'$ , cholesterol '200mg'$ , calories 30% from fat and 7% or less from saturated fats, daily to have 5 or more servings of fruits and vegetables.  Biometrics:  Pre Biometrics - 12/07/21 0912       Pre Biometrics   Waist Circumference 35.5 inches    Hip Circumference 39 inches    Waist to Hip Ratio 0.91 %    Triceps Skinfold 6 mm    % Body Fat 20 %    Grip Strength 47 kg    Flexibility 12.25 in    Single Leg Stand 30 seconds              Nutrition Therapy Plan and Nutrition Goals:  Nutrition Therapy & Goals - 01/14/22 1131       Nutrition Therapy   Diet Heart Healthy Diet    Drug/Food Interactions Statins/Certain Fruits      Personal Nutrition Goals   Nutrition Goal Patient to choose a daily variety of fruits, vegetables, whole grains, lean protein/plant protein, nonfat dairy as part of heart healthy lifestyle    Personal Goal #2 Patient to limit daily sodium intake to '1500mg'$ .    Personal Goal #3 Patient to identify and limit daily intake of saturated fat, trans fat, sodium, and refined carbohydrates.  Comments Goals in action. Andre Martinez continues to attend the [employer] education classes, and has implemented many heart healthy diet strategies including increased vegetables, decreased saturated fat, and reading food labels for sugar and sodium. His family is supportive of lifestyle changes.      Intervention Plan   Intervention Prescribe, educate and counsel regarding individualized specific dietary modifications aiming towards targeted core components such as weight, hypertension, lipid management, diabetes, heart failure and other comorbidities.    Expected Outcomes Short Term Goal: Understand basic principles of dietary content, such as calories, fat, sodium, cholesterol and nutrients.;Long Term Goal: Adherence to prescribed nutrition plan.             Nutrition Assessments:  Nutrition Assessments - 12/15/21 1655       Rate Your Plate Scores   Pre Score 72            MEDIFICTS Score Key: ?70 Need to make dietary changes  40-70 Heart Healthy Diet ? 40 Therapeutic Level Cholesterol Diet   Flowsheet Row INTENSIVE CARDIAC REHAB from 12/15/2021 in [place]  Picture Your Plate Total Score on Admission 72      Picture Your Plate Scores: <28 Unhealthy dietary pattern with much room for improvement. 41-50 Dietary pattern unlikely to meet recommendations for good health and room for improvement. 51-60 More healthful dietary pattern, with some room for improvement.  >60 Healthy dietary pattern, although there may be some specific behaviors that could be improved.    Nutrition Goals Re-Evaluation:  Nutrition Goals Re-Evaluation     [place] Name 12/15/21 1646 01/14/22 1131           Goals   Current Weight 183 lb 13.8 oz (83.4 kg) 181 lb (82.1 kg)      Comment Most recent labs show A1c 5.8 and improved lipid panel with HDL 35. No new labs at this time; however, most recent labs (09/30/21) show A1c 5.8, HDL 35. Andre Martinez is down 5.7# since starting with our  program.      Expected Outcome -- Goals in action. Andre Martinez continues to attend the [employer] education classes, and has implemented many heart healthy diet strategies including increased vegetables, decreased saturated fat, and reading food labels for sugar and sodium. His family is supportive of lifestyle changes. Expect improvement to A1c with continued lifestyle changes.               Nutrition Goals Re-Evaluation:  Nutrition Goals Re-Evaluation     [place] Name 12/15/21 1646 01/14/22 1131           Goals   Current Weight 183 lb 13.8 oz (83.4 kg) 181 lb (82.1 kg)      Comment Most recent labs show A1c 5.8 and improved lipid panel with HDL 35. No new labs at this time; however, most recent labs (09/30/21) show A1c 5.8, HDL 35. Andre Martinez is down 5.7# since starting with our program.      Expected Outcome -- Goals in action. Andre Martinez continues to attend the [employer] education classes, and has implemented many heart healthy diet strategies including increased vegetables, decreased saturated fat, and reading food labels for sugar and sodium. His family is supportive of lifestyle changes. Expect improvement to A1c with continued lifestyle changes.               Nutrition Goals Discharge (Final Nutrition Goals Re-Evaluation):  Nutrition Goals Re-Evaluation - 01/14/22 1131       Goals   Current Weight 181 lb (82.1 kg)  Comment No new labs at this time; however, most recent labs (09/30/21) show A1c 5.8, HDL 35. Andre Martinez is down 5.7# since starting with our program.    Expected Outcome Goals in action. Andre Martinez continues to attend the [employer] education classes, and has implemented many heart healthy diet strategies including increased vegetables, decreased saturated fat, and reading food labels for sugar and sodium. His family is supportive of lifestyle changes. Expect improvement to A1c with continued lifestyle changes.             Psychosocial: Target Goals: Acknowledge presence or absence of significant  depression and/or stress, maximize coping skills, provide positive support system. Participant is able to verbalize types and ability to use techniques and skills needed for reducing stress and depression.  Initial Review & Psychosocial Screening:  Initial Psych Review & Screening - 12/07/21 1031       Initial Review   Current issues with None Identified      Family Dynamics   Good Support System? Yes   Andre Martinez has his wife and children for support     Barriers   Psychosocial barriers to participate in program There are no identifiable barriers or psychosocial needs.      Screening Interventions   Interventions Encouraged to exercise             Quality of Life Scores:  Quality of Life - 12/07/21 1043       Quality of Life   Select Quality of Life      Quality of Life Scores   Health/Function Pre 21.6 %    Socioeconomic Pre 26.14 %    Psych/Spiritual Pre 26.14 %    Family Pre 27.6 %    GLOBAL Pre 24.35 %            Scores of 19 and below usually indicate a poorer quality of life in these areas.  A difference of  2-3 points is a clinically meaningful difference.  A difference of 2-3 points in the total score of the Quality of Life Index has been associated with significant improvement in overall quality of life, self-image, physical symptoms, and general health in studies assessing change in quality of life.  PHQ-9: Review Flowsheet  More data exists      12/07/2021 04/15/2021 04/13/2020 04/10/2019 03/06/2018  Depression screen PHQ 2/9  Decreased Interest 0 0 0 0 0  Down, Depressed, Hopeless 0 0 0 0 0  PHQ - 2 Score 0 0 0 0 0   Interpretation of Total Score  Total Score Depression Severity:  1-4 = Minimal depression, 5-9 = Mild depression, 10-14 = Moderate depression, 15-19 = Moderately severe depression, 20-27 = Severe depression   Psychosocial Evaluation and Intervention:   Psychosocial Re-Evaluation:  Psychosocial Re-Evaluation     [place] Name 12/13/21 1427  01/04/22 1133 02/01/22 1134         Psychosocial Re-Evaluation   Current issues with None Identified None Identified None Identified     Interventions Encouraged to attend Cardiac Rehabilitation for the exercise Encouraged to attend Cardiac Rehabilitation for the exercise Encouraged to attend Cardiac Rehabilitation for the exercise     Continue Psychosocial Services  No Follow up required No Follow up required No Follow up required              Psychosocial Discharge (Final Psychosocial Re-Evaluation):  Psychosocial Re-Evaluation - 02/01/22 1134       Psychosocial Re-Evaluation   Current issues with None Identified    Interventions Encouraged  to attend Cardiac Rehabilitation for the exercise    Continue Psychosocial Services  No Follow up required             Vocational Rehabilitation: Provide vocational rehab assistance to qualifying candidates.   Vocational Rehab Evaluation & Intervention:  Vocational Rehab - 12/07/21 1008       Initial Vocational Rehab Evaluation & Intervention   Assessment shows need for Vocational Rehabilitation No   Andre Martinez is retired and does not need vocational rehab at this time.            Education: Education Goals: Education classes will be provided on a weekly basis, covering required topics. Participant will state understanding/return demonstration of topics presented.    Education     Row Name 12/13/21 1300     Education   Cardiac Education Topics [employer]   Environmental consultant Psychosocial   Psychosocial Workshop New Thoughts, New Behaviors   Instruction Review Code 1- Verbalizes Understanding   Class Start Time 1137   Class Stop Time 1225   Class Time Calculation (min) 48 min    [place] Name 12/15/21 1300     Education   Cardiac Education Topics [employer]   Financial trader   Weekly Topic One-Pot Wonders   Instruction  Review Code 1- Verbalizes Understanding   Class Start Time 1145   Class Stop Time 1227   Class Time Calculation (min) 42 min    [place] Name 12/17/21 1400     Education   Cardiac Education Topics [facility]   Lexicographer Nutrition   Nutrition Dining Out - Part 1   Instruction Review Code 1- Verbalizes Understanding   Class Start Time 1137   Class Stop Time 1230   Class Time Calculation (min) 53 min    [place] Name 12/20/21 1200     Education   Cardiac Education Topics [employer]   Academic librarian Exercise Education   Exercise Education Biomechanial Limitations   Instruction Review Code 1- Verbalizes Understanding   Class Start Time 1139   Class Stop Time 1217   Class Time Calculation (min) 38 min    [place] Name 12/22/21 1300     Education   Cardiac Education Topics [employer]in   Financial trader   Weekly Topic Fast Evening Meals   Instruction Review Code 1- Verbalizes Understanding   Class Start Time 1497   Class Stop Time 1223   Class Time Calculation (min) 46 min    [place] Name 12/24/21 1200     Education   Cardiac Education Topics [employer]   Charity fundraiser Exercise Physiologist   Select Psychosocial   Psychosocial How Our Thoughts Can Heal Our Hearts   Instruction Review Code 1- Verbalizes Understanding   Class Start Time 1152   Class Stop Time 0263   Class Time Calculation (min) 43 min    [place] Name 12/27/21 1600     Education   Cardiac Education Topics Pritikin   IT sales professional Nutrition   Nutrition Workshop Fueling a Designer, multimedia  Instruction Review Code 1- Verbalizes Understanding   Class Start Time 1150   Class Stop Time 1240   Class Time Calculation (min) 50 min    [place] Name 12/29/21 1200     Education   Cardiac  Education Topics [employer]   Educator Dietitian   Weekly Topic Adding Flavor - Sodium-Free   Instruction Review Code 1- Verbalizes Understanding   Class Start Time 1138   Class Stop Time 1228   Class Time Calculation (min) 50 min    [place] Name 12/31/21 1200     Education   Cardiac Education Topics Pritikin   Tax inspector General Education   General Education Heart Disease Risk Reduction   Instruction Review Code 1- Verbalizes Understanding   Class Start Time 1145   Class Stop Time 1221   Class Time Calculation (min) 36 min    [place] Name 01/03/22 1300     Education   Cardiac Education Topics Pritikin   Select Workshops     Workshops   Educator Exercise Physiologist   Select Psychosocial   Psychosocial Workshop Recognizing and Reducing Stress   Instruction Review Code 1- Verbalizes Understanding   Class Start Time 1150   Class Stop Time 1238   Class Time Calculation (min) 48 min    [place] Name 01/07/22 1300     Education   Cardiac Education Topics Pritikin   Lexicographer Nutrition   Nutrition Overview of the [place] Eating Plan   Instruction Review Code 1- Verbalizes Understanding   Class Start Time 1150   Class Stop Time 1225   Class Time Calculation (min) 35 min    [place] Name 01/10/22 1200     Education   Cardiac Education Topics Pritikin   Select Workshops     Workshops   Educator Exercise Physiologist   Select Exercise   Exercise Workshop Hotel manager and Fall Prevention   Instruction Review Code 1- Verbalizes Understanding   Class Start Time 1145   Class Stop Time 1229   Class Time Calculation (min) 44 min    [place] Name 01/12/22 1300     Education   Cardiac Education Topics Pritikin   Financial trader   Weekly Topic Personalizing Your Pritikin Plate    Instruction Review Code 1- Verbalizes Understanding   Class Start Time 1145   Class Stop Time 1216   Class Time Calculation (min) 31 min    [place] Name 01/19/22 1300     Education   Cardiac Education Topics Pritikin   Financial trader   Weekly Topic Tasty Appetizers and Snacks   Instruction Review Code 1- Verbalizes Understanding   Class Start Time 1144   Class Stop Time 1227   Class Time Calculation (min) 43 min    [place] Name 01/21/22 1400     Education   Cardiac Education Topics Pritikin   Scientist, research (life sciences)   Educator Dietitian   Select Nutrition   Nutrition Other  Label Reading   Instruction Review Code 1- Verbalizes Understanding   Class Start Time 1145   Class Stop Time 1240   Class Time Calculation (min) 55 min  [place] Name 01/24/22 1400     Education   Cardiac Education Topics Pritikin   Scientist, research (life sciences)   Educator Dietitian   Select Nutrition   Nutrition Calorie Density   Instruction Review Code 1- Verbalizes Understanding   Class Start Time 1140   Class Stop Time 1230   Class Time Calculation (min) 50 min    [place] Name 01/26/22 1200     Education   Cardiac Education Topics Pritikin   Financial trader   Weekly Topic [employer] Desserts   Instruction Review Code 1- Verbalizes Understanding   Class Start Time 1130   Class Stop Time 1205   Class Time Calculation (min) 35 min    [place] Name 01/31/22 1300     Education   Cardiac Education Topics [place]   Environmental consultant Exercise   Exercise Workshop Exercise Basics: Press photographer   Instruction Review Code 1- Verbalizes Understanding   Class Start Time 1140   Class Stop Time 1224   Class Time Calculation (min) 44 min            Core Videos: Exercise    Move It!  Clinical staff conducted group or  individual video education with verbal and written material and guidebook.  Patient learns the recommended Pritikin exercise program. Exercise with the goal of living a long, healthy life. Some of the health benefits of exercise include controlled diabetes, healthier blood pressure levels, improved cholesterol levels, improved heart and lung capacity, improved sleep, and better body composition. Everyone should speak with their doctor before starting or changing an exercise routine.  Biomechanical Limitations Clinical staff conducted group or individual video education with verbal and written material and guidebook.  Patient learns how biomechanical limitations can impact exercise and how we can mitigate and possibly overcome limitations to have an impactful and balanced exercise routine.  Body Composition Clinical staff conducted group or individual video education with verbal and written material and guidebook.  Patient learns that body composition (ratio of muscle mass to fat mass) is a key component to assessing overall fitness, rather than body weight alone. Increased fat mass, especially visceral belly fat, can put [place] at increased risk for metabolic syndrome, type 2 diabetes, heart disease, and even death. It is recommended to combine diet and exercise (cardiovascular and resistance training) to improve your body composition. Seek guidance from your physician and exercise physiologist before implementing an exercise routine.  Exercise Action Plan Clinical staff conducted group or individual video education with verbal and written material and guidebook.  Patient learns the recommended strategies to achieve and enjoy long-term exercise adherence, including variety, self-motivation, self-efficacy, and positive decision making. Benefits of exercise include fitness, good health, weight management, more energy, better sleep, less stress, and overall well-being.  Medical   Heart Disease Risk  Reduction Clinical staff conducted group or individual video education with verbal and written material and guidebook.  Patient learns our heart is our most vital organ as it circulates oxygen, nutrients, white blood cells, and hormones throughout the entire body, and carries waste away. Data supports a plant-based eating plan like the Pritikin Program for its effectiveness in slowing progression of and reversing heart disease. The video provides a number of recommendations to address heart disease.   Metabolic Syndrome and Belly Fat  Clinical staff conducted group or  individual video education with verbal and written material and guidebook.  Patient learns what metabolic syndrome is, how it leads to heart disease, and how one can reverse it and keep it from coming back. You have metabolic syndrome if you have 3 of the following 5 criteria: abdominal obesity, high blood pressure, high triglycerides, low HDL cholesterol, and high blood sugar.  Hypertension and Heart Disease Clinical staff conducted group or individual video education with verbal and written material and guidebook.  Patient learns that high blood pressure, or hypertension, is very common in the [place]. Hypertension is largely due to excessive salt intake, but other important risk factors include being overweight, physical inactivity, drinking too much alcohol, smoking, and not eating enough potassium from fruits and vegetables. High blood pressure is a leading risk factor for heart attack, stroke, congestive heart failure, dementia, kidney failure, and premature death. Long-term effects of excessive salt intake include stiffening of the arteries and thickening of heart muscle and organ damage. Recommendations include ways to reduce hypertension and the risk of heart disease.  Diseases of Our Time - Focusing on Diabetes Clinical staff conducted group or individual video education with verbal and written material and guidebook.   Patient learns why the best way to stop diseases of our time is prevention, through food and other lifestyle changes. Medicine (such as prescription pills and surgeries) is often only a Band-Aid on the problem, not a long-term solution. Most common diseases of our time include obesity, type 2 diabetes, hypertension, heart disease, and cancer. The Pritikin Program is recommended and has been proven to help reduce, reverse, and/or prevent the damaging effects of metabolic syndrome.  Nutrition   Overview of the Pritikin Eating Plan  Clinical staff conducted group or individual video education with verbal and written material and guidebook.  Patient learns about the [place] for disease risk reduction. The [place] emphasizes a wide variety of unrefined, minimally-processed carbohydrates, like fruits, vegetables, whole grains, and legumes. Go, Caution, and Stop food choices are explained. Plant-based and lean animal proteins are emphasized. Rationale provided for low sodium intake for blood pressure control, low added sugars for blood sugar stabilization, and low added fats and oils for coronary artery disease risk reduction and weight management.  Calorie Density  Clinical staff conducted group or individual video education with verbal and written material and guidebook.  Patient learns about calorie density and how it impacts the Pritikin Eating Plan. Knowing the characteristics of the food you choose will help you decide whether those foods will lead to weight gain or weight loss, and whether you want to consume more or less of them. Weight loss is usually a side effect of the Pritikin Eating Plan because of its focus on low calorie-dense foods.  Label Reading  Clinical staff conducted group or individual video education with verbal and written material and guidebook.  Patient learns about the Pritikin recommended label reading guidelines and corresponding recommendations  regarding calorie density, added sugars, sodium content, and whole grains.  Dining Out - Part 1  Clinical staff conducted group or individual video education with verbal and written material and guidebook.  Patient learns that restaurant meals can be sabotaging because they can be so high in calories, fat, sodium, and/or sugar. Patient learns recommended strategies on how to positively address this and avoid unhealthy pitfalls.  Facts on Fats  Clinical staff conducted group or individual video education with verbal and written material and guidebook.  Patient learns that lifestyle  modifications can be just as effective, if not more so, as many medications for lowering your risk of heart disease. A Pritikin lifestyle can help to reduce your risk of inflammation and atherosclerosis (cholesterol build-up, or plaque, in the artery walls). Lifestyle interventions such as dietary choices and physical activity address the cause of atherosclerosis. A review of the types of fats and their impact on blood cholesterol levels, along with dietary recommendations to reduce fat intake is also included.  Nutrition Action Plan  Clinical staff conducted group or individual video education with verbal and written material and guidebook.  Patient learns how to incorporate Pritikin recommendations into their lifestyle. Recommendations include planning and keeping personal health goals in mind as an important part of their success.  Healthy Mind-Set    Healthy Minds, Bodies, Hearts  Clinical staff conducted group or individual video education with verbal and written material and guidebook.  Patient learns how to identify when they are stressed. Video will discuss the impact of that stress, as well as the many benefits of stress management. Patient will also be introduced to stress management techniques. The way we think, act, and feel has an impact on our hearts.  How Our Thoughts Can Heal Our Hearts  Clinical staff  conducted group or individual video education with verbal and written material and guidebook.  Patient learns that negative thoughts can cause depression and anxiety. This can result in negative lifestyle behavior and serious health problems. Cognitive behavioral therapy is an effective method to help control our thoughts in order to change and improve our emotional outlook.  Additional Videos:  Exercise    Improving Performance  Clinical staff conducted group or individual video education with verbal and written material and guidebook.  Patient learns to use a non-linear approach by alternating intensity levels and lengths of time spent exercising to help burn more calories and lose more body fat. Cardiovascular exercise helps improve heart health, metabolism, hormonal balance, blood sugar control, and recovery from fatigue. Resistance training improves strength, endurance, balance, coordination, reaction time, metabolism, and muscle mass. Flexibility exercise improves circulation, posture, and balance. Seek guidance from your physician and exercise physiologist before implementing an exercise routine and learn your capabilities and proper form for all exercise.  Introduction to Yoga  Clinical staff conducted group or individual video education with verbal and written material and guidebook.  Patient learns about yoga, a discipline of the coming together of mind, breath, and body. The benefits of yoga include improved flexibility, improved range of motion, better posture and core strength, increased lung function, weight loss, and positive self-image. Yoga's heart health benefits include lowered blood pressure, healthier heart rate, decreased cholesterol and triglyceride levels, improved immune function, and reduced stress. Seek guidance from your physician and exercise physiologist before implementing an exercise routine and learn your capabilities and proper form for all exercise.  Medical   Aging:  Enhancing Your Quality of Life  Clinical staff conducted group or individual video education with verbal and written material and guidebook.  Patient learns key strategies and recommendations to stay in good physical health and enhance quality of life, such as prevention strategies, having an advocate, securing a [place], and keeping a list of medications and system for tracking them. It also discusses how to avoid risk for bone loss.  Biology of Weight Control  Clinical staff conducted group or individual video education with verbal and written material and guidebook.  Patient learns that weight gain occurs because  we consume more calories than we burn (eating more, moving less). Even if your body weight is normal, you may have higher ratios of fat compared to muscle mass. Too much body fat puts you at increased risk for cardiovascular disease, heart attack, stroke, type 2 diabetes, and obesity-related cancers. In addition to exercise, following the [place] can help reduce your risk.  Decoding Lab Results  Clinical staff conducted group or individual video education with verbal and written material and guidebook.  Patient learns that lab test reflects one measurement whose values change over time and are influenced by many factors, including medication, stress, sleep, exercise, food, hydration, pre-existing medical conditions, and more. It is recommended to use the knowledge from this video to become more involved with your lab results and evaluate your numbers to speak with your doctor.   Diseases of Our Time - Overview  Clinical staff conducted group or individual video education with verbal and written material and guidebook.  Patient learns that according to the CDC, 50% to 70% of chronic diseases (such as obesity, type 2 diabetes, elevated lipids, hypertension, and heart disease) are avoidable through lifestyle improvements including healthier food  choices, listening to satiety cues, and increased physical activity.  Sleep Disorders Clinical staff conducted group or individual video education with verbal and written material and guidebook.  Patient learns how good quality and duration of sleep are important to overall health and well-being. Patient also learns about sleep disorders and how they impact health along with recommendations to address them, including discussing with a physician.  Nutrition  Dining Out - Part 2 Clinical staff conducted group or individual video education with verbal and written material and guidebook.  Patient learns how to plan ahead and communicate in order to maximize their dining experience in a healthy and nutritious manner. Included are recommended food choices based on the type of restaurant the patient is visiting.   Fueling a Best boy conducted group or individual video education with verbal and written material and guidebook.  There is a strong connection between our food choices and our health. Diseases like obesity and type 2 diabetes are very prevalent and are in large-part due to lifestyle choices. The Pritikin Eating Plan provides plenty of food and hunger-curbing satisfaction. It is easy to follow, affordable, and helps reduce health risks.  Menu Workshop  Clinical staff conducted group or individual video education with verbal and written material and guidebook.  Patient learns that restaurant meals can sabotage health goals because they are often packed with calories, fat, sodium, and sugar. Recommendations include strategies to plan ahead and to communicate with the manager, chef, or server to help order a healthier meal.  Planning Your Eating Strategy  Clinical staff conducted group or individual video education with verbal and written material and guidebook.  Patient learns about the [place] and its benefit of reducing the risk of disease. The [place] does not focus on calories. Instead, it emphasizes high-quality, nutrient-rich foods. By knowing the characteristics of the foods, we choose, we can determine their calorie density and make informed decisions.  Targeting Your Nutrition Priorities  Clinical staff conducted group or individual video education with verbal and written material and guidebook.  Patient learns that lifestyle habits have a tremendous impact on disease risk and progression. This video provides eating and physical activity recommendations based on your personal health goals, such as reducing LDL cholesterol, losing weight, preventing or controlling type 2 diabetes,  and reducing high blood pressure.  Vitamins and Minerals  Clinical staff conducted group or individual video education with verbal and written material and guidebook.  Patient learns different ways to obtain key vitamins and minerals, including through a recommended healthy diet. It is important to discuss all supplements you take with your doctor.   Healthy Mind-Set    Smoking Cessation  Clinical staff conducted group or individual video education with verbal and written material and guidebook.  Patient learns that cigarette smoking and tobacco addiction pose a serious health risk which affects millions of people. Stopping smoking will significantly reduce the risk of heart disease, lung disease, and many forms of cancer. Recommended strategies for quitting are covered, including working with your doctor to develop a successful plan.  Culinary   Becoming a Financial trader conducted group or individual video education with verbal and written material and guidebook.  Patient learns that cooking at home can be healthy, cost-effective, quick, and puts them in control. Keys to cooking healthy recipes will include looking at your recipe, assessing your equipment needs, planning ahead, making it simple, choosing cost-effective seasonal ingredients,  and limiting the use of added fats, salts, and sugars.  Cooking - Breakfast and Snacks  Clinical staff conducted group or individual video education with verbal and written material and guidebook.  Patient learns how important breakfast is to satiety and nutrition through the entire day. Recommendations include key foods to eat during breakfast to help stabilize blood sugar levels and to prevent overeating at meals later in the day. Planning ahead is also a key component.  Cooking - Human resources officer conducted group or individual video education with verbal and written material and guidebook.  Patient learns eating strategies to improve overall health, including an approach to cook more at home. Recommendations include thinking of animal protein as a side on your plate rather than center stage and focusing instead on lower calorie dense options like vegetables, fruits, whole grains, and plant-based proteins, such as beans. Making sauces in large quantities to freeze for later and leaving the skin on your vegetables are also recommended to maximize your experience.  Cooking - Healthy Salads and Dressing Clinical staff conducted group or individual video education with verbal and written material and guidebook.  Patient learns that vegetables, fruits, whole grains, and legumes are the foundations of the [place]. Recommendations include how to incorporate each of these in flavorful and healthy salads, and how to create homemade salad dressings. Proper handling of ingredients is also covered. Cooking - Soups and [employer] - Soups and Desserts Clinical staff conducted group or individual video education with verbal and written material and guidebook.  Patient learns that Pritikin soups and desserts make for easy, nutritious, and delicious snacks and meal components that are low in sodium, fat, sugar, and calorie density, while high in vitamins, minerals, and filling  fiber. Recommendations include simple and healthy ideas for soups and desserts.   Overview     The Pritikin Solution Program Overview Clinical staff conducted group or individual video education with verbal and written material and guidebook.  Patient learns that the results of the [place] Program have been documented in more than 100 articles published in peer-reviewed journals, and the benefits include reducing risk factors for (and, in some cases, even reversing) high cholesterol, high blood pressure, type 2 diabetes, obesity, and more! An overview of the three key pillars of the Pritikin Program will be covered:  eating well, doing regular exercise, and having a healthy mind-set.  WORKSHOPS  Exercise: Exercise Basics: Building Your Action Plan Clinical staff led group instruction and group discussion with PowerPoint presentation and patient guidebook. To enhance the learning environment the use of posters, models and videos may be added. At the conclusion of this workshop, patients will comprehend the difference between physical activity and exercise, as well as the benefits of incorporating both, into their routine. Patients will understand the FITT (Frequency, Intensity, Time, and Type) principle and how to use it to build an exercise action plan. In addition, safety concerns and other considerations for exercise and cardiac rehab will be addressed by the presenter. The purpose of this lesson is to promote a comprehensive and effective weekly exercise routine in order to improve patients' overall level of fitness.   Managing Heart Disease: Your Path to a Healthier Heart Clinical staff led group instruction and group discussion with PowerPoint presentation and patient guidebook. To enhance the learning environment the use of posters, models and videos may be added.At the conclusion of this workshop, patients will understand the anatomy and physiology of the heart. Additionally, they will  understand how Pritikin's three pillars impact the risk factors, the progression, and the management of heart disease.  The purpose of this lesson is to provide a high-level overview of the heart, heart disease, and how the Pritikin lifestyle positively impacts risk factors.  Exercise Biomechanics Clinical staff led group instruction and group discussion with PowerPoint presentation and patient guidebook. To enhance the learning environment the use of posters, models and videos may be added. Patients will learn how the structural parts of their bodies function and how these functions impact their daily activities, movement, and exercise. Patients will learn how to promote a neutral spine, learn how to manage pain, and identify ways to improve their physical movement in order to promote healthy living. The purpose of this lesson is to expose patients to common physical limitations that impact physical activity. Participants will learn practical ways to adapt and manage aches and pains, and to minimize their effect on regular exercise. Patients will learn how to maintain good posture while sitting, walking, and lifting.  Balance Training and Fall Prevention  Clinical staff led group instruction and group discussion with PowerPoint presentation and patient guidebook. To enhance the learning environment the use of posters, models and videos may be added. At the conclusion of this workshop, patients will understand the importance of their sensorimotor skills (vision, proprioception, and the vestibular system) in maintaining their ability to balance as they age. Patients will apply a variety of balancing exercises that are appropriate for their current level of function. Patients will understand the common causes for poor balance, possible solutions to these problems, and ways to modify their physical environment in order to minimize their fall risk. The purpose of this lesson is to teach patients  about the importance of maintaining balance as they age and ways to minimize their risk of falling.  WORKSHOPS   Nutrition:  Fueling a Scientist, research (physical sciences) led group instruction and group discussion with PowerPoint presentation and patient guidebook. To enhance the learning environment the use of posters, models and videos may be added. Patients will review the foundational principles of the [place] and understand what constitutes a serving size in each of the food groups. Patients will also learn [facility]in-friendly foods that are better choices when away from home and review make-ahead meal and snack options. Calorie density will be  reviewed and applied to three nutrition priorities: weight maintenance, weight loss, and weight gain. The purpose of this lesson is to reinforce (in a group setting) the key concepts around what patients are recommended to eat and how to apply these guidelines when away from home by planning and selecting Pritikin-friendly options. Patients will understand how calorie density may be adjusted for different weight management goals.  Mindful Eating  Clinical staff led group instruction and group discussion with PowerPoint presentation and patient guidebook. To enhance the learning environment the use of posters, models and videos may be added. Patients will briefly review the concepts of the [place] and the importance of low-calorie dense foods. The concept of mindful eating will be introduced as well as the importance of paying attention to internal hunger signals. Triggers for non-hunger eating and techniques for dealing with triggers will be explored. The purpose of this lesson is to provide patients with the opportunity to review the basic principles of the [place], discuss the value of eating mindfully and how to measure internal cues of hunger and fullness using the Hunger Scale. Patients will also discuss reasons for non-hunger  eating and learn strategies to use for controlling emotional eating.  Targeting Your Nutrition Priorities Clinical staff led group instruction and group discussion with PowerPoint presentation and patient guidebook. To enhance the learning environment the use of posters, models and videos may be added. Patients will learn how to determine their genetic susceptibility to disease by reviewing their family history. Patients will gain insight into the importance of diet as part of an overall healthy lifestyle in mitigating the impact of genetics and other environmental insults. The purpose of this lesson is to provide patients with the opportunity to assess their personal nutrition priorities by looking at their family history, their own health history and current risk factors. Patients will also be able to discuss ways of prioritizing and modifying the [place] for their highest risk areas  Menu  Clinical staff led group instruction and group discussion with PowerPoint presentation and patient guidebook. To enhance the learning environment the use of posters, models and videos may be added. Using menus brought in from [employer], or printed from [employer], patients will apply the [place] dining out guidelines that were presented in the [employer] video. Patients will also be able to practice these guidelines in a variety of provided scenarios. The purpose of this lesson is to provide patients with the opportunity to practice hands-on learning of the [place] with actual menus and practice scenarios.  Label Reading Clinical staff led group instruction and group discussion with PowerPoint presentation and patient guidebook. To enhance the learning environment the use of posters, models and videos may be added. Patients will review and discuss the Pritikin label reading guidelines presented in [facility]ritikin's Label Reading Educational series video.  Using fool labels brought in from local grocery stores and markets, patients will apply the label reading guidelines and determine if the packaged food meet the Pritikin guidelines. The purpose of this lesson is to provide patients with the opportunity to review, discuss, and practice hands-on learning of the Pritikin Label Reading guidelines with actual packaged food labels. [place] Workshops are designed to teach patients ways to prepare quick, simple, and affordable recipes at home. The importance of nutrition's role in chronic disease risk reduction is reflected in its emphasis in the overall [facility]rit[facility]in program. By learning how to prepare  essential core Pritikin Eating Plan recipes, patients will increase control over what they eat; be able to customize the flavor of foods without the use of added salt, sugar, or fat; and improve the quality of the food they consume. By learning a set of core recipes which are easily assembled, quickly prepared, and affordable, patients are more likely to prepare more healthy foods at home. These workshops focus on convenient breakfasts, simple entres, side dishes, and desserts which can be prepared with minimal effort and are consistent with nutrition recommendations for cardiovascular risk reduction. 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 The chef or RD has a clear understanding of the importance of minimizing - if not completely eliminating - added fat, sugar, and sodium in recipes. Throughout the series of [place] Workshop sessions, patients will learn about healthy ingredients and efficient methods of cooking to build confidence in their capability to prepare    Cooking School weekly topics:  Adding Flavor- Sodium-Free  Fast and Healthy Breakfasts  Powerhouse Plant-Based Proteins  Satisfying Salads and Dressings  Simple Sides and  Sauces  International Cuisine-Spotlight on the [place] Zones  Delicious Desserts  Savory Soups  Efficiency Cooking - Meals in a Snap  Tasty Appetizers and Snacks  Comforting Weekend Breakfasts  One-Pot Wonders   Fast Evening Meals  Easy [place] (Psychosocial): New Thoughts, New Behaviors Clinical staff led group instruction and group discussion with PowerPoint presentation and patient guidebook. To enhance the learning environment the use of posters, models and videos may be added. Patients will learn and practice techniques for developing effective health and lifestyle goals. Patients will be able to effectively apply the goal setting process learned to develop at least one new personal goal.  The purpose of this lesson is to expose patients to a new skill set of behavior modification techniques such as techniques setting SMART goals, overcoming barriers, and achieving new thoughts and new behaviors.  Managing Moods and Relationships Clinical staff led group instruction and group discussion with PowerPoint presentation and patient guidebook. To enhance the learning environment the use of posters, models and videos may be added. Patients will learn how emotional and chronic stress factors can impact their health and relationships. They will learn healthy ways to manage their moods and utilize positive coping mechanisms. In addition, ICR patients will learn ways to improve communication skills. The purpose of this lesson is to expose patients to ways of understanding how one's mood and health are intimately connected. Developing a healthy outlook can help build positive relationships and connections with others. Patients will understand the importance of utilizing effective communication skills that include actively listening and being heard. They will learn and understand the importance of the "4 Cs" and especially Connections in  fostering of a Healthy Mind-Set.  Healthy Sleep for a Healthy Heart Clinical staff led group instruction and group discussion with PowerPoint presentation and patient guidebook. To enhance the learning environment the use of posters, models and videos may be added. At the conclusion of this workshop, patients will be able to demonstrate knowledge of the importance of sleep to overall health, well-being, and quality of life. They will understand the symptoms of, and treatments for, common sleep disorders. Patients will also be able to identify daytime and nighttime behaviors which impact sleep, and they will be able to apply these tools to help manage  sleep-related challenges. The purpose of this lesson is to provide patients with a general overview of sleep and outline the importance of quality sleep. Patients will learn about a few of the most common sleep disorders. Patients will also be introduced to the concept of "sleep hygiene," and discover ways to self-manage certain sleeping problems through simple daily behavior changes. Finally, the workshop will motivate patients by clarifying the links between quality sleep and their goals of heart-healthy living.   Recognizing and Reducing Stress Clinical staff led group instruction and group discussion with PowerPoint presentation and patient guidebook. To enhance the learning environment the use of posters, models and videos may be added. At the conclusion of this workshop, patients will be able to understand the types of stress reactions, differentiate between acute and chronic stress, and recognize the impact that chronic stress has on their health. They will also be able to apply different coping mechanisms, such as reframing negative self-talk. Patients will have the opportunity to practice a variety of stress management techniques, such as deep abdominal breathing, progressive muscle relaxation, and/or guided imagery.  The purpose of this lesson is to  educate patients on the role of stress in their lives and to provide healthy techniques for coping with it.  Learning Barriers/Preferences:  Learning Barriers/Preferences - 12/07/21 1027       Learning Barriers/Preferences   Learning Barriers None    Learning Preferences Pictoral;Video             Education Topics:  Knowledge Questionnaire Score:  Knowledge Questionnaire Score - 12/07/21 1044       Knowledge Questionnaire Score   Pre Score 23/24             Core Components/Risk Factors/Patient Goals at Admission:  Personal Goals and Risk Factors at Admission - 12/07/21 1044       Core Components/Risk Factors/Patient Goals on Admission   Hypertension Yes    Intervention Provide education on lifestyle modifcations including regular physical activity/exercise, weight management, moderate sodium restriction and increased consumption of fresh fruit, vegetables, and low fat dairy, alcohol moderation, and smoking cessation.;Monitor prescription use compliance.    Expected Outcomes Short Term: Continued assessment and intervention until BP is < 140/76m HG in hypertensive participants. < 130/843mHG in hypertensive participants with diabetes, heart failure or chronic kidney disease.;Long Term: Maintenance of blood pressure at goal levels.    Lipids Yes    Intervention Provide education and support for participant on nutrition & aerobic/resistive exercise along with prescribed medications to achieve LDL '70mg'$ , HDL >'40mg'$ .    Expected Outcomes Short Term: Participant states understanding of desired cholesterol values and is compliant with medications prescribed. Participant is following exercise prescription and nutrition guidelines.;Long Term: Cholesterol controlled with medications as prescribed, with individualized exercise RX and with personalized nutrition plan. Value goals: LDL < '70mg'$ , HDL > 40 mg.             Core Components/Risk Factors/Patient Goals Review:   Goals and  Risk Factor Review     Row Name 12/13/21 1109 01/04/22 1133 02/01/22 1134         Core Components/Risk Factors/Patient Goals Review   Personal Goals Review Lipids;Hypertension Lipids;Hypertension Lipids;Hypertension     Review Patient started intensive cardiac rehab today, 12/13/21, he tolerated exercise well, vital signs stable, denies pain. DaLinna Hoffas been doing well with exercise at intensive cardiac rehab and has not reported any complaints or chest pain or discomfort. Vital signs and weights have been stable DaLinna Hoffas been  doing well with exercise at intensive cardiac rehab. Vital signs and weights have been stable. Andre Martinez has lost 2.4 kg since starting the program. Andre Martinez will complete cardiac rehab the end of the month.     Expected Outcomes Patient will maintain healhty lifestyle as he is provided the knowledge and tools neccessary to complete goals; he will increase exercise stamina druing the intensive cardiac rehab program. Patient will maintain healhty lifestyle as he is provided the knowledge and tools neccessary to complete goals; he will increase exercise stamina druing the intensive cardiac rehab program. Patient will maintain healhty lifestyle as he is provided the knowledge and tools neccessary to complete goals; he will increase exercise stamina druing the intensive cardiac rehab program.              Core Components/Risk Factors/Patient Goals at Discharge (Final Review):   Goals and Risk Factor Review - 02/01/22 1134       Core Components/Risk Factors/Patient Goals Review   Personal Goals Review Lipids;Hypertension    Review Andre Martinez has been doing well with exercise at intensive cardiac rehab. Vital signs and weights have been stable. Andre Martinez has lost 2.4 kg since starting the program. Andre Martinez will complete cardiac rehab the end of the month.    Expected Outcomes Patient will maintain healhty lifestyle as he is provided the knowledge and tools neccessary to complete goals; he will increase  exercise stamina druing the intensive cardiac rehab program.             ITP Comments:  ITP Comments     Row Name 12/07/21 0912 12/13/21 1426 01/04/22 1130 02/01/22 1133     ITP Comments Medical Director- Dr. Fransico Him, MD. Introduction to [facility] Education Program/ Intensive Cardiac Rehab. Initial [facility] Orientation Packet Reviewed with the Patient. 30 Day ITP Review. Dan started intensive cardiac rehab on 12/13/21. Dan did well with exercise. 30 Day ITP Review. Andre Martinez has good attendance and participation in intensive cardiac rehab. 30 Day ITP Review. Andre Martinez has good attendance and participation in intensive cardiac rehab. Andre Martinez will complete cardiac rehab at the end of the month.             Comments: See ITP Comments

## 2022-02-02 ENCOUNTER — Encounter (HOSPITAL_COMMUNITY)
Admission: RE | Admit: 2022-02-02 | Discharge: 2022-02-02 | Disposition: A | Discharge status: Home / Self Care | Payer: Medicare Other | Source: Ambulatory Visit | Attending: Cardiology | Admitting: Cardiology

## 2022-02-02 DIAGNOSIS — I214 Non-ST elevation (NSTEMI) myocardial infarction: Secondary | ICD-10-CM

## 2022-02-02 DIAGNOSIS — Z955 Presence of coronary angioplasty implant and graft: Secondary | ICD-10-CM | POA: Diagnosis not present

## 2022-02-04 ENCOUNTER — Encounter (HOSPITAL_COMMUNITY): Payer: Medicare Other

## 2022-02-07 ENCOUNTER — Encounter (HOSPITAL_COMMUNITY)
Admission: RE | Admit: 2022-02-07 | Discharge: 2022-02-07 | Disposition: A | Discharge status: Home / Self Care | Payer: Medicare Other | Source: Ambulatory Visit | Attending: Cardiology | Admitting: Cardiology

## 2022-02-07 DIAGNOSIS — I214 Non-ST elevation (NSTEMI) myocardial infarction: Secondary | ICD-10-CM | POA: Diagnosis not present

## 2022-02-07 DIAGNOSIS — Z955 Presence of coronary angioplasty implant and graft: Secondary | ICD-10-CM | POA: Diagnosis not present

## 2022-02-09 ENCOUNTER — Encounter (HOSPITAL_COMMUNITY)
Admission: RE | Admit: 2022-02-09 | Discharge: 2022-02-09 | Disposition: A | Discharge status: Home / Self Care | Payer: Medicare Other | Source: Ambulatory Visit | Attending: Cardiology | Admitting: Cardiology

## 2022-02-09 DIAGNOSIS — I214 Non-ST elevation (NSTEMI) myocardial infarction: Secondary | ICD-10-CM

## 2022-02-09 DIAGNOSIS — Z955 Presence of coronary angioplasty implant and graft: Secondary | ICD-10-CM | POA: Diagnosis not present

## 2022-02-09 NOTE — Progress Notes (Signed)
Discharge Progress Report  Patient Details  Name: Andre Martinez MRN: [id number] Date of Birth: September 12, 1955 Referring Provider:   Flowsheet Row INTENSIVE CARDIAC REHAB ORIENT from 12/07/2021 in [facility]  Referring Provider Donato Heinz, MD        Number of Visits: 26 exercise session, 22 education sessions  Reason for Discharge:  Patient reached a stable level of exercise. Patient independent in their exercise. Patient has met program and personal goals.  Smoking History:  Social History   Tobacco Use  Smoking Status Former   Types: Cigars  Smokeless Tobacco Never    Diagnosis:  09/30/21 NSTEMI  09/30/21 DES RCA  ADL [facility]:   Initial Exercise Prescription:  Initial Exercise Prescription - 12/07/21 1000       Date of Initial Exercise RX and Referring Provider   Date 12/07/21    Referring Provider Donato Heinz, MD    Expected Discharge Date 02/11/22      Bike   Level 1.6    Minutes 15    METs 4.56      NuStep   Level 4    SPM 85    Minutes 15    METs 3.5      Prescription Details   Frequency (times per week) 3    Duration Progress to 30 minutes of continuous aerobic without signs/symptoms of physical distress      Intensity   THRR 40-80% of Max Heartrate 61-122    Ratings of Perceived Exertion 11-13    Perceived Dyspnea 0-4      Progression   Progression Continue to progress workloads to maintain intensity without signs/symptoms of physical distress.      Resistance Training   Training Prescription Yes    Weight 5 lbs    Reps 10-15             Discharge Exercise Prescription (Final Exercise Prescription Changes):  Exercise Prescription Changes - 02/11/22 1017       Response to Exercise   Blood Pressure (Admit) 98/70    Blood Pressure (Exercise) 142/82    Blood Pressure (Exit) 102/62    Heart Rate (Admit) 58 bpm    Heart Rate (Exercise) 105 bpm    Heart Rate (Exit) 64 bpm     Rating of Perceived Exertion (Exercise) 12    Symptoms None    Duration Continue with 30 min of aerobic exercise without signs/symptoms of physical distress.    Intensity THRR unchanged      Progression   Progression Continue to progress workloads to maintain intensity without signs/symptoms of physical distress.    Average METs 5      Resistance Training   Training Prescription Yes    Weight 5 lbs    Reps 10-15    Time 10 Minutes      Interval Training   Interval Training No      Bike   Level 2.1    Minutes 15    METs 5.81      NuStep   Level 7    SPM 87    Minutes 15    METs 4.2      Home Exercise Plan   Plans to continue exercise at Home (comment)   walking, recumbent bike, elliptical machine   Frequency Add 4 additional days to program exercise sessions.    Initial Home Exercises Provided 12/24/21             Functional Capacity:  6  Minute Walk     Row Name 12/07/21 0935 02/02/22 1031       6 Minute Walk   Phase Initial Discharge    Distance 1917 feet 2046 feet    Distance % Change -- 6.73 %    Distance Feet Change -- 129 ft    Walk Time 6 minutes 6 minutes    # of Rest Breaks 0 0    MPH 3.63 3.87    METS 4.5 5.12    RPE 12 13.5    Perceived Dyspnea  0 0    VO2 Peak 15.76 17.94    Symptoms No No    Resting HR 69 bpm 77 bpm    Resting BP 124/72 124/66    Resting Oxygen Saturation  99 % --    Exercise Oxygen Saturation  during 6 min walk 100 % --    Max Ex. HR 96 bpm 115 bpm    Max Ex. BP 140/80 156/82    2 Minute Post BP 112/64 112/82             Psychological, QOL, Others - Outcomes: PHQ 2/9:    02/09/2022   11:10 AM 12/07/2021   10:07 AM 04/15/2021    9:52 AM 04/13/2020    8:28 AM 04/10/2019    8:45 AM  Depression screen PHQ 2/9  Decreased Interest 0 0 0 0 0  Down, Depressed, Hopeless 0 0 0 0 0  PHQ - 2 Score 0 0 0 0 0    Quality of Life:  Quality of Life - 02/02/22 1149       Quality of Life   Select Quality of Life       Quality of Life Scores   Health/Function Pre 21.6 %    Health/Function Post 26.4 %    Health/Function % Change 22.22 %    Socioeconomic Pre 26.14 %    Socioeconomic Post 26.14 %    Socioeconomic % Change  0 %    Psych/Spiritual Pre 26.14 %    Psych/Spiritual Post 30 %    Psych/Spiritual % Change 14.77 %    Family Pre 27.6 %    Family Post 30 %    Family % Change 8.7 %    GLOBAL Pre 24.35 %    GLOBAL Post 27.62 %    GLOBAL % Change 13.43 %             Personal Goals: Goals established at orientation with interventions provided to work toward goal.  Personal Goals and Risk Factors at Admission - 12/07/21 1044       Core Components/Risk Factors/Patient Goals on Admission   Hypertension Yes    Intervention Provide education on lifestyle modifcations including regular physical activity/exercise, weight management, moderate sodium restriction and increased consumption of fresh fruit, vegetables, and low fat dairy, alcohol moderation, and smoking cessation.;Monitor prescription use compliance.    Expected Outcomes Short Term: Continued assessment and intervention until BP is < 140/73m HG in hypertensive participants. < 130/878mHG in hypertensive participants with diabetes, heart failure or chronic kidney disease.;Long Term: Maintenance of blood pressure at goal levels.    Lipids Yes    Intervention Provide education and support for participant on nutrition & aerobic/resistive exercise along with prescribed medications to achieve LDL <7058mHDL >13m78m  Expected Outcomes Short Term: Participant states understanding of desired cholesterol values and is compliant with medications prescribed. Participant is following exercise prescription and nutrition guidelines.;Long Term: Cholesterol controlled with medications  as prescribed, with individualized exercise RX and with personalized nutrition plan. Value goals: LDL < 22m, HDL > 40 mg.              Personal Goals Discharge:   Goals and Risk Factor Review     Row Name 12/13/21 1109 01/04/22 1133 02/01/22 1134 02/09/22 1122       Core Components/Risk Factors/Patient Goals Review   Personal Goals Review Lipids;Hypertension Lipids;Hypertension Lipids;Hypertension Lipids;Hypertension    Review Patient started intensive cardiac rehab today, 12/13/21, he tolerated exercise well, vital signs stable, denies pain. DLinna Hoffhas been doing well with exercise at intensive cardiac rehab and has not reported any complaints or chest pain or discomfort. Vital signs and weights have been stable DLinna Hoffhas been doing well with exercise at intensive cardiac rehab. Vital signs and weights have been stable. DLinna Hoffhas lost 2.4 kg since starting the program. DLinna Hoffwill complete cardiac rehab the end of the month. DLinna Hoffhas been doing well with exercise at intensive cardiac rehab. Vital signs and weights have been stable. DLinna Hoffhas lost 2.4 kg since starting the program. DLinna Hoffwill complete cardiac rehab the end of the month.    Expected Outcomes Patient will maintain healhty lifestyle as he is provided the knowledge and tools neccessary to complete goals; he will increase exercise stamina druing the intensive cardiac rehab program. Patient will maintain healhty lifestyle as he is provided the knowledge and tools neccessary to complete goals; he will increase exercise stamina druing the intensive cardiac rehab program. Patient will maintain healhty lifestyle as he is provided the knowledge and tools neccessary to complete goals; he will increase exercise stamina druing the intensive cardiac rehab program. DLinna Hoffsays he  will maintain healhty lifestyle as he has been  provided the knowledge and tools neccessary to complete goals; DLinna Hoffwill continue to exercise on his own and follow a heart healthy diet. DLinna Hoffsays that his goals for intensive cardiac rehab has been met.             Exercise Goals and Review:  Exercise Goals     Row Name 12/07/21 0949              Exercise Goals   Increase Physical Activity Yes       Intervention Provide advice, education, support and counseling about physical activity/exercise needs.;Develop an individualized exercise prescription for aerobic and resistive training based on initial evaluation findings, risk stratification, comorbidities and participant's personal goals.       Expected Outcomes Short Term: Attend rehab on a regular basis to increase amount of physical activity.;Long Term: Exercising regularly at least 3-5 days a week.;Long Term: Add in home exercise to make exercise part of routine and to increase amount of physical activity.       Increase Strength and Stamina Yes       Intervention Provide advice, education, support and counseling about physical activity/exercise needs.;Develop an individualized exercise prescription for aerobic and resistive training based on initial evaluation findings, risk stratification, comorbidities and participant's personal goals.       Expected Outcomes Short Term: Increase workloads from initial exercise prescription for resistance, speed, and METs.;Short Term: Perform resistance training exercises routinely during rehab and add in resistance training at home;Long Term: Improve cardiorespiratory fitness, muscular endurance and strength as measured by increased METs and functional capacity (6MWT)       Able to understand and use rate of perceived exertion (RPE) scale Yes       Intervention Provide  education and explanation on how to use RPE scale       Expected Outcomes Short Term: Able to use RPE daily in rehab to express subjective intensity level;Long Term:  Able to use RPE to guide intensity level when exercising independently       Knowledge and understanding of Target Heart Rate Range (THRR) Yes       Intervention Provide education and explanation of THRR including how the numbers were predicted and where they are located for reference       Expected Outcomes Short Term: Able to  state/look up THRR;Long Term: Able to use THRR to govern intensity when exercising independently;Short Term: Able to use daily as guideline for intensity in rehab       Able to check pulse independently Yes       Intervention Provide education and demonstration on how to check pulse in carotid and radial arteries.;Review the importance of being able to check your own pulse for safety during independent exercise       Expected Outcomes Short Term: Able to explain why pulse checking is important during independent exercise;Long Term: Able to check pulse independently and accurately       Understanding of Exercise Prescription Yes       Intervention Provide education, explanation, and written materials on patient's individual exercise prescription       Expected Outcomes Short Term: Able to explain program exercise prescription;Long Term: Able to explain home exercise prescription to exercise independently                Exercise Goals Re-Evaluation:  Exercise Goals Re-Evaluation     Row Name 12/13/21 1126 12/24/21 1435 01/19/22 1058 02/11/22 1133       Exercise Goal Re-Evaluation   Exercise Goals Review Increase Physical Activity;Able to understand and use rate of perceived exertion (RPE) scale Increase Physical Activity;Able to understand and use rate of perceived exertion (RPE) scale;Increase Strength and Stamina;Knowledge and understanding of Target Heart Rate Range (THRR);Able to check pulse independently;Understanding of Exercise Prescription Increase Physical Activity;Able to understand and use rate of perceived exertion (RPE) scale;Increase Strength and Stamina;Knowledge and understanding of Target Heart Rate Range (THRR);Able to check pulse independently;Understanding of Exercise Prescription Increase Physical Activity;Able to understand and use rate of perceived exertion (RPE) scale;Increase Strength and Stamina;Knowledge and understanding of Target Heart Rate Range (THRR);Able to check  pulse independently;Understanding of Exercise Prescription    Comments Patient able to understand and use RPE scale appropriately. Reviewed exercise prescription with patient. Patient is currently walking, riding his recumbent bike (RB), and/or elliptical machine 30 minutes 4 days/week as his mode of home exercise. Patient stretches daily and uses light hand weights (3 or 5 lbs) while riding the [place] for his resistance training. Patient has a smart watch to check his pulse. Patient's goal is to eat right, maintain weight, and exercise daily. Continues to make excellent progress with exercise. Patient states he feels stronger but still gets fatigued going up inclines during his walk. Discussed  possibly trying the TM with incline to help with conditioning in this area, and patient will think about this. Patient completed the cardiac rehab program and progressed well achieving 5.0 METs with exercise. Patient's functional capacity increased 7% as measured by 6MWT and strength increased 11% as measured by grip strength test. Patient plans to continue exercise at home using his recumbent bike and elliptical machine. Patient is also looking into buying a dual action bike or recumbent stepper.    Expected  Outcomes Progress workloads as tolerated to help improve cardiorespiratory fitness. Continue to progress workloads as tolerated. Continue daily exercise routine. Continue to progress workloads to help build stamina and endurance. Patient will continue exercise 30-45 minutes 5-7 days/week to maintain health and fitness gains.             Nutrition & Weight - Outcomes:  Pre Biometrics - 12/07/21 0912       Pre Biometrics   Waist Circumference 35.5 inches    Hip Circumference 39 inches    Waist to Hip Ratio 0.91 %    Triceps Skinfold 6 mm    % Body Fat 20 %    Grip Strength 47 kg    Flexibility 12.25 in    Single Leg Stand 30 seconds             Post Biometrics - 02/11/22 1022        Post   Biometrics   Height 6' 1.75" (1.873 m)    Waist Circumference 35.5 inches    Hip Circumference 38.75 inches    Waist to Hip Ratio 0.92 %    BMI (Calculated) 23.49    Triceps Skinfold 6 mm    % Body Fat 19.8 %    Grip Strength 52 kg    Flexibility 11.25 in    Single Leg Stand 30 seconds             Nutrition:  Nutrition Therapy & Goals - 02/11/22 1051       Nutrition Therapy   Diet Heart Healthy Diet    Drug/Food Interactions Statins/Certain Fruits      Personal Nutrition Goals   Nutrition Goal Patient to choose a daily variety of fruits, vegetables, whole grains, lean protein/plant protein, nonfat dairy as part of heart healthy lifestyle    Personal Goal #2 Patient to limit daily sodium intake to <1597m.    Personal Goal #3 Patient to identify and limit daily intake of saturated fat, trans fat, sodium, and refined carbohydrates.    Comments Goals in action. DLinna Hoffcontinues to attend the [employer]and nutrition classes. Per diet recal, he has implemented many Pritikin recipes. He reports better understanding of reading food labels for sodium and sugar, increased fiber intake, and reduced saturated fat intake. DLinna Hoffhas excellent plan for exercise long term. His wife remains supportive of lifestyle changes.      Intervention Plan   Intervention Prescribe, educate and counsel regarding individualized specific dietary modifications aiming towards targeted core components such as weight, hypertension, lipid management, diabetes, heart failure and other comorbidities.    Expected Outcomes Short Term Goal: Understand basic principles of dietary content, such as calories, fat, sodium, cholesterol and nutrients.;Long Term Goal: Adherence to prescribed nutrition plan.             Nutrition Discharge:  Nutrition Assessments - 02/02/22 1043       Rate Your Plate Scores   Post Score 82             Education Questionnaire Score:  Knowledge Questionnaire Score -  02/02/22 1149       Knowledge Questionnaire Score   Pre Score 23/24    Post Score 23/24             Goals reviewed with patient; copy given to patient.Pt graduates from  Intensive/Traditional cardiac rehab program on 02/09/22  with completion of  48 exercise and education sessions. Pt maintained good attendance and progressed nicely during their participation in rehab  as evidenced by increased MET level.   Medication list reconciled. Repeat  PHQ score-  0.  Pt has made significant lifestyle changes and should be commended for their success. Andre Martinez achieved their goals during cardiac rehab.   Pt plans to continue exercise by walking stretching and riding his recumbent bike at home. Andre Martinez says that he feels stronger since participating in the program. Andre Martinez said that he has found it helpful to exercise in a group with other participants who have similar experiences. Andre Martinez says he is following a heart healthy diet and uses no salt. Andre Martinez says the dietary classes and input from Sam the RD has been helpful. Andre Martinez increased his distance on his post exercise walk test by 129 feet. We are proud of Andre Martinez's progress! Harrell Gave RN BSN

## 2022-02-11 ENCOUNTER — Encounter (HOSPITAL_COMMUNITY)
Admission: RE | Admit: 2022-02-11 | Discharge: 2022-02-11 | Disposition: A | Discharge status: Home / Self Care | Payer: Medicare Other | Source: Ambulatory Visit | Attending: Cardiology | Admitting: Cardiology

## 2022-02-11 VITALS — BP 98/70 | HR 58 | Ht 73.75 in | Wt 181.7 lb

## 2022-02-11 DIAGNOSIS — Z955 Presence of coronary angioplasty implant and graft: Secondary | ICD-10-CM

## 2022-02-11 DIAGNOSIS — I214 Non-ST elevation (NSTEMI) myocardial infarction: Secondary | ICD-10-CM

## 2022-02-14 DIAGNOSIS — Z23 Encounter for immunization: Secondary | ICD-10-CM | POA: Diagnosis not present

## 2022-02-22 ENCOUNTER — Encounter: Payer: Self-pay | Admitting: Internal Medicine

## 2022-02-27 ENCOUNTER — Other Ambulatory Visit: Payer: Self-pay | Admitting: Family Medicine

## 2022-02-27 DIAGNOSIS — I10 Essential (primary) hypertension: Secondary | ICD-10-CM

## 2022-03-07 ENCOUNTER — Encounter: Payer: Self-pay | Admitting: Internal Medicine

## 2022-03-07 ENCOUNTER — Other Ambulatory Visit: Payer: Self-pay

## 2022-03-07 DIAGNOSIS — E785 Hyperlipidemia, unspecified: Secondary | ICD-10-CM

## 2022-03-08 NOTE — Telephone Encounter (Signed)
We can switch to Plavix if needed but typically I like to see if we can go through patient assistance to make Brilinta more affordable first.   Thank you! Mairen Wallenstein

## 2022-03-08 NOTE — Telephone Encounter (Signed)
I would like to try patient assistance for Brilinta first if patient is agreeable to that.   Thanks!

## 2022-03-08 NOTE — Telephone Encounter (Signed)
Ok that's fine. We can switch to Plavix. He can finish current prescription of Brilinta. Last dose of Brilinta should be an evening dose. The following morning (day 1), he should take Plavix '300mg'$  (four of the '75mg'$  tablets). The next day (day 2), he should start maintenance dose of '75mg'$  once daily.  Thank you!

## 2022-03-08 NOTE — Telephone Encounter (Signed)
Returned call to pt he states that he would "like to do what is best from the provider" and wanted to try the plavix.

## 2022-03-09 DIAGNOSIS — E785 Hyperlipidemia, unspecified: Secondary | ICD-10-CM | POA: Diagnosis not present

## 2022-03-10 DIAGNOSIS — M1711 Unilateral primary osteoarthritis, right knee: Secondary | ICD-10-CM | POA: Diagnosis not present

## 2022-03-10 LAB — LIPID PANEL
Chol/HDL Ratio: 2.4 ratio (ref 0.0–5.0)
Cholesterol, Total: 113 mg/dL (ref 100–199)
HDL: 48 mg/dL (ref >39)
LDL Chol Calc (NIH): 51 mg/dL (ref 0–99)
Triglycerides: 62 mg/dL (ref 0–149)
VLDL Cholesterol Cal: 14 mg/dL (ref 5–40)

## 2022-03-10 LAB — HEPATIC FUNCTION PANEL
ALT: 43 IU/L (ref 0–44)
AST: 36 IU/L (ref 0–40)
Albumin: 4.2 g/dL (ref 3.9–4.9)
Alkaline Phosphatase: 108 IU/L (ref 44–121)
Bilirubin Total: 0.6 mg/dL (ref 0.0–1.2)
Bilirubin, Direct: 0.2 mg/dL (ref 0.00–0.40)
Total Protein: 6 g/dL (ref 6.0–8.5)

## 2022-03-10 MED ORDER — CLOPIDOGREL BISULFATE 75 MG PO TABS: 75.0000 mg | ORAL_TABLET | Freq: Every day | ORAL | 1 refills | Status: DC

## 2022-03-10 NOTE — Addendum Note (Signed)
Addended by: Waylan Rocher on: 03/10/2022 01:33 PM   Modules accepted: Orders

## 2022-03-18 DIAGNOSIS — M1711 Unilateral primary osteoarthritis, right knee: Secondary | ICD-10-CM | POA: Diagnosis not present

## 2022-03-24 DIAGNOSIS — M1711 Unilateral primary osteoarthritis, right knee: Secondary | ICD-10-CM | POA: Diagnosis not present

## 2022-04-16 ENCOUNTER — Other Ambulatory Visit: Payer: Self-pay | Admitting: Student

## 2022-04-26 DIAGNOSIS — H53143 Visual discomfort, bilateral: Secondary | ICD-10-CM | POA: Diagnosis not present

## 2022-05-02 ENCOUNTER — Ambulatory Visit (INDEPENDENT_AMBULATORY_CARE_PROVIDER_SITE_OTHER): Payer: Medicare Other | Admitting: Family Medicine

## 2022-05-02 ENCOUNTER — Encounter: Payer: Self-pay | Admitting: Family Medicine

## 2022-05-02 VITALS — BP 112/70 | HR 63 | Ht 72.75 in | Wt 167.4 lb

## 2022-05-02 DIAGNOSIS — I10 Essential (primary) hypertension: Secondary | ICD-10-CM | POA: Diagnosis not present

## 2022-05-02 DIAGNOSIS — N529 Male erectile dysfunction, unspecified: Secondary | ICD-10-CM

## 2022-05-02 DIAGNOSIS — I214 Non-ST elevation (NSTEMI) myocardial infarction: Secondary | ICD-10-CM | POA: Diagnosis not present

## 2022-05-02 DIAGNOSIS — K219 Gastro-esophageal reflux disease without esophagitis: Secondary | ICD-10-CM

## 2022-05-02 DIAGNOSIS — M1990 Unspecified osteoarthritis, unspecified site: Secondary | ICD-10-CM | POA: Diagnosis not present

## 2022-05-02 DIAGNOSIS — Z0000 Encounter for general adult medical examination without abnormal findings: Secondary | ICD-10-CM

## 2022-05-02 DIAGNOSIS — E785 Hyperlipidemia, unspecified: Secondary | ICD-10-CM | POA: Diagnosis not present

## 2022-05-02 DIAGNOSIS — Z1211 Encounter for screening for malignant neoplasm of colon: Secondary | ICD-10-CM

## 2022-05-02 DIAGNOSIS — Z125 Encounter for screening for malignant neoplasm of prostate: Secondary | ICD-10-CM

## 2022-05-02 DIAGNOSIS — Z96652 Presence of left artificial knee joint: Secondary | ICD-10-CM | POA: Diagnosis not present

## 2022-05-02 DIAGNOSIS — Z23 Encounter for immunization: Secondary | ICD-10-CM

## 2022-05-02 NOTE — Progress Notes (Signed)
Andre Martinez is a 66 y.o. male who presents for annual wellness visit ,CPE and follow-up on chronic medical conditions. He is doing quite well.  Continues to his cardiac conditioning program and is now doing it on his own.  He did have an NSTEMI.  He is followed by cardiology for that.  He did make dietary changes and is continued with his exercise regimen.  He has lost a significant amount of weight.  He still has reflux symptoms and is using Protonix for that.  He mainly gets a burning sensation which did create some problems as the burning from the cardiac event was similar.  He has had a TKR and does have some arthritic changes otherwise but seems to be doing well and continues to be very physically active.  Does have underlying ED but has had no concerns recently.  Recent blood work did show slightly low calcium. Immunizations and Health Maintenance Immunization History  Administered Date(s) Administered   COVID-19, mRNA, vaccine(Comirnaty)12 years and older 04/06/2022   Fluad Quad(high Dose 65+) 02/14/2022   Influenza Split 02/19/2013   Influenza, Quadrivalent, Recombinant, Inj, Pf 02/19/2019   Influenza,inj,Quad PF,6+ Mos 01/16/2014   Influenza-Unspecified 03/15/2016, 03/28/2017, 02/08/2018, 02/19/2019, 02/10/2021   PFIZER Comirnaty(Gray Top)Covid-19 Tri-Sucrose Vaccine 02/12/2020, 11/17/2020   PFIZER(Purple Top)SARS-COV-2 Vaccination 06/07/2019, 06/28/2019   Pneumococcal Conjugate-13 04/10/2019   Pneumococcal Polysaccharide-23 04/13/2020   Tdap 04/02/2001, 10/18/2011   Zoster Recombinat (Shingrix) 03/06/2018, 05/07/2018   Zoster, Live 04/21/2014   Health Maintenance Due  Topic Date Due   COLONOSCOPY (Pts 45-33yr Insurance coverage will need to be confirmed)  02/24/2021   DTaP/Tdap/Td (3 - Td or Tdap) 10/17/2021   Medicare Annual Wellness (AWV)  04/15/2022     Last colonoscopy:?  [facility].  Dentist: NRoxan DieselSabra Heckvision Exercise:cardiac rehab  on his own  Other doctors caring for patient include:Shumann  Advanced directives:no . Info given    Depression screen:  See questionnaire below.     05/02/2022    9:20 AM 02/09/2022   11:10 AM 12/07/2021   10:07 AM 04/15/2021    9:52 AM 04/13/2020    8:28 AM  Depression screen PHQ 2/9  Decreased Interest 0 0 0 0 0  Down, Depressed, Hopeless 0 0 0 0 0  PHQ - 2 Score 0 0 0 0 0    Fall Risk Screen: see questionnaire below.    05/02/2022    9:20 AM 12/07/2021    9:47 AM 04/15/2021    9:51 AM 04/13/2020    8:27 AM 04/10/2019    8:44 AM  Fall Risk   Falls in the past year? 0 0 0 0 0  Number falls in past yr: 0  0    Injury with Fall? 0  0    Risk for fall due to : No Fall Risks No Fall Risks No Fall Risks    Follow up Falls evaluation completed Falls evaluation completed Falls evaluation completed      ADL screen:  See questionnaire below Functional Status Survey: Is the patient deaf or have difficulty hearing?: No Does the patient have difficulty seeing, even when wearing glasses/contacts?: No Does the patient have difficulty concentrating, remembering, or making decisions?: No Does the patient have difficulty walking or climbing stairs?: No Does the patient have difficulty dressing or bathing?: No Does the patient have difficulty doing errands alone such as visiting a doctor's office or shopping?: No   Review of Systems Constitutional: -, -unexpected weight change, -  anorexia, -fatigue Allergy: -sneezing, -itching, -congestion Dermatology: denies changing moles, rash, lumps ENT: -runny nose, -ear pain, -sore throat,  Cardiology:  -chest pain, -palpitations, -orthopnea, Respiratory: -cough, -shortness of breath, -dyspnea on exertion, -wheezing,  Gastroenterology: -abdominal pain, -nausea, -vomiting, -diarrhea, -constipation, -dysphagia Hematology: -bleeding or bruising problems Musculoskeletal: -arthralgias, -myalgias, -joint swelling, -back pain, - Ophthalmology:  -vision changes,  Urology: -dysuria, -difficulty urinating,  -urinary frequency, -urgency, incontinence Neurology: -, -numbness, , -memory loss, -falls, -dizziness    PHYSICAL EXAM:  BP 112/70   Pulse 63   Ht 6' 0.75" (1.848 m)   Wt 167 lb 6.4 oz (75.9 kg)   SpO2 98% Comment: room air  BMI 22.24 kg/m   General Appearance: Alert, cooperative, no distress, appears stated age Head: Normocephalic, without obvious abnormality, atraumatic Eyes: PERRL, conjunctiva/corneas clear, EOM's intact,  Ears: Normal TM's and external ear canals Nose: Nares normal, mucosa normal, no drainage or sinus tenderness Throat: Lips, mucosa, and tongue normal; teeth and gums normal Neck: Supple, no lymphadenopathy;  thyroid:  no enlargement/tenderness/nodules; no carotid bruit or JVD Lungs: Clear to auscultation bilaterally without wheezes, rales or ronchi; respirations unlabored Heart: Regular rate and rhythm, S1 and S2 normal, no murmur, rubor gallop Abdomen: Soft, non-tender, nondistended, normoactive bowel sounds,  no masses, no hepatosplenomegaly Extremities: No clubbing, cyanosis or edema Skin:  Skin color, texture, turgor normal, no rashes or lesions Lymph nodes: Cervical, supraclavicular, and axillary nodes normal Neurologic:  CNII-XII intact, normal strength, sensation and gait; reflexes 2+ and symmetric throughout Psych: Normal mood, affect, hygiene and grooming.  ASSESSMENT/PLAN: Routine general medical examination at a health care facility  Non-ST elevation (NSTEMI) myocardial infarction [facility])  Essential hypertension  Gastroesophageal reflux disease, unspecified whether esophagitis present  Arthritis  Status post total left knee replacement  Hyperlipidemia, unspecified hyperlipidemia type  Erectile dysfunction, unspecified erectile dysfunction type  Hypocalcemia - Plan: Comprehensive metabolic panel  Need for GNFAO-13 vaccine  Need for influenza vaccination  Screening for  prostate cancer - Plan: PSA He is interested in switching to a different cardiologist and I informed him to go ahead and try and if he runs in the difficulty call me and I will help with this.  He is also discussed continuing Brilinta versus switching to Plavix as he still is concerned about which is the best for long-term benefit.  Otherwise he will continue on present medication regimen. .  Immunization recommendations discussed.  Colonoscopy recommendations reviewed.  He thinks he had a colonoscopy although I do not have a record of that.  I will attempt to get the information from [facility].   Medicare Attestation I have personally reviewed: The patient's medical and social history Their use of alcohol, tobacco or illicit drugs Their current medications and supplements The patient's functional ability including ADLs,fall risks, home safety risks, cognitive, and hearing and visual impairment Diet and physical activities Evidence for depression or mood disorders  The patient's weight, height, and BMI have been recorded in the chart.  I have made referrals, counseling, and provided education to the patient based on review of the above and I have provided the patient with a written personalized care plan for preventive services.     Jill Alexanders, MD   05/02/2022

## 2022-05-03 ENCOUNTER — Encounter: Payer: Self-pay | Admitting: Family Medicine

## 2022-05-03 LAB — COMPREHENSIVE METABOLIC PANEL
ALT: 19 IU/L (ref 0–44)
AST: 20 IU/L (ref 0–40)
Albumin/Globulin Ratio: 2.1 (ref 1.2–2.2)
Albumin: 4.2 g/dL (ref 3.9–4.9)
Alkaline Phosphatase: 115 IU/L (ref 44–121)
BUN/Creatinine Ratio: 16 (ref 10–24)
BUN: 15 mg/dL (ref 8–27)
Bilirubin Total: 0.5 mg/dL (ref 0.0–1.2)
CO2: 26 mmol/L (ref 20–29)
Calcium: 9.7 mg/dL (ref 8.6–10.2)
Chloride: 103 mmol/L (ref 96–106)
Creatinine, Ser: 0.95 mg/dL (ref 0.76–1.27)
Globulin, Total: 2 g/dL (ref 1.5–4.5)
Glucose: 92 mg/dL (ref 70–99)
Potassium: 5.5 mmol/L — ABNORMAL HIGH (ref 3.5–5.2)
Sodium: 140 mmol/L (ref 134–144)
Total Protein: 6.2 g/dL (ref 6.0–8.5)
eGFR: 87 mL/min/{1.73_m2} (ref >59)

## 2022-05-03 LAB — PSA: Prostate Specific Ag, Serum: 0.6 ng/mL (ref 0.0–4.0)

## 2022-05-04 DIAGNOSIS — K317 Polyp of stomach and duodenum: Secondary | ICD-10-CM

## 2022-05-12 IMAGING — RF DG ESOPHAGUS
3 series · 15 of 24 positions shown · non-contrast
Comparison: None.

CLINICAL DATA: Dysphagia, pain with swallowing.

EXAM:
ESOPHOGRAM / BARIUM SWALLOW / BARIUM TABLET STUDY
TECHNIQUE: Combined double contrast and single contrast examination performed
using effervescent crystals, thick barium liquid, and thin barium
liquid. The patient was observed with fluoroscopy swallowing a 13 mm
barium sulphate tablet.
FLUOROSCOPY TIME:  Fluoroscopy Time:  2 minute 12 seconds
Radiation Exposure Index (if provided by the fluoroscopic device):
30.1 mGy
Number of Acquired Spot Images: 13

[Series 1: sequence · 0.31mm/px · 2 of 6 frames shown (1 of 2)]
[frame 1/6]
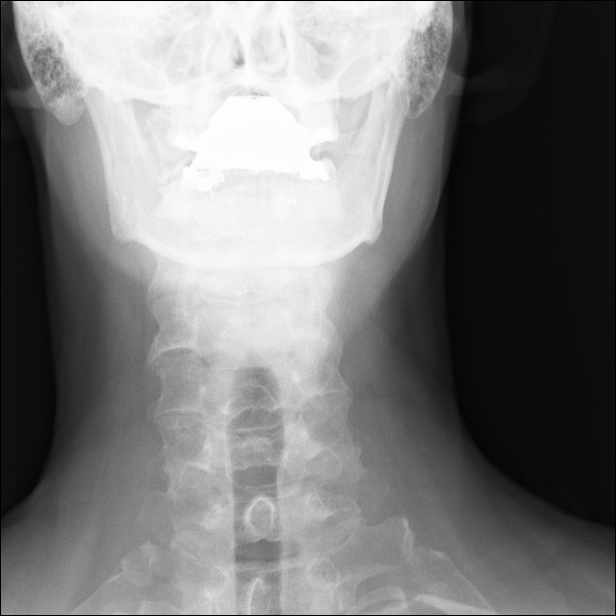
[frame 6/6]
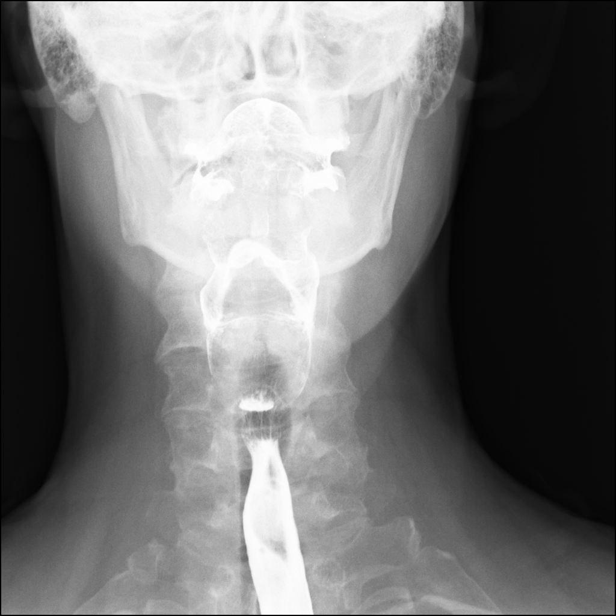

[Series 3: sequence · 0.31mm/px · 2 of 7 frames shown (2 of 2)]
[frame 4/7]
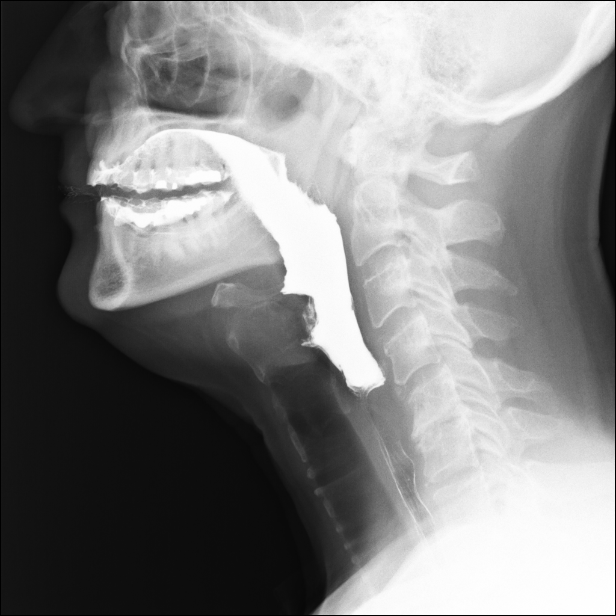
[frame 6/7]
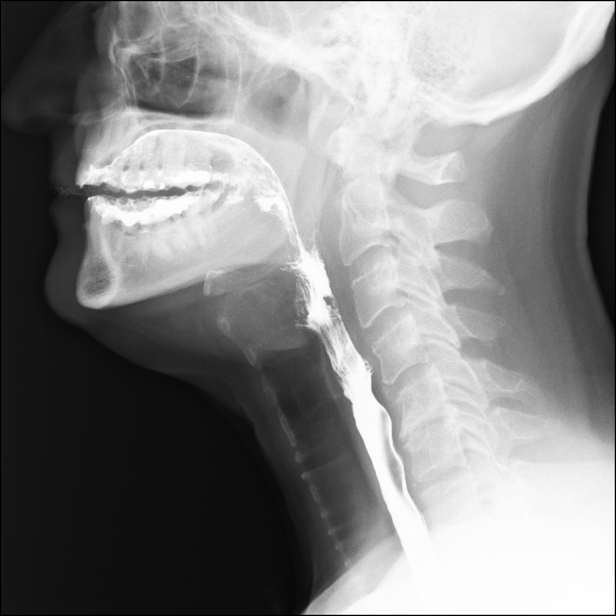

[Series 4: one shot · 11 of 33 slices shown]
[im 2/33]
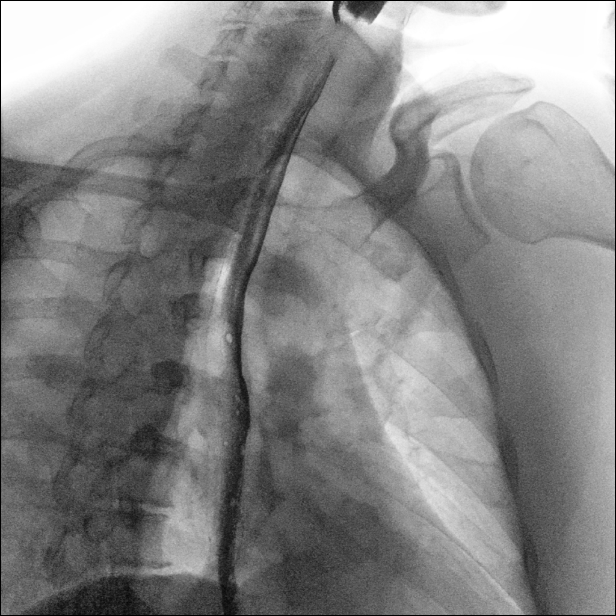
[im 4/33]
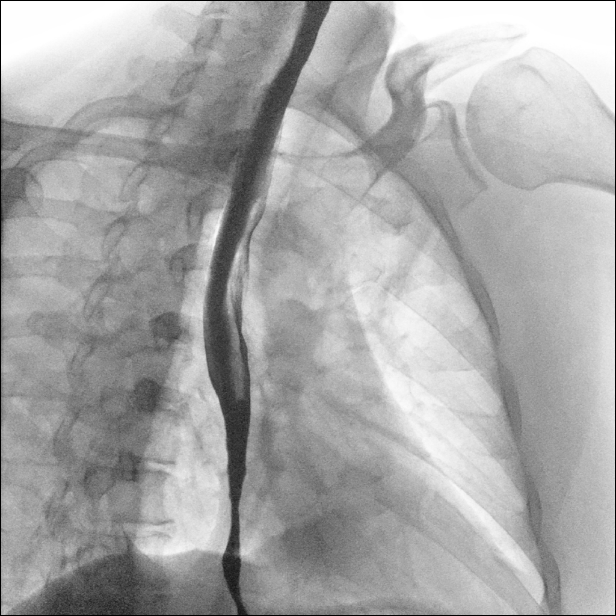
[im 9/33]
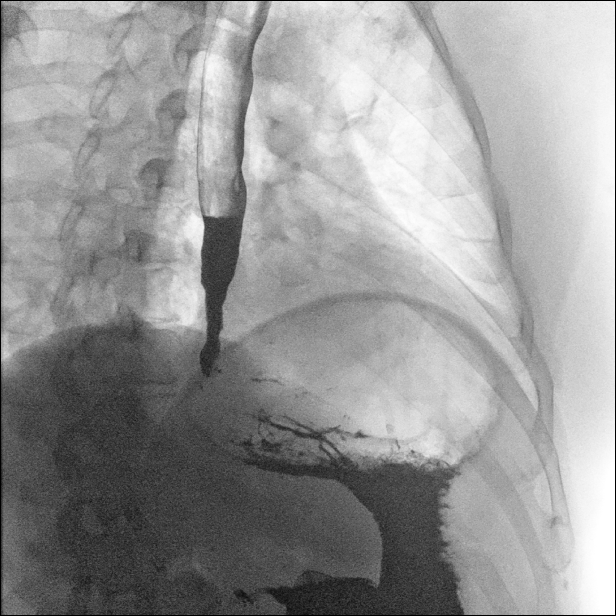
[im 12/33]
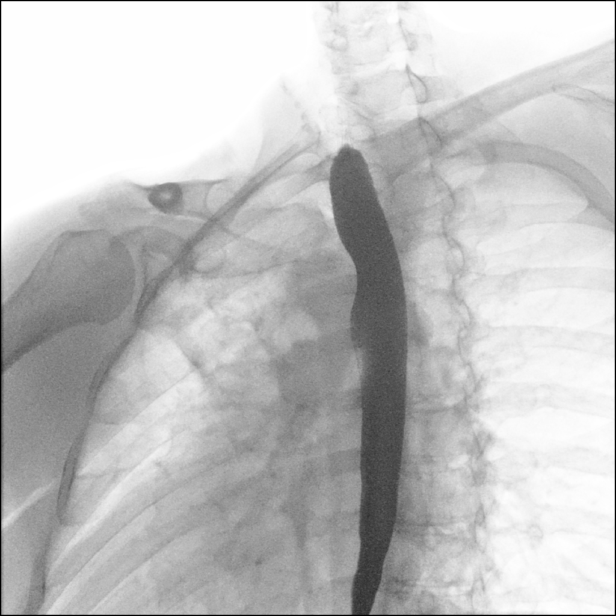
[im 13/33]
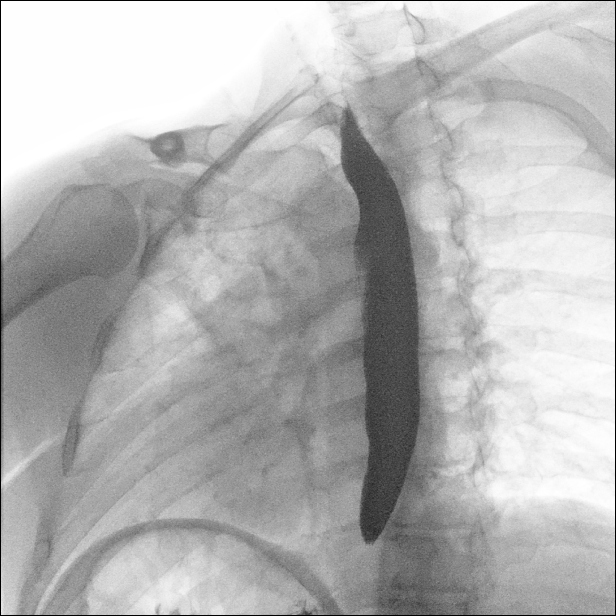
[im 18/33]
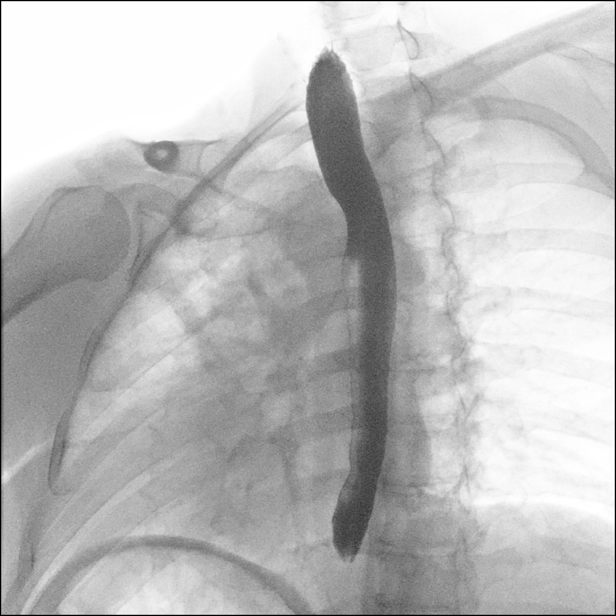
[im 20/33]
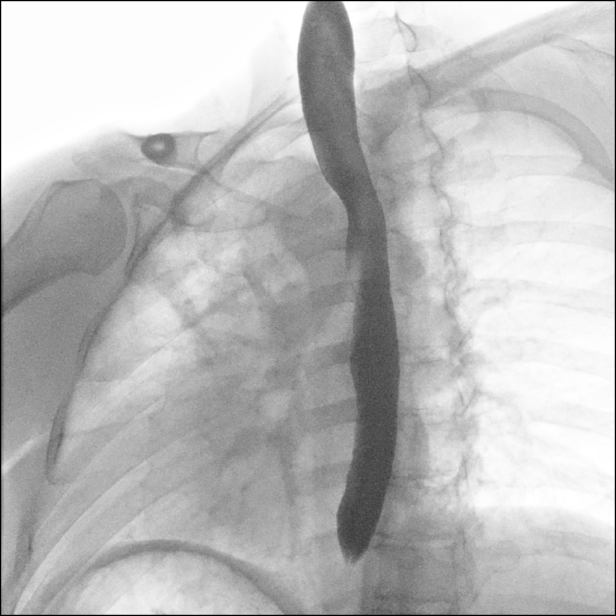
[im 23/33]
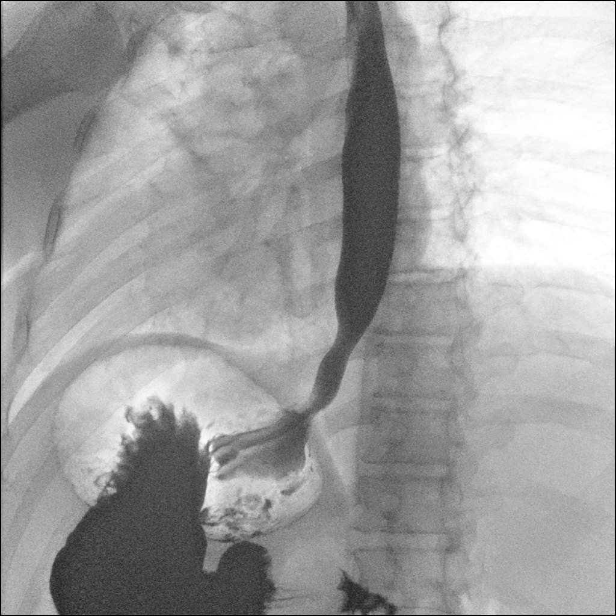
[im 26/33]
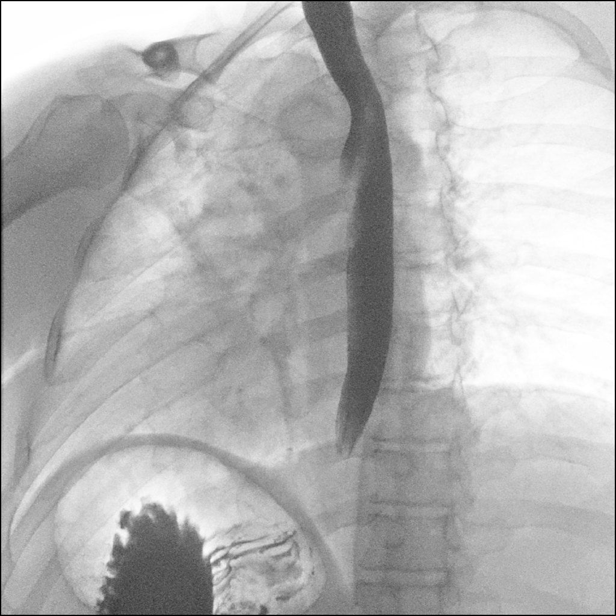
[im 29/33]
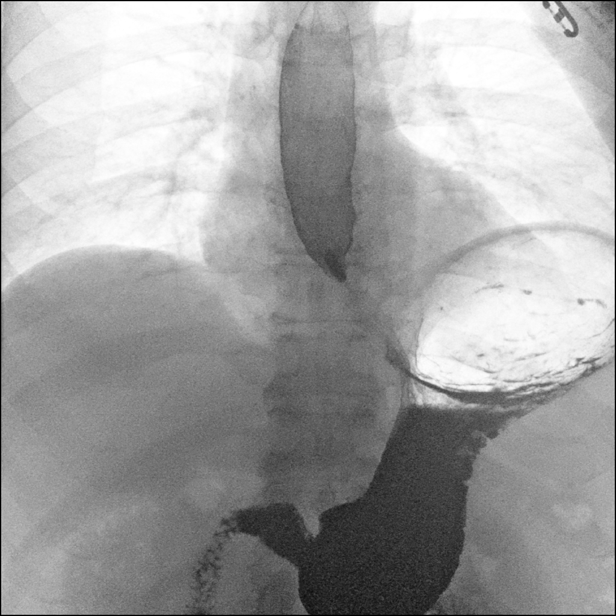
[im 33/33]
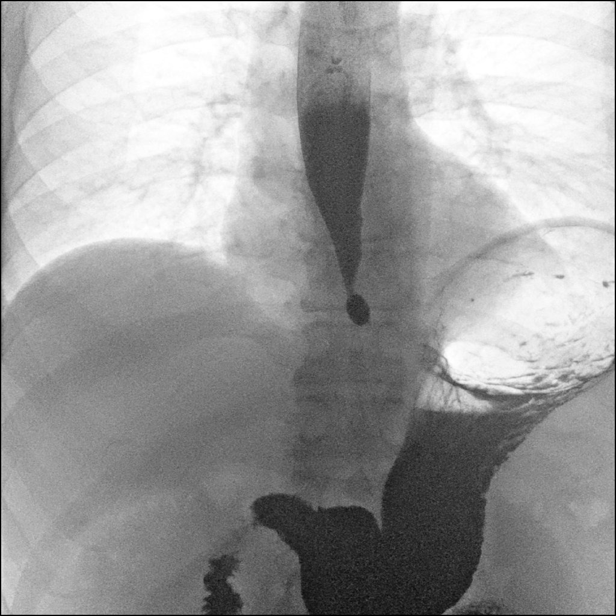

[15 of 24 positions shown; findings below may reference images not displayed]

FINDINGS: Normal swallowing mechanism. Poor esophageal motility with stasis of
contrast in the esophagus. Short segment narrowing in the distal
esophagus through which a 13 mm barium tablet would not pass. No
esophageal fold thickening.
IMPRESSION: 1. Short segment stricture in the distal esophagus, through which a
13 mm barium tablet would not pass.
2. Esophageal dysmotility.

## 2022-05-13 ENCOUNTER — Telehealth: Payer: Self-pay | Admitting: Student

## 2022-05-13 MED ORDER — CLOPIDOGREL BISULFATE 75 MG PO TABS: 75.0000 mg | ORAL_TABLET | Freq: Every day | ORAL | 1 refills | Status: DC

## 2022-05-13 NOTE — Telephone Encounter (Signed)
*  STAT* If patient is at the pharmacy, call can be transferred to refill team.   1. Which medications need to be refilled? (please list name of each medication and dose if known)   clopidogrel (PLAVIX) 75 MG tablet    2. Which pharmacy/location (including street and city if local pharmacy) is medication to be sent to? [facility] - [place], [place]    [facility] ([place], [place]   3. Do they need a 30 day or 90 day supply?   Pt is out of medication he would like enough sent to [facility] until 90 day supply from [facility] comes in.  Pt would also like a callback once refill request is sent in

## 2022-05-17 ENCOUNTER — Telehealth: Payer: Self-pay | Admitting: Student

## 2022-05-17 MED ORDER — CLOPIDOGREL BISULFATE 75 MG PO TABS: 75.0000 mg | ORAL_TABLET | Freq: Every day | ORAL | 0 refills | Status: DC

## 2022-05-17 NOTE — Telephone Encounter (Signed)
Spoke with [facility] regarding prescription refill for plavix. Refill had been sent in with instructions for a loading dose on the first day. [facility] will remove these instructions before sending prescription to pt.   Called pt to send in supply of plavix to local pharmacy. Pt would like a 30 day supply sent to pharmacy of choice. Pt mentions that he has not transitioned off of Brilinta yet because he never received the first prescription. Instructed pt that he would need to do a loading dose of plavix per Lurline Hare, PA. Pt will take 4 tablets ('300mg'$ ) total on the first day and then 1 tablet ('75mg'$ ) once daily moving forward. Pt verbalizes understanding.

## 2022-05-17 NOTE — Telephone Encounter (Signed)
Pt c/o medication issue:  1. Name of Medication:  clopidogrel (PLAVIX) 75 MG tablet   2. How are you currently taking this medication (dosage and times per day)?    3. Are you having a reaction (difficulty breathing--STAT)?   4. What is your medication issue? Patient would like a script to be sent to [place], [place] to hold him over until he receives the [facility] order.  He also stated [facility] sent him a note stating they can not fill the prescription until we call them.  Patient only has a couple of days left. Patient would like a call back when this is completed.

## 2022-05-24 ENCOUNTER — Other Ambulatory Visit: Payer: Self-pay | Admitting: Family Medicine

## 2022-05-24 DIAGNOSIS — I10 Essential (primary) hypertension: Secondary | ICD-10-CM

## 2022-06-16 NOTE — Telephone Encounter (Signed)
I reviewed his last lipid panel in 02/2022 and his cholesterol looked great. I normally only check lipids once a year (unless there has been adjustments to cholesterol medications since labs were last checked). Therefore, I don't think he needs his lipids rechecked right now. We can check them if he really wants [place] to, but I don't think it is necessary.   Thank you!

## 2022-06-19 NOTE — Progress Notes (Signed)
Cardiology Office Note:    Date:  06/28/2022   ID:  Andre Martinez, DOB Aug 26, 1955, MRN [id number]  PCP:  Denita Lung, MD  Cardiologist:  Donato Heinz, MD  Electrophysiologist:  None   Referring MD: Denita Lung, MD   Chief Complaint: follow-up of CAD  History of Present Illness:    Andre Martinez is a 67 y.o. male with a history of  history of CAD with recent NSTEMI s/p DES to RCA on 09/30/2021, PVCs, hypertension, hyperlipidemia, and GERD who is followed by Dr. Gardiner Rhyme and presents today for routine follow-up of CAD.   Patient was first seen by Cardiology during hospitalization in 09/2021 for NSTEMI after presenting with sudden onset of burning chest pain that radiated to both arms. LHC showed 50% stenosis of mid RCA followed by 90% stenosis of distal RCA but otherwise no CAD. He underwent successful PCI with DES to distal RCA lesion. Echo showed LVEF of 60-65% with normal wall motion and normal diastolic parameters. He was started on DAPT with Aspirin and Brilinta and home Lipitor was increased. He was noted to have some sinus bradycardia with short runs of NSVT. He was continued on home dose of Toprol-XL and a live monitor was placed prior to discharge. He was seen by me later that month for follow-up at which time he was doing relatively well. He was still having some daily burning in hist chest that was worse after meals and better with exercise. He stated this felt like his chronic reflux and not like his recent MI. He was still wearing his heart monitor at that time. His Omeprazole was stopped and he was started on Protonix instead. Following visit, monitor resulted and showed underlying sinus rhythm with 1 short run of non-sustained VT and 2 short runs of SVT as well as rare PACs/PVCs but no concerning arrhythmias. Patient triggered events corresponded with PVCs.  He was last seen by me in 12/2021 at which time he reported ongoing burning sensation in his chest  basically every day. Symptoms improved with exertion. He has a long history of reflux and reported this felt like his typical indigestion.  He also reported some dysphagia and said he had previously be told he had esophageal spasms. Symptoms sounded very atypical and likely GI in nature. However, he also presented with severe chest burning at the time of his NSTEMI in [id number] complicating the picture some. He was started on low dose Imdur for possible microvascular disease as well as esophageal spasm. He was advised to follow-up with GI if the Imdur did not help. He was ultimately unable to tolerate Imdur and was started on low dose Amlodipine instead.  Patient presents today for follow-up. Here with wife. Patient overall doing well. He still has occasional episodes of mild chest burning but this is much improved on the Amlodipine. He reports 2-3 episodes of chest burning per month which is much improved since last visit. He reports one episode of chest burning when walking with his wife that quickly improved with rest but this is the only episode of exertion chest discomfort that he has had. All other episodes have occurred with rest and often after eating. He does have known reflux but has not followed up with his GI physician yet. He denies any severe chest burning like what he had with his NSTEMI last year. No shortness of breath, orthopnea, PND, edema. He notes occasional mild lightheadedness with quick position changes but this typically resolves quickly.  No palpitations, falls, or syncope. He does state that his hands and feet have been called since starting DAPT. He also occasionally has numbness in his hands if he lays a certain way at night but this improves if he straightens out his arm. He reports he has lost about 30 lbs since his MI in 09/2021 but he made significant diet changes following this. His weight is now starting to flatten out. His weight is up about 6 lbs since 04/2022. He denies any night  sweats or abnormal bleeding in urine or stools. He discussed this with his PCP in 04/2022 and it sounds like it was felt to be due to lifestyle changes.   Past Medical History:  Diagnosis Date   Allergy    Arthritis    Coronary artery disease    GERD (gastroesophageal reflux disease)    Hemorrhoids    Hyperlipidemia    Hypertension    Sinusitis 12/94 3/95    Past Surgical History:  Procedure Laterality Date   CARDIAC CATHETERIZATION     COLONOSCOPY  2006   DR.HAYES   CORONARY STENT INTERVENTION N/A 09/30/2021   Procedure: CORONARY STENT INTERVENTION;  Surgeon: Jettie Booze, MD;  Location: [place] [facility];  Service: Cardiovascular;  Laterality: N/A;   LEFT HEART CATH AND CORONARY ANGIOGRAPHY N/A 09/30/2021   Procedure: LEFT HEART CATH AND CORONARY ANGIOGRAPHY;  Surgeon: Jettie Booze, MD;  Location: [place] [facility];  Service: Cardiovascular;  Laterality: N/A;   rectal fissure     REPAIR   SKIN BIOPSY Left 07/25/2018   see report in chat   SKIN BIOPSY Left 07/08/2021   dysplastic compound nevs with severe atypia margin close   SKIN BIOPSY Left 11/04/2021   no residual dysplastic nevu   TONSILLECTOMY     TOTAL KNEE ARTHROPLASTY Left 05/21/2018   Procedure: LEFT TOTAL KNEE ARTHROPLASTY;  Surgeon: Gaynelle Arabian, MD;  Location: [facility];  Service: Orthopedics;  Laterality: Left;  48mn   UPPER GI ENDOSCOPY  04/08/2013   normal esophagus,multiple gastric polyps,normal examined duodenum    Current Medications: Current Meds  Medication Sig   amLODipine (NORVASC) 2.5 MG tablet TAKE 1 TABLET BY MOUTH ONCE  DAILY   aspirin 81 MG chewable tablet Chew 1 tablet (81 mg total) by mouth daily.   atorvastatin (LIPITOR) 40 MG tablet Take 1 tablet (40 mg total) by mouth daily.   clopidogrel (PLAVIX) 75 MG tablet Take 1 tablet (75 mg total) by mouth daily.   diclofenac Sodium (VOLTAREN) 1 % GEL Apply 1 Application topically 4 (four) times daily as needed (arthritis pain  (knee)).   metoprolol succinate (TOPROL-XL) 50 MG 24 hr tablet TAKE 1 TABLET BY MOUTH DAILY  WITH OR IMMEDIATELY FOLLOWING A  MEAL   Multiple Vitamin (MULTIVITAMIN) tablet Take 1 tablet by mouth in the morning. Centrum Silver   nitroGLYCERIN (NITROSTAT) 0.4 MG SL tablet Place 1 tablet (0.4 mg total) under the tongue every 5 (five) minutes x 3 doses as needed for chest pain.   pantoprazole (PROTONIX) 40 MG tablet Take 1 tablet (40 mg total) by mouth daily.   Polyethylene Glycol 400 (BLINK TEARS) 0.25 % SOLN Place 1-2 drops into both eyes 3 (three) times daily as needed (dry/irritated eyes.).     Allergies:   Aciphex [rabeprazole], Codeine, Prevacid [lansoprazole], and Penicillins   Social History   Socioeconomic History   Marital status: Married    Spouse name: Not on file   Number of children: Not on  file   Years of education: Not on file   Highest education level: Master's degree (e.g., MA, MS, MEng, MEd, MSW, MBA)  Occupational History   Not on file  Tobacco Use   Smoking status: Former    Types: Cigars   Smokeless tobacco: Never  Vaping Use   Vaping Use: Never used  Substance and Sexual Activity   Alcohol use: Yes    Alcohol/week: 3.0 standard drinks of alcohol    Types: 3 Cans of beer per week    Comment: occasionaly   Drug use: No   Sexual activity: Yes  Other Topics Concern   Not on file  Social History Narrative   Not on file   Social Determinants of Health   Financial Resource Strain: Not on file  Food Insecurity: Not on file  Transportation Needs: Not on file  Physical Activity: Not on file  Stress: Not on file  Social Connections: Not on file     Family History: The patient's family history includes Diabetes in his mother; Heart disease in his father and paternal grandfather; Hypertension in his father and mother.  ROS:   Please see the history of present illness.     EKGs/Labs/Other Studies Reviewed:    The following studies were reviewed  today:  Left Cardiac Catheterization 09/30/2021:   Mid RCA lesion is 50% stenosed.   Dist RCA lesion is 90% stenosed.  A drug-eluting stent was successfully placed using a SYNERGY XD 2.50X16, postdilated to 3.0 mm.   Post intervention, there is a 0% residual stenosis.   The left ventricular systolic function is normal.   LV end diastolic pressure is normal.   The left ventricular ejection fraction is 55-65% by visual estimate.   There is no aortic valve stenosis.   Continue dual antiplatelet therapy along with aggressive secondary prevention.  He will need high-dose statin, healthy diet and regular exercise.   Results called to the wife.     Diagnostic Dominance: Right  Intervention       _______________   Echocardiogram 10/01/2021: Impressions: 1. Left ventricular ejection fraction, by estimation, is 60 to 65%. The  left ventricle has normal function. The left ventricle has no regional  wall motion abnormalities. Left ventricular diastolic parameters were  normal.   2. Right ventricular systolic function is normal. The right ventricular  size is normal.   3. The mitral valve is normal in structure. No evidence of mitral valve  regurgitation. No evidence of mitral stenosis.   4. The aortic valve is tricuspid. There is mild calcification of the  aortic valve. There is mild thickening of the aortic valve. Aortic valve  regurgitation is not visualized. Aortic valve sclerosis is present, with  no evidence of aortic valve stenosis.   5. The inferior vena cava is normal in size with greater than 50%  respiratory variability, suggesting right atrial pressure of 3 mmHg.  _______________   Elwyn Reach Monitor 10/01/2021 to 10/15/2021: 1 episode of NSVT lasting 6 beats 2 episodes of SVT, longest lasting 5 beats   Patient had a min HR of 46 bpm, max HR of 148 bpm, and avg HR of 63 bpm. Predominant underlying rhythm was Sinus Rhythm. 1 run of Ventricular Tachycardia occurred lasting 6 beats  with a max rate of 148 bpm (avg 123 bpm). 2 Supraventricular Tachycardia  runs occurred, the run with the fastest interval lasting 5 beats with a max rate of 133 bpm (avg 118 bpm); the run with the fastest interval was  also the longest. Isolated SVEs were rare (<1.0%), SVE Couplets were rare (<1.0%), and SVE Triplets were rare  (<1.0%). Isolated VEs were rare (<1.0%), VE Couplets were rare (<1.0%), and no VE Triplets were present.  Patient triggered events corresponded to sinus rhythm  PVCs  EKG:  EKG not ordered today.   Recent Labs: 10/01/2021: Hemoglobin 13.3; Platelets 253 05/02/2022: ALT 19; BUN 15; Creatinine, Ser 0.95; Potassium 5.5; Sodium 140  Recent Lipid Panel    Component Value Date/Time   CHOL 113 03/09/2022 0819   TRIG 62 03/09/2022 0819   HDL 48 03/09/2022 0819   CHOLHDL 2.4 03/09/2022 0819   CHOLHDL 3.1 10/01/2021 0152   VLDL 21 10/01/2021 0152   LDLCALC 51 03/09/2022 0819    Physical Exam:    Vital Signs: BP 120/78   Pulse 62   Ht 6' 2"$  (1.88 m)   Wt 173 lb (78.5 kg)   SpO2 96%   BMI 22.21 kg/m     Wt Readings from Last 3 Encounters:  06/28/22 173 lb (78.5 kg)  05/02/22 167 lb 6.4 oz (75.9 kg)  02/11/22 181 lb 10.5 oz (82.4 kg)     General: 67 y.o. thin Caucasian male in no acute distress. HEENT: Normocephalic and atraumatic. Sclera clear.  Neck: Supple. No carotid bruits. No JVD. Heart: RRR. Distinct S1 and S2. No murmurs, gallops, or rubs. Radial pulses 2+ and equal bilaterally. Lungs: No increased work of breathing. Clear to ausculation bilaterally. No wheezes, rhonchi, or rales.  Abdomen: Soft, non-distended, and non-tender to palpation.  Extremities: No lower extremity edema.    Skin: Warm and dry. Neuro: Alert and oriented x3. No focal deficits. Psych: Normal affect. Responds appropriately.  Assessment:    1. Coronary artery disease involving native coronary artery of native heart without angina pectoris   2. Sinus bradycardia   3. NSVT  (nonsustained ventricular tachycardia) ([place])   4. Primary hypertension   5. Hyperlipidemia, unspecified hyperlipidemia type   6. Hyperkalemia     Plan:    CAD Patient was admitted in 09/2021 with NSTEMI s/p DES to distal RCA.  - He continues to have occasional chest burning about 2-3 times per month but this is much improved after starting Amlodipine. He has a long history of indigestion that he describes as chest burning. However, he also presented with severe chest burning at the time of his NSTEMI. He has been seen by GI in the past and was told he may have esophageal spasms. He reports one episode of mild chest burning with exertion that improved quickly with rest but all other episodes have occurred at rest. He states this was not like the severe burning he had at the time of his MI.  - Continue Amlodipine 2.45m daily and Toprol-XL 564mdaily. He was unable to tolerate Imdur.  - Continue DAPT with Aspirin and Plavix.  - Continue Lipitor 4062maily. - It is somewhat difficult to tease out whether symptoms are more GI in nature or cardiac in nature. Symptoms at last visit sounded more GI in nature. However, he does report one episode of exertional chest burning that improved with rest today. This was an isolated event. Resistant to increase Amlodipine as patient typically has soft BP. We could consider adding Ranexa in the future for possible microvascular disease if needed but decision was made to hold off on this for now given symptoms have overall improved from last visit. Also advised patient to follow-up with GI. Patient will let us [place]  if symptoms worsen.   Sinus Bradycardia Frequent PVCs Non-Sustained VT During admission in 09/2021, he was noted to have sinus bradycardia, frequent PVCc, and non-sustained VT on telemetry. Zio monitor showed underlying sinus rhythm with average heart rate of 63 bpm), 1 short run of non-sustained VT, and 2 short runs of SVT as well as rare PACs/PVCs but no  concerning arrhythmias. Patient triggered events corresponded with PVCs.  - No recent palpitations.  - Continue Toprol-XL 86m daily.    Hypertension BP well controlled. - Continue Amlodipine 2.523mdaily and Toprol-XL 5066maily.    Hyperlipidemia Lipid panel in 02/2022: Total Cholesterol 113, Triglycerides 62, HDL 48, LDL 51. Lipoprotein A 205.5. LDL goal <70 given CAD.  - Continue Lipitor 23m31mily.   Hyperkalemia Potassium was 5.5 on last labs from PCP's office in 04/2022. He takes a multivitamin but otherwise is not on any medications that could cause hyperkalemia. - Will recheck BMET today.  Disposition: Follow up in 6 months.   Medication Adjustments/Labs and Tests Ordered: Current medicines are reviewed at length with the patient today.  Concerns regarding medicines are outlined above.  Orders Placed This Encounter  Procedures   Basic metabolic panel   No orders of the defined types were placed in this encounter.   Patient Instructions  Medication Instructions:   Your physician recommends that you continue on your current medications as directed. Please refer to the Current Medication list given to you today.  *If you need a refill on your cardiac medications before your next appointment, please call your pharmacy*  Lab Work: Your physician recommends that you return for lab work TODAY:  BMP  If you have labs (blood work) drawn today and your tests are completely normal, you will receive your results only by: [facility] you have [place] paper copy in the mail If you have any lab test that is abnormal or we need to change your treatment, we will call you to review the results.  Testing/Procedures: NONE ordered at this time of appointment   Follow-Up: At [facility] and your health needs are our priority.  As part of our continuing mission to provide you with exceptional heart care, we have created designated Provider Care Teams.   These Care Teams include your primary Cardiologist (physician) and Advanced Practice Providers (APPs -  Physician Assistants and Nurse Practitioners) who all work together to provide you with the care you need, when you need it.  Your next appointment:   6 month(s)  Provider:   ChriDonato Heinz     Other Instructions CallAnnamary Carolin recommended that you follow up with GI    Signed, CallDarreld Mclean-C  06/28/2022 12:26 PM    [facility]

## 2022-06-28 ENCOUNTER — Ambulatory Visit: Payer: Medicare Other | Attending: Student | Admitting: Student

## 2022-06-28 ENCOUNTER — Encounter: Payer: Self-pay | Admitting: Student

## 2022-06-28 VITALS — BP 120/78 | HR 62 | Ht 74.0 in | Wt 173.0 lb

## 2022-06-28 DIAGNOSIS — E875 Hyperkalemia: Secondary | ICD-10-CM

## 2022-06-28 DIAGNOSIS — I10 Essential (primary) hypertension: Secondary | ICD-10-CM

## 2022-06-28 DIAGNOSIS — E785 Hyperlipidemia, unspecified: Secondary | ICD-10-CM | POA: Diagnosis not present

## 2022-06-28 DIAGNOSIS — I4729 Other ventricular tachycardia: Secondary | ICD-10-CM | POA: Diagnosis not present

## 2022-06-28 DIAGNOSIS — I2510 Atherosclerotic heart disease of native coronary artery without angina pectoris: Secondary | ICD-10-CM | POA: Diagnosis not present

## 2022-06-28 DIAGNOSIS — R001 Bradycardia, unspecified: Secondary | ICD-10-CM | POA: Diagnosis not present

## 2022-06-28 NOTE — Patient Instructions (Signed)
Medication Instructions:   Your physician recommends that you continue on your current medications as directed. Please refer to the Current Medication list given to you today.  *If you need a refill on your cardiac medications before your next appointment, please call your pharmacy*  Lab Work: Your physician recommends that you return for lab work TODAY:  BMP  If you have labs (blood work) drawn today and your tests are completely normal, you will receive your results only by: [place] (if you have [place]) OR A paper copy in the mail If you have any lab test that is abnormal or we need to change your treatment, we will call you to review the results.  Testing/Procedures: NONE ordered at this time of appointment   Follow-Up: At [facility], you and your health needs are our priority.  As part of our continuing mission to provide you with exceptional heart care, we have created designated Provider Care Teams.  These Care Teams include your primary Cardiologist (physician) and Advanced Practice Providers (APPs -  Physician Assistants and Nurse Practitioners) who all work together to provide you with the care you need, when you need it.  Your next appointment:   6 month(s)  Provider:   Donato Heinz, MD     Other Instructions Andre Martinez has recommended that you follow up with GI

## 2022-06-29 LAB — BASIC METABOLIC PANEL
BUN/Creatinine Ratio: 22 (ref 10–24)
BUN: 18 mg/dL (ref 8–27)
CO2: 24 mmol/L (ref 20–29)
Calcium: 9.4 mg/dL (ref 8.6–10.2)
Chloride: 103 mmol/L (ref 96–106)
Creatinine, Ser: 0.81 mg/dL (ref 0.76–1.27)
Glucose: 85 mg/dL (ref 70–99)
Potassium: 4.9 mmol/L (ref 3.5–5.2)
Sodium: 140 mmol/L (ref 134–144)
eGFR: 96 mL/min/{1.73_m2} (ref >59)

## 2022-07-05 ENCOUNTER — Other Ambulatory Visit: Payer: Self-pay | Admitting: Student

## 2022-07-21 ENCOUNTER — Other Ambulatory Visit: Payer: Self-pay | Admitting: Cardiology

## 2022-08-14 ENCOUNTER — Other Ambulatory Visit: Payer: Self-pay | Admitting: Family Medicine

## 2022-08-14 DIAGNOSIS — I10 Essential (primary) hypertension: Secondary | ICD-10-CM

## 2022-08-31 ENCOUNTER — Other Ambulatory Visit: Payer: Self-pay | Admitting: Student

## 2022-08-31 DIAGNOSIS — E785 Hyperlipidemia, unspecified: Secondary | ICD-10-CM

## 2022-10-19 DIAGNOSIS — M1711 Unilateral primary osteoarthritis, right knee: Secondary | ICD-10-CM | POA: Diagnosis not present

## 2022-10-27 DIAGNOSIS — M1711 Unilateral primary osteoarthritis, right knee: Secondary | ICD-10-CM | POA: Diagnosis not present

## 2022-11-03 DIAGNOSIS — M1711 Unilateral primary osteoarthritis, right knee: Secondary | ICD-10-CM | POA: Diagnosis not present

## 2022-12-13 DIAGNOSIS — H524 Presbyopia: Secondary | ICD-10-CM | POA: Diagnosis not present

## 2022-12-13 DIAGNOSIS — H2513 Age-related nuclear cataract, bilateral: Secondary | ICD-10-CM | POA: Diagnosis not present

## 2022-12-13 DIAGNOSIS — H53143 Visual discomfort, bilateral: Secondary | ICD-10-CM | POA: Diagnosis not present

## 2022-12-20 ENCOUNTER — Other Ambulatory Visit: Payer: Self-pay | Admitting: Family Medicine

## 2022-12-20 DIAGNOSIS — I10 Essential (primary) hypertension: Secondary | ICD-10-CM

## 2023-01-05 NOTE — Progress Notes (Signed)
Cardiology Office Note:    Date:  01/06/2023   ID:  Andre Martinez, DOB 10-Feb-1956, MRN [id number]  PCP:  Ronnald Nian, MD  Cardiologist:  Little Ishikawa, MD  Electrophysiologist:  None   Referring MD: Ronnald Nian, MD   Chief Complaint  Patient presents with   Coronary Artery Disease    History of Present Illness:    Andre Martinez is a 67 y.o. male with a hx of CAD with NSTEMI and DES to RCA on 09/30/2021, hypertension, hyperlipidemia, PVCs, GERD who presents for follow-up.  He was hospitalized 09/2021 with chest pain, found to have NSTEMI.  LHC showed 50% stenosis of mid RCA followed by 90% stenosis of distal RCA.  Underwent successful PCI with DES to distal RCA.  Echocardiogram showed EF 60 to 65%.  Since last clinic visit, he reports he is doing well.  Walks every morning for 40 minutes.  Denies any exertional chest pain or dyspnea.  Denies any lower extremity edema or palpitations.  Reports some lightheadedness but denies any syncope.  Denies any bleeding issues on DAPT.   Past Medical History:  Diagnosis Date   Allergy    Arthritis    Coronary artery disease    GERD (gastroesophageal reflux disease)    Hemorrhoids    Hyperlipidemia    Hypertension    Sinusitis 12/94 3/95    Past Surgical History:  Procedure Laterality Date   CARDIAC CATHETERIZATION     COLONOSCOPY  2006   DR.HAYES   CORONARY STENT INTERVENTION N/A 09/30/2021   Procedure: CORONARY STENT INTERVENTION;  Surgeon: Corky Crafts, MD;  Location: [facility] INVASIVE CV LAB;  Service: Cardiovascular;  Laterality: N/A;   LEFT HEART CATH AND CORONARY ANGIOGRAPHY N/A 09/30/2021   Procedure: LEFT HEART CATH AND CORONARY ANGIOGRAPHY;  Surgeon: Corky Crafts, MD;  Location: [facility] INVASIVE CV LAB;  Service: Cardiovascular;  Laterality: N/A;   rectal fissure     REPAIR   SKIN BIOPSY Left 07/25/2018   see report in chat   SKIN BIOPSY Left 07/08/2021   dysplastic compound nevs with severe  atypia margin close   SKIN BIOPSY Left 11/04/2021   no residual dysplastic nevu   TONSILLECTOMY     TOTAL KNEE ARTHROPLASTY Left 05/21/2018   Procedure: LEFT TOTAL KNEE ARTHROPLASTY;  Surgeon: Ollen Gross, MD;  Location: [facility] ORS;  Service: Orthopedics;  Laterality: Left;    UPPER GI ENDOSCOPY  04/08/2013   normal esophagus,multiple gastric polyps,normal examined duodenum    Current Medications: Current Meds  Medication Sig   amLODipine (NORVASC) 2.5 MG tablet TAKE 1 TABLET BY MOUTH ONCE  DAILY   aspirin 81 MG chewable tablet Chew 1 tablet (81 mg total) by mouth daily.   atorvastatin (LIPITOR) 40 MG tablet TAKE 1 TABLET BY MOUTH DAILY   metoprolol succinate (TOPROL-XL) 50 MG 24 hr tablet TAKE 1 TABLET BY MOUTH DAILY  WITH OR IMMEDIATELY FOLLOWING A  MEAL   Multiple Vitamin (MULTIVITAMIN) tablet Take 1 tablet by mouth in the morning. Centrum Silver   nitroGLYCERIN (NITROSTAT) 0.4 MG SL tablet Place 1 tablet (0.4 mg total) under the tongue every 5 (five) minutes x 3 doses as needed for chest pain.   pantoprazole (PROTONIX) 40 MG tablet TAKE 1 TABLET BY MOUTH DAILY   Polyethylene Glycol 400 (BLINK TEARS) 0.25 % SOLN Place 1-2 drops into both eyes 3 (three) times daily as needed (dry/irritated eyes.).   [DISCONTINUED] clopidogrel (PLAVIX) 75 MG tablet TAKE 1 TABLET  BY MOUTH DAILY AS  DIRECTED     Allergies:   Aciphex [rabeprazole], Codeine, Prevacid [lansoprazole], and Penicillins   Social History   Socioeconomic History   Marital status: Married    Spouse name: Not on file   Number of children: Not on file   Years of education: Not on file   Highest education level: Master's degree (e.g., MA, MS, MEng, MEd, MSW, MBA)  Occupational History   Not on file  Tobacco Use   Smoking status: Former    Types: Cigars   Smokeless tobacco: Never  Vaping Use   Vaping status: Never Used  Substance and Sexual Activity   Alcohol use: Yes    Alcohol/week: 3.0 standard drinks of alcohol     Types: 3 Cans of beer per week    Comment: occasionaly   Drug use: No   Sexual activity: Yes  Other Topics Concern   Not on file  Social History Narrative   Not on file   Social Determinants of Health   Financial Resource Strain: Not on file  Food Insecurity: Not on file  Transportation Needs: Not on file  Physical Activity: Not on file  Stress: Not on file  Social Connections: Not on file     Family History: The patient's family history includes Diabetes in his mother; Heart disease in his father and paternal grandfather; Hypertension in his father and mother.  ROS:   Please see the history of present illness.     All other systems reviewed and are negative.  EKGs/Labs/Other Studies Reviewed:    The following studies were reviewed today:  EKG:   01/06/2023: Sinus bradycardia, rate 53, nonspecific T wave flattening  Recent Labs: 05/02/2022: ALT 19 06/28/2022: BUN 18; Creatinine, Ser 0.81; Potassium 4.9; Sodium 140  Recent Lipid Panel    Component Value Date/Time   CHOL 113 03/09/2022 0819   TRIG 62 03/09/2022 0819   HDL 48 03/09/2022 0819   CHOLHDL 2.4 03/09/2022 0819   CHOLHDL 3.1 10/01/2021 0152   VLDL 21 10/01/2021 0152   LDLCALC 51 03/09/2022 0819    Physical Exam:    VS:  BP 110/82   Pulse (!) 52   Ht 6\' 2"  (1.88 m)   Wt 177 lb 9.6 oz (80.6 kg)   SpO2 98%   BMI 22.80 kg/m     Wt Readings from Last 3 Encounters:  01/06/23 177 lb 9.6 oz (80.6 kg)  06/28/22 173 lb (78.5 kg)  05/02/22 167 lb 6.4 oz (75.9 kg)     GEN:  Well nourished, well developed in no acute distress HEENT: Normal NECK: No JVD; No carotid bruits LYMPHATICS: No lymphadenopathy CARDIAC: RRR, no murmurs, rubs, gallops RESPIRATORY:  Clear to auscultation without rales, wheezing or rhonchi  ABDOMEN: Soft, non-tender, non-distended MUSCULOSKELETAL:  No edema; No deformity  SKIN: Warm and dry NEUROLOGIC:  Alert and oriented x 3 PSYCHIATRIC:  Normal affect   ASSESSMENT:    1.  Coronary artery disease involving native coronary artery of native heart without angina pectoris   2. PVCs (premature ventricular contractions)   3. Primary hypertension   4. Hyperlipidemia, unspecified hyperlipidemia type    PLAN:    CAD: hospitalized 09/2021 with chest pain, found to have NSTEMI.  LHC showed 50% stenosis of mid RCA followed by 90% stenosis of distal RCA.  Underwent successful PCI with DES to distal RCA.  Echocardiogram showed EF 60 to 65%. -Continue aspirin 81 mg daily, can discontinue Plavix since greater than 1 year  post PCI -Continue Toprol-XL 50 mg daily and amlodipine 2.5 mg daily -Continue atorvastatin 40 mg daily  PVCs: Noted to have frequent PVCs during admission 09/2021 as well as NSVT.  Zio patch x 14 days 09/2021 showed 1 episode of NSVT lasting 6 beats, rare PVCs. -Continue Toprol-XL 50 mg daily  Hypertension: Continue Toprol-XL 50 mg daily and amlodipine 2.5 mg daily  Hyperlipidemia: Continue atorvastatin 40 mg daily, LDL 51 on 02/2022  RTC in 6 months  Medication Adjustments/Labs and Tests Ordered: Current medicines are reviewed at length with the patient today.  Concerns regarding medicines are outlined above.  Orders Placed This Encounter  Procedures   EKG 12-Lead   No orders of the defined types were placed in this encounter.   Patient Instructions  Medication Instructions:  Your physician recommends that you continue on your current medications as directed. Please refer to the Current Medication list given to you today.  *If you need a refill on your cardiac medications before your next appointment, please call your pharmacy*    Follow-Up: At [facility], you and your health needs are our priority.  As part of our continuing mission to provide you with exceptional heart care, we have created designated Provider Care Teams.  These Care Teams include your primary Cardiologist (physician) and Advanced Practice Providers (APPs -   Physician Assistants and Nurse Practitioners) who all work together to provide you with the care you need, when you need it.   Your next appointment:   6 month(s)  Provider:   Little Ishikawa, MD     Signed, Little Ishikawa, MD  01/06/2023 9:40 AM    [facility]

## 2023-01-06 ENCOUNTER — Ambulatory Visit: Payer: Medicare Other | Attending: Cardiology | Admitting: Cardiology

## 2023-01-06 VITALS — BP 110/82 | HR 52 | Ht 74.0 in | Wt 177.6 lb

## 2023-01-06 DIAGNOSIS — I10 Essential (primary) hypertension: Secondary | ICD-10-CM

## 2023-01-06 DIAGNOSIS — I493 Ventricular premature depolarization: Secondary | ICD-10-CM | POA: Diagnosis not present

## 2023-01-06 DIAGNOSIS — I2510 Atherosclerotic heart disease of native coronary artery without angina pectoris: Secondary | ICD-10-CM

## 2023-01-06 DIAGNOSIS — E785 Hyperlipidemia, unspecified: Secondary | ICD-10-CM | POA: Diagnosis not present

## 2023-01-06 NOTE — Patient Instructions (Signed)
Medication Instructions:  Your physician recommends that you continue on your current medications as directed. Please refer to the Current Medication list given to you today.  *If you need a refill on your cardiac medications before your next appointment, please call your pharmacy*    Follow-Up: At [facility], you and your health needs are our priority.  As part of our continuing mission to provide you with exceptional heart care, we have created designated Provider Care Teams.  These Care Teams include your primary Cardiologist (physician) and Advanced Practice Providers (APPs -  Physician Assistants and Nurse Practitioners) who all work together to provide you with the care you need, when you need it.   Your next appointment:   6 month(s)  Provider:   Little Ishikawa, MD

## 2023-01-07 ENCOUNTER — Other Ambulatory Visit: Payer: Self-pay | Admitting: Student

## 2023-02-03 ENCOUNTER — Other Ambulatory Visit: Payer: Self-pay | Admitting: Student

## 2023-02-28 ENCOUNTER — Other Ambulatory Visit: Payer: Self-pay | Admitting: Family Medicine

## 2023-02-28 DIAGNOSIS — I10 Essential (primary) hypertension: Secondary | ICD-10-CM

## 2023-03-15 ENCOUNTER — Other Ambulatory Visit: Payer: Self-pay | Admitting: Student

## 2023-04-25 ENCOUNTER — Ambulatory Visit: Payer: Medicare Other

## 2023-04-25 DIAGNOSIS — Z0000 Encounter for general adult medical examination without abnormal findings: Secondary | ICD-10-CM | POA: Diagnosis not present

## 2023-04-25 NOTE — Progress Notes (Signed)
Subjective:   Andre Martinez is a 67 y.o. male who presents for Medicare Annual/Subsequent preventive examination.  Visit Complete: Virtual I connected with  [name] on 04/25/23 by a audio enabled telemedicine application and verified that I am speaking with the correct person using two identifiers.  Patient Location: Home  Provider Location: Office/Clinic  I discussed the limitations of evaluation and management by telemedicine. The patient expressed understanding and agreed to proceed.  Vital Signs: Because this visit was a virtual/telehealth visit, some criteria may be missing or patient reported. Any vitals not documented were not able to be obtained and vitals that have been documented are patient reported.  Patient Medicare AWV questionnaire was completed by the patient on 04/21/2023; I have confirmed that all information answered by patient is correct and no changes since this date.  Cardiac Risk Factors include: advanced age (>41men, >49 women);dyslipidemia;hypertension;male gender     Objective:    Today's Vitals   There is no height or weight on file to calculate BMI.     04/25/2023    2:32 PM 09/30/2021    1:05 PM 04/15/2021    9:50 AM 04/13/2020    8:27 AM 04/10/2019    9:03 AM 05/21/2018   12:55 PM 05/17/2018    9:33 AM  Advanced Directives  Does Patient Have a Medical Advance Directive? Yes No No Yes Yes Yes Yes  Type of Advance Directive Living will   Living will;Healthcare Power of [employer] Power of [place];Living will Living will Living will  Does patient want to make changes to medical advance directive?    No - Patient declined No - Patient declined No - Patient declined No - Patient declined  Copy of Healthcare Power of Attorney in Chart?    No - copy requested No - copy requested    Would patient like information on creating a medical advance directive?  No - Patient declined Yes (ED - Information included in AVS)        Current  Medications (verified) Outpatient Encounter Medications as of 04/25/2023  Medication Sig   amLODipine (NORVASC) 2.5 MG tablet TAKE 1 TABLET BY MOUTH ONCE  DAILY   aspirin 81 MG chewable tablet Chew 1 tablet (81 mg total) by mouth daily.   atorvastatin (LIPITOR) 40 MG tablet TAKE 1 TABLET BY MOUTH DAILY   metoprolol succinate (TOPROL-XL) 50 MG 24 hr tablet TAKE 1 TABLET BY MOUTH DAILY  WITH OR IMMEDIATELY FOLLOWING A  MEAL   Multiple Vitamin (MULTIVITAMIN) tablet Take 1 tablet by mouth in the morning. Centrum Silver   nitroGLYCERIN (NITROSTAT) 0.4 MG SL tablet Place 1 tablet (0.4 mg total) under the tongue every 5 (five) minutes x 3 doses as needed for chest pain.   pantoprazole (PROTONIX) 40 MG tablet TAKE 1 TABLET BY MOUTH ONCE  DAILY   Polyethylene Glycol 400 (BLINK TEARS) 0.25 % SOLN Place 1-2 drops into both eyes 3 (three) times daily as needed (dry/irritated eyes.).   diclofenac Sodium (VOLTAREN) 1 % GEL Apply 1 Application topically 4 (four) times daily as needed (arthritis pain (knee)). (Patient not taking: Reported on 01/06/2023)   No facility-administered encounter medications on file as of 04/25/2023.    Allergies (verified) Aciphex [rabeprazole], Codeine, Prevacid [lansoprazole], and Penicillins   History: Past Medical History:  Diagnosis Date   Allergy    Arthritis    Coronary artery disease    GERD (gastroesophageal reflux disease)    Hemorrhoids    Hyperlipidemia  Hypertension    Sinusitis 12/94 3/95   Past Surgical History:  Procedure Laterality Date   CARDIAC CATHETERIZATION     COLONOSCOPY  2006   DR.HAYES   CORONARY STENT INTERVENTION N/A 09/30/2021   Procedure: CORONARY STENT INTERVENTION;  Surgeon: Corky Crafts, MD;  Location: [facility] INVASIVE CV LAB;  Service: Cardiovascular;  Laterality: N/A;   LEFT HEART CATH AND CORONARY ANGIOGRAPHY N/A 09/30/2021   Procedure: LEFT HEART CATH AND CORONARY ANGIOGRAPHY;  Surgeon: Corky Crafts, MD;  Location: [facility]  INVASIVE CV LAB;  Service: Cardiovascular;  Laterality: N/A;   rectal fissure     REPAIR   SKIN BIOPSY Left 07/25/2018   see report in chat   SKIN BIOPSY Left 07/08/2021   dysplastic compound nevs with severe atypia margin close   SKIN BIOPSY Left 11/04/2021   no residual dysplastic nevu   TONSILLECTOMY     TOTAL KNEE ARTHROPLASTY Left 05/21/2018   Procedure: LEFT TOTAL KNEE ARTHROPLASTY;  Surgeon: Ollen Gross, MD;  Location: [facility] ORS;  Service: Orthopedics;  Laterality: Left;    UPPER GI ENDOSCOPY  04/08/2013   normal esophagus,multiple gastric polyps,normal examined duodenum   Family History  Problem Relation Age of Onset   Hypertension Mother    Diabetes Mother    Heart disease Father    Hypertension Father    Heart disease Paternal Grandfather    Social History   Socioeconomic History   Marital status: Married    Spouse name: Not on file   Number of children: Not on file   Years of education: Not on file   Highest education level: Master's degree (e.g., MA, MS, MEng, MEd, MSW, MBA)  Occupational History   Not on file  Tobacco Use   Smoking status: Former    Types: Cigars   Smokeless tobacco: Never  Vaping Use   Vaping status: Never Used  Substance and Sexual Activity   Alcohol use: Yes    Alcohol/week: 3.0 standard drinks of alcohol    Types: 3 Cans of beer per week    Comment: occasionaly   Drug use: No   Sexual activity: Yes  Other Topics Concern   Not on file  Social History Narrative   Not on file   Social Determinants of Health   Financial Resource Strain: Low Risk  (04/21/2023)   Overall Financial Resource Strain (CARDIA)    Difficulty of Paying Living Expenses: Not hard at all  Food Insecurity: No Food Insecurity (04/21/2023)   Hunger Vital Sign    Worried About Running Out of Food in the Last Year: Never true    Ran Out of Food in the Last Year: Never true  Transportation Needs: No Transportation Needs (04/21/2023)   PRAPARE -  Administrator, Civil Service (Medical): No    Lack of Transportation (Non-Medical): No  Physical Activity: Sufficiently Active (04/21/2023)   Exercise Vital Sign    Days of Exercise per Week: 7 days    Minutes of Exercise per Session: 40 min  Stress: No Stress Concern Present (04/21/2023)   [employer] of Occupational Health - Occupational Stress Questionnaire    Feeling of Stress : Only a little  Social Connections: Unknown (04/21/2023)   Social Connection and Isolation Panel [NHANES]    Frequency of Communication with Friends and Family: Once a week    Frequency of Social Gatherings with Friends and Family: Once a week    Attends Religious Services: Not on file    Active  Member of Clubs or Organizations: Yes    Attends Engineer, structural: 1 to 4 times per year    Marital Status: Married    Tobacco Counseling Counseling given: Not Answered   Clinical Intake:  Pre-visit preparation completed: Yes  Pain : No/denies pain     Nutritional Risks: Nausea/ vomitting/ diarrhea (nausea one night and stomach burned a couple days) Diabetes: No  How often do you need to have someone help you when you read instructions, pamphlets, or other written materials from your doctor or pharmacy?: 1 - Never  Interpreter Needed?: No  Information entered by :: NAllen LPN   Activities of Daily Living    04/21/2023   10:57 AM 05/02/2022    9:20 AM  In your present state of health, do you have any difficulty performing the following activities:  Hearing? 0 0  Vision? 0 0  Difficulty concentrating or making decisions? 0 0  Walking or climbing stairs? 0 0  Dressing or bathing? 0 0  Doing errands, shopping? 0 0  Preparing Food and eating ? N   Using the Toilet? N   In the past six months, have you accidently leaked urine? N   Do you have problems with loss of bowel control? N   Managing your Medications? N   Managing your Finances? N   Housekeeping or managing  your Housekeeping? N     Patient Care Team: Ronnald Nian, MD as PCP - General (Family Medicine) Little Ishikawa, MD as PCP - Cardiology (Cardiology)  Indicate any recent Medical Services you may have received from other than [facility] providers in the past year (date may be approximate).     Assessment:   This is a routine wellness examination for Pauline.  Hearing/Vision screen Hearing Screening - Comments:: Denies hearing issues Vision Screening - Comments:: Regular eye exams, [facility]   Goals Addressed             This Visit's Progress    Patient Stated       04/25/2023, watch sodium and keep it under control, watch sugar intake       Depression Screen    04/25/2023    2:33 PM 05/02/2022    9:20 AM 02/09/2022   11:10 AM 12/07/2021   10:07 AM 04/15/2021    9:52 AM 04/13/2020    8:28 AM 04/10/2019    8:45 AM  PHQ 2/9 Scores  PHQ - 2 Score 0 0 0 0 0 0 0  PHQ- 9 Score 0          Fall Risk    04/21/2023   10:57 AM 05/02/2022    9:20 AM 12/07/2021    9:47 AM 04/15/2021    9:51 AM 04/13/2020    8:27 AM  Fall Risk   Falls in the past year? 0 0 0 0 0  Number falls in past yr: 0 0  0   Injury with Fall? 0 0  0   Risk for fall due to : Medication side effect No Fall Risks No Fall Risks No Fall Risks   Follow up Falls prevention discussed;Falls evaluation completed Falls evaluation completed Falls evaluation completed Falls evaluation completed     MEDICARE RISK AT HOME: Medicare Risk at Home Any stairs in or around the home?: No If so, are there any without handrails?: No Home free of loose throw rugs in walkways, pet beds, electrical cords, etc?: Yes Adequate lighting in your home to reduce risk of  falls?: Yes Life alert?: No Use of a cane, walker or w/c?: No Grab bars in the bathroom?: Yes Shower chair or bench in shower?: No Elevated toilet seat or a handicapped toilet?: Yes  TIMED UP AND GO:  Was the test performed?  No    Cognitive  Function:        04/25/2023    2:34 PM  6CIT Screen  What Year? 0 points  What month? 0 points  What time? 0 points  Count back from 20 0 points  Months in reverse 0 points  Repeat phrase 0 points  Total Score 0 points    Immunizations Immunization History  Administered Date(s) Administered   Fluad Quad(high Dose 65+) 02/14/2022, 03/09/2023   Influenza Split 02/19/2013   Influenza, Quadrivalent, Recombinant, Inj, Pf 02/19/2019   Influenza,inj,Quad PF,6+ Mos 01/16/2014   Influenza-Unspecified 03/15/2016, 03/28/2017, 02/08/2018, 02/19/2019, 02/10/2021   PFIZER Comirnaty(Gray Top)Covid-19 Tri-Sucrose Vaccine 02/12/2020, 11/17/2020   PFIZER(Purple Top)SARS-COV-2 Vaccination 06/07/2019, 06/28/2019   Pfizer(Comirnaty)Fall Seasonal Vaccine 12 years and older 04/06/2022   Pneumococcal Conjugate-13 04/10/2019   Pneumococcal Polysaccharide-23 04/13/2020   Respiratory Syncytial Virus Vaccine,Recomb Aduvanted(Arexvy) 04/19/2023   Tdap 04/02/2001, 10/18/2011, 05/27/2022   Unspecified SARS-COV-2 Vaccination 03/09/2023   Zoster Recombinant(Shingrix) 03/06/2018, 05/07/2018   Zoster, Live 04/21/2014    TDAP status: Up to date  Flu Vaccine status: Up to date  Pneumococcal vaccine status: Up to date  Covid-19 vaccine status: Completed vaccines  Qualifies for Shingles Vaccine? Yes   Zostavax completed Yes   Shingrix Completed?: Yes  Screening Tests Health Maintenance  Topic Date Due   Medicare Annual Wellness (AWV)  04/24/2024   Colonoscopy  06/01/2031   DTaP/Tdap/Td (4 - Td or Tdap) 05/27/2032   Pneumonia Vaccine 66+ Years old  Completed   INFLUENZA VACCINE  Completed   COVID-19 Vaccine  Completed   Hepatitis C Screening  Completed   Zoster Vaccines- Shingrix  Completed   HPV VACCINES  Aged Out    Health Maintenance  There are no preventive care reminders to display for this patient.   Colorectal cancer screening: Type of screening: Colonoscopy. Completed 05/31/2021.  Repeat every 10 years  Lung Cancer Screening: (Low Dose CT Chest recommended if Age 68-80 years, 20 pack-year currently smoking OR have quit w/in 15years.) does not qualify.   Lung Cancer Screening Referral: no  Additional Screening:  Hepatitis C Screening: does qualify; Completed 11/20/2015  Vision Screening: Recommended annual ophthalmology exams for early detection of glaucoma and other disorders of the eye. Is the patient up to date with their annual eye exam?  Yes  Who is the provider or what is the name of the office in which the patient attends annual eye exams? [facility] If pt is not established with a provider, would they like to be referred to a provider to establish care? No .   Dental Screening: Recommended annual dental exams for proper oral hygiene  Diabetic Foot Exam: n/a  Community Resource Referral / Chronic Care Management: CRR required this visit?  No   CCM required this visit?  No     Plan:     I have personally reviewed and noted the following in the patient's chart:   Medical and social history Use of alcohol, tobacco or illicit drugs  Current medications and supplements including opioid prescriptions. Patient is not currently taking opioid prescriptions. Functional ability and status Nutritional status Physical activity Advanced directives List of other physicians Hospitalizations, surgeries, and ER visits in previous 12 months Vitals Screenings  to include cognitive, depression, and falls Referrals and appointments  In addition, I have reviewed and discussed with patient certain preventive protocols, quality metrics, and best practice recommendations. A written personalized care plan for preventive services as well as general preventive health recommendations were provided to patient.     Barb Merino, LPN   16/02/[id number]   After Visit Summary: (MyChart) Due to this being a telephonic visit, the after visit summary with patients personalized  plan was offered to patient via MyChart   Nurse Notes: none

## 2023-04-25 NOTE — Patient Instructions (Signed)
Mr. Andre Martinez , Thank you for taking time to come for your Medicare Wellness Visit. I appreciate your ongoing commitment to your health goals. Please review the following plan we discussed and let me know if I can assist you in the future.   Referrals/Orders/Follow-Ups/Clinician Recommendations: none  This is a list of the screening recommended for you and due dates:  Health Maintenance  Topic Date Due   Medicare Annual Wellness Visit  04/24/2024   Colon Cancer Screening  06/01/2031   DTaP/Tdap/Td vaccine (4 - Td or Tdap) 05/27/2032   Pneumonia Vaccine  Completed   Flu Shot  Completed   COVID-19 Vaccine  Completed   Hepatitis C Screening  Completed   Zoster (Shingles) Vaccine  Completed   HPV Vaccine  Aged Out    Advanced directives: (Copy Requested) Please bring a copy of your health care power of attorney and living will to the office to be added to your chart at your convenience.  Next Medicare Annual Wellness Visit scheduled for next year: Yes  insert Preventive Care attachment Insert FALL PREVENTION attachment if needed

## 2023-05-11 ENCOUNTER — Other Ambulatory Visit: Payer: Self-pay | Admitting: Family Medicine

## 2023-05-11 DIAGNOSIS — I10 Essential (primary) hypertension: Secondary | ICD-10-CM

## 2023-05-23 ENCOUNTER — Ambulatory Visit: Payer: Medicare Other | Admitting: Family Medicine

## 2023-05-23 ENCOUNTER — Encounter: Payer: Self-pay | Admitting: Family Medicine

## 2023-05-23 VITALS — BP 110/82 | HR 60 | Ht 73.5 in | Wt 179.6 lb

## 2023-05-23 DIAGNOSIS — E782 Mixed hyperlipidemia: Secondary | ICD-10-CM | POA: Diagnosis not present

## 2023-05-23 DIAGNOSIS — I10 Essential (primary) hypertension: Secondary | ICD-10-CM

## 2023-05-23 DIAGNOSIS — Z0000 Encounter for general adult medical examination without abnormal findings: Secondary | ICD-10-CM | POA: Diagnosis not present

## 2023-05-23 DIAGNOSIS — I214 Non-ST elevation (NSTEMI) myocardial infarction: Secondary | ICD-10-CM | POA: Diagnosis not present

## 2023-05-23 DIAGNOSIS — K219 Gastro-esophageal reflux disease without esophagitis: Secondary | ICD-10-CM

## 2023-05-23 DIAGNOSIS — J301 Allergic rhinitis due to pollen: Secondary | ICD-10-CM

## 2023-05-23 DIAGNOSIS — Z96652 Presence of left artificial knee joint: Secondary | ICD-10-CM | POA: Diagnosis not present

## 2023-05-23 DIAGNOSIS — R7303 Prediabetes: Secondary | ICD-10-CM | POA: Diagnosis not present

## 2023-05-23 DIAGNOSIS — M1990 Unspecified osteoarthritis, unspecified site: Secondary | ICD-10-CM

## 2023-05-23 DIAGNOSIS — N5201 Erectile dysfunction due to arterial insufficiency: Secondary | ICD-10-CM

## 2023-05-23 DIAGNOSIS — E785 Hyperlipidemia, unspecified: Secondary | ICD-10-CM

## 2023-05-23 LAB — COMPREHENSIVE METABOLIC PANEL
ALT: 16 [IU]/L (ref 0–44)
AST: 24 [IU]/L (ref 0–40)
Albumin: 4.2 g/dL (ref 3.9–4.9)
Alkaline Phosphatase: 105 [IU]/L (ref 44–121)
BUN/Creatinine Ratio: 20 (ref 10–24)
BUN: 19 mg/dL (ref 8–27)
Bilirubin Total: 0.6 mg/dL (ref 0.0–1.2)
CO2: 24 mmol/L (ref 20–29)
Calcium: 9.3 mg/dL (ref 8.6–10.2)
Chloride: 103 mmol/L (ref 96–106)
Creatinine, Ser: 0.96 mg/dL (ref 0.76–1.27)
Globulin, Total: 1.9 g/dL (ref 1.5–4.5)
Glucose: 82 mg/dL (ref 70–99)
Potassium: 4.9 mmol/L (ref 3.5–5.2)
Sodium: 142 mmol/L (ref 134–144)
Total Protein: 6.1 g/dL (ref 6.0–8.5)
eGFR: 86 mL/min/{1.73_m2} (ref >59)

## 2023-05-23 LAB — CBC WITH DIFFERENTIAL/PLATELET
Basophils Absolute: 0 10*3/uL (ref 0.0–0.2)
Basos: 1 %
EOS (ABSOLUTE): 0.1 10*3/uL (ref 0.0–0.4)
Eos: 1 %
Hematocrit: 47.5 % (ref 37.5–51.0)
Hemoglobin: 15.8 g/dL (ref 13.0–17.7)
Immature Grans (Abs): 0 10*3/uL (ref 0.0–0.1)
Immature Granulocytes: 0 %
Lymphocytes Absolute: 2 10*3/uL (ref 0.7–3.1)
Lymphs: 25 %
MCH: 30.6 pg (ref 26.6–33.0)
MCHC: 33.3 g/dL (ref 31.5–35.7)
MCV: 92 fL (ref 79–97)
Monocytes Absolute: 0.7 10*3/uL (ref 0.1–0.9)
Monocytes: 8 %
Neutrophils Absolute: 5.3 10*3/uL (ref 1.4–7.0)
Neutrophils: 65 %
Platelets: 215 10*3/uL (ref 150–450)
RBC: 5.17 x10E6/uL (ref 4.14–5.80)
RDW: 12.5 % (ref 11.6–15.4)
WBC: 8.2 10*3/uL (ref 3.4–10.8)

## 2023-05-23 LAB — POCT GLYCOSYLATED HEMOGLOBIN (HGB A1C): Hemoglobin A1C: 5.6 % (ref 4.0–5.6)

## 2023-05-23 LAB — LIPID PANEL
Chol/HDL Ratio: 2.4 {ratio} (ref 0.0–5.0)
Cholesterol, Total: 125 mg/dL (ref 100–199)
HDL: 52 mg/dL (ref >39)
LDL Chol Calc (NIH): 59 mg/dL (ref 0–99)
Triglycerides: 66 mg/dL (ref 0–149)
VLDL Cholesterol Cal: 14 mg/dL (ref 5–40)

## 2023-05-23 MED ORDER — METOPROLOL SUCCINATE ER 50 MG PO TB24: 50.0000 mg | ORAL_TABLET | Freq: Every day | ORAL | 3 refills | Status: DC

## 2023-05-23 MED ORDER — AMLODIPINE BESYLATE 2.5 MG PO TABS: 2.5000 mg | ORAL_TABLET | Freq: Every day | ORAL | 3 refills | Status: DC

## 2023-05-23 MED ORDER — ATORVASTATIN CALCIUM 40 MG PO TABS: 40.0000 mg | ORAL_TABLET | Freq: Every day | ORAL | 3 refills | Status: AC

## 2023-05-23 MED ORDER — PANTOPRAZOLE SODIUM 40 MG PO TBEC: 40.0000 mg | DELAYED_RELEASE_TABLET | Freq: Every day | ORAL | 2 refills | Status: DC

## 2023-05-23 NOTE — Progress Notes (Signed)
 Complete physical exam  Patient: Andre Martinez   DOB: [birth year]   68 y.o. Male  MRN: [id number]  Subjective:    Chief Complaint  Patient presents with   Annual Exam    Fasting    Andre Martinez is a 68 y.o. male who presents today for a complete physical exam. He reports consuming a general and low sodium diet. Home exercise routine includes walking 4 hrs per week and stationary bike. He generally feels well. He reports sleeping well. He follows up regularly with cardiology from this previous MI.  Presently he is having no chest pain, shortness of breath PND.  He has had some difficulty with ED and has tried various medications without much success.  He also has had intermittent difficulty with dysphagia but it can be either liquid or solid and he has seen GI in the past.  Presently he is on a PPI.  He continues on metoprolol  and amlodipine .  He is also taking atorvastatin  without difficulty.  He does see orthopedics for his right knee pain has had a previous left knee replacement.  He apparently occasionally gets shots.  His allergies seem to be under good control.  He also has a previous slightly elevated blood sugar.  He is retired and enjoying his retirement. Most recent fall risk assessment:    05/23/2023    9:19 AM  Fall Risk   Falls in the past year? 0  Number falls in past yr: 0  Injury with Fall? 0     Most recent depression screenings:    05/23/2023    9:19 AM 04/25/2023    2:33 PM  PHQ 2/9 Scores  PHQ - 2 Score 0 0  PHQ- 9 Score  0    Vision:Within last year and Dental: No current dental problems and Last dental visit: August 2024    Patient Care Team: Joyce Norleen BROCKS, MD as PCP - General (Family Medicine) Kate Lonni CROME, MD as PCP - Cardiology (Cardiology)   Outpatient Medications Prior to Visit  Medication Sig   aspirin  81 MG chewable tablet Chew 1 tablet (81 mg total) by mouth daily.   Multiple Vitamin (MULTIVITAMIN) tablet Take 1 tablet by  mouth in the morning. Centrum Silver   diclofenac Sodium (VOLTAREN) 1 % GEL Apply 1 Application topically 4 (four) times daily as needed (arthritis pain (knee)). (Patient not taking: Reported on 01/06/2023)   nitroGLYCERIN  (NITROSTAT ) 0.4 MG SL tablet Place 1 tablet (0.4 mg total) under the tongue every 5 (five) minutes x 3 doses as needed for chest pain. (Patient not taking: Reported on 05/23/2023)   Polyethylene Glycol 400 (BLINK TEARS) 0.25 % SOLN Place 1-2 drops into both eyes 3 (three) times daily as needed (dry/irritated eyes.). (Patient not taking: Reported on 05/23/2023)   [DISCONTINUED] amLODipine  (NORVASC ) 2.5 MG tablet TAKE 1 TABLET BY MOUTH ONCE  DAILY   [DISCONTINUED] atorvastatin  (LIPITOR ) 40 MG tablet TAKE 1 TABLET BY MOUTH DAILY   [DISCONTINUED] metoprolol  succinate (TOPROL -XL) 50 MG 24 hr tablet TAKE 1 TABLET BY MOUTH DAILY  WITH OR IMMEDIATELY FOLLOWING A  MEAL   [DISCONTINUED] pantoprazole  (PROTONIX ) 40 MG tablet TAKE 1 TABLET BY MOUTH ONCE  DAILY   No facility-administered medications prior to visit.    Review of Systems  All other systems reviewed and are negative.         Objective:     BP 110/82   Pulse 60   Ht 6' 1.5 (1.867 m)   Wt  179 lb 9.6 oz (81.5 kg)   SpO2 98%   BMI 23.37 kg/m    Physical Exam  Alert and in no distress. Tympanic membranes and canals are normal. Pharyngeal area is normal. Neck is supple without adenopathy or thyromegaly. Cardiac exam shows a regular sinus rhythm without murmurs or gallops. Lungs are clear to auscultation. Hemoglobin A1c is 5.6     Assessment & Plan:    Routine general medical examination at a health care facility - Plan: CBC with Differential/Platelet, Comprehensive metabolic panel, Lipid panel  Seasonal allergic rhinitis due to pollen  Arthritis  Gastroesophageal reflux disease without esophagitis  Non-ST elevation (NSTEMI) myocardial infarction (HCC) - Plan: pantoprazole  (PROTONIX ) 40 MG tablet  Status post  total left knee replacement  Mixed hyperlipidemia - Plan: Lipid panel  Erectile dysfunction due to arterial insufficiency  Essential hypertension - Plan: CBC with Differential/Platelet, Comprehensive metabolic panel, metoprolol  succinate (TOPROL -XL) 50 MG 24 hr tablet, amLODipine  (NORVASC ) 2.5 MG tablet  Prediabetes - Plan: POCT glycosylated hemoglobin (Hb A1C)  Immunization History  Administered Date(s) Administered   Fluad Quad(high Dose 65+) 02/14/2022, 03/09/2023   Influenza Split 02/19/2013   Influenza, Quadrivalent, Recombinant, Inj, Pf 02/19/2019   Influenza,inj,Quad PF,6+ Mos 01/16/2014   Influenza-Unspecified 03/15/2016, 03/28/2017, 02/08/2018, 02/19/2019, 02/10/2021   PFIZER Comirnaty(Gray Top)Covid-19 Tri-Sucrose Vaccine 02/12/2020, 11/17/2020   PFIZER(Purple Top)SARS-COV-2 Vaccination 06/07/2019, 06/28/2019   Pfizer(Comirnaty)Fall Seasonal Vaccine 12 years and older 04/06/2022   Pneumococcal Conjugate-13 04/10/2019   Pneumococcal Polysaccharide-23 04/13/2020   Respiratory Syncytial Virus Vaccine,Recomb Aduvanted(Arexvy) 04/19/2023   Tdap 04/02/2001, 10/18/2011, 05/27/2022   Unspecified SARS-COV-2 Vaccination 03/09/2023   Zoster Recombinant(Shingrix ) 03/06/2018, 05/07/2018   Zoster, Live 04/21/2014    Health Maintenance  Topic Date Due   Medicare Annual Wellness (AWV)  04/24/2024   Colonoscopy  06/01/2031   DTaP/Tdap/Td (4 - Td or Tdap) 05/27/2032   Pneumonia Vaccine 72+ Years old  Completed   INFLUENZA VACCINE  Completed   COVID-19 Vaccine  Completed   Hepatitis C Screening  Completed   Zoster Vaccines- Shingrix   Completed   HPV VACCINES  Aged Out    Discussed health benefits of physical activity, and encouraged him to engage in regular exercise appropriate for his age and condition which he is doing.SABRA  He will continue on his present medication regimen.  Discussed the possible referral to urology to help with his underlying ED and at the present time he will  hold off on that.  Problem List Items Addressed This Visit     Allergic rhinitis   Arthritis   Erectile dysfunction   Essential hypertension   Relevant Medications   atorvastatin  (LIPITOR ) 40 MG tablet   metoprolol  succinate (TOPROL -XL) 50 MG 24 hr tablet   amLODipine  (NORVASC ) 2.5 MG tablet   Other Relevant Orders   CBC with Differential/Platelet   Comprehensive metabolic panel   GERD (gastroesophageal reflux disease)   Relevant Medications   pantoprazole  (PROTONIX ) 40 MG tablet   Hyperlipidemia   Relevant Medications   atorvastatin  (LIPITOR ) 40 MG tablet   metoprolol  succinate (TOPROL -XL) 50 MG 24 hr tablet   amLODipine  (NORVASC ) 2.5 MG tablet   Other Relevant Orders   Lipid panel   Non-ST elevation (NSTEMI) myocardial infarction (HCC)   Relevant Medications   atorvastatin  (LIPITOR ) 40 MG tablet   metoprolol  succinate (TOPROL -XL) 50 MG 24 hr tablet   pantoprazole  (PROTONIX ) 40 MG tablet   amLODipine  (NORVASC ) 2.5 MG tablet   Status post total left knee replacement   Other Visit Diagnoses  Routine general medical examination at a health care facility    -  Primary   Relevant Orders   CBC with Differential/Platelet   Comprehensive metabolic panel   Lipid panel     Prediabetes       Relevant Orders   POCT glycosylated hemoglobin (Hb A1C) (Completed)      Follow-up 1 year     Norleen Jobs, MD

## 2023-06-28 DIAGNOSIS — M1711 Unilateral primary osteoarthritis, right knee: Secondary | ICD-10-CM | POA: Diagnosis not present

## 2023-07-02 NOTE — Progress Notes (Unsigned)
Cardiology Office Note:    Date:  07/06/2023   ID:  Andre Martinez, DOB 13-Feb-1956, MRN [id number]  PCP:  Ronnald Nian, MD  Cardiologist:  Little Ishikawa, MD  Electrophysiologist:  None   Referring MD: Ronnald Nian, MD   Chief Complaint  Patient presents with   Coronary Artery Disease    History of Present Illness:    Andre Martinez is a 68 y.o. male with a hx of CAD with NSTEMI and DES to RCA on 09/30/2021, hypertension, hyperlipidemia, PVCs, GERD who presents for follow-up.  He was hospitalized 09/2021 with chest pain, found to have NSTEMI.  LHC showed 50% stenosis of mid RCA followed by 90% stenosis of distal RCA.  Underwent successful PCI with DES to distal RCA.  Echocardiogram showed EF 60 to 65%.  Since last clinic visit, he reports he has been having chest pain.  States that occurs about once per week.  States it feels like heartburn but feels similar to what he felt with his MI just not as severe.  Has not noted relationship with exertion.  He continues to walk or do elliptical daily for 45 minutes.  He denies any dyspnea, lower extremity edema, or palpitations.  Reports rare lightheadedness but no syncope.  Past Medical History:  Diagnosis Date   Allergy    Arthritis    Coronary artery disease    GERD (gastroesophageal reflux disease)    Hemorrhoids    Hyperlipidemia    Hypertension    Sinusitis 12/94 3/95    Past Surgical History:  Procedure Laterality Date   CARDIAC CATHETERIZATION     COLONOSCOPY  2006   DR.HAYES   CORONARY STENT INTERVENTION N/A 09/30/2021   Procedure: CORONARY STENT INTERVENTION;  Surgeon: Corky Crafts, MD;  Location: [facility] INVASIVE CV LAB;  Service: Cardiovascular;  Laterality: N/A;   LEFT HEART CATH AND CORONARY ANGIOGRAPHY N/A 09/30/2021   Procedure: LEFT HEART CATH AND CORONARY ANGIOGRAPHY;  Surgeon: Corky Crafts, MD;  Location: [facility] INVASIVE CV LAB;  Service: Cardiovascular;  Laterality: N/A;   rectal fissure      REPAIR   SKIN BIOPSY Left 07/25/2018   see report in chat   SKIN BIOPSY Left 07/08/2021   dysplastic compound nevs with severe atypia margin close   SKIN BIOPSY Left 11/04/2021   no residual dysplastic nevu   TONSILLECTOMY     TOTAL KNEE ARTHROPLASTY Left 05/21/2018   Procedure: LEFT TOTAL KNEE ARTHROPLASTY;  Surgeon: Ollen Gross, MD;  Location: [facility] ORS;  Service: Orthopedics;  Laterality: Left;    UPPER GI ENDOSCOPY  04/08/2013   normal esophagus,multiple gastric polyps,normal examined duodenum    Current Medications: Current Meds  Medication Sig   amLODipine (NORVASC) 2.5 MG tablet Take 1 tablet (2.5 mg total) by mouth daily.   aspirin 81 MG chewable tablet Chew 1 tablet (81 mg total) by mouth daily.   atorvastatin (LIPITOR) 40 MG tablet Take 1 tablet (40 mg total) by mouth daily.   diclofenac Sodium (VOLTAREN) 1 % GEL Apply 1 Application topically 4 (four) times daily as needed (arthritis pain (knee)).   metoprolol succinate (TOPROL-XL) 50 MG 24 hr tablet Take 1 tablet (50 mg total) by mouth daily. Take with or immediately following a meal.   Multiple Vitamin (MULTIVITAMIN) tablet Take 1 tablet by mouth in the morning. Centrum Silver   nitroGLYCERIN (NITROSTAT) 0.4 MG SL tablet Place 1 tablet (0.4 mg total) under the tongue every 5 (five) minutes x 3  doses as needed for chest pain.   pantoprazole (PROTONIX) 40 MG tablet Take 1 tablet (40 mg total) by mouth daily.   Polyethylene Glycol 400 (BLINK TEARS) 0.25 % SOLN Place 1-2 drops into both eyes 3 (three) times daily as needed (dry/irritated eyes.).     Allergies:   Aciphex [rabeprazole], Codeine, Prevacid [lansoprazole], and Penicillins   Social History   Socioeconomic History   Marital status: Married    Spouse name: Not on file   Number of children: Not on file   Years of education: Not on file   Highest education level: Master's degree (e.g., MA, MS, MEng, MEd, MSW, MBA)  Occupational History   Not on file   Tobacco Use   Smoking status: Former    Types: Cigars   Smokeless tobacco: Never  Vaping Use   Vaping status: Never Used  Substance and Sexual Activity   Alcohol use: Yes    Alcohol/week: 3.0 standard drinks of alcohol    Types: 3 Cans of beer per week    Comment: occasionaly   Drug use: No   Sexual activity: Yes    Partners: Female    Comment: MARRIED  Other Topics Concern   Not on file  Social History Narrative   Not on file   Social Drivers of Health   Financial Resource Strain: Low Risk  (05/23/2023)   Overall Financial Resource Strain (CARDIA)    Difficulty of Paying Living Expenses: Not hard at all  Food Insecurity: No Food Insecurity (05/23/2023)   Hunger Vital Sign    Worried About Running Out of Food in the Last Year: Never true    Ran Out of Food in the Last Year: Never true  Transportation Needs: No Transportation Needs (05/23/2023)   PRAPARE - Administrator, Civil Service (Medical): No    Lack of Transportation (Non-Medical): No  Physical Activity: Sufficiently Active (05/23/2023)   Exercise Vital Sign    Days of Exercise per Week: 7 days    Minutes of Exercise per Session: 40 min  Stress: No Stress Concern Present (05/23/2023)   [employer] of Occupational Health - Occupational Stress Questionnaire    Feeling of Stress : Only a little  Social Connections: Socially Integrated (05/23/2023)   Social Connection and Isolation Panel [NHANES]    Frequency of Communication with Friends and Family: Twice a week    Frequency of Social Gatherings with Friends and Family: Once a week    Attends Religious Services: 1 to 4 times per year    Active Member of [employer] or Organizations: Yes    Attends Banker Meetings: 1 to 4 times per year    Marital Status: Married     Family History: The patient's family history includes Diabetes in his mother; Heart disease in his father and paternal grandfather; Hypertension in his father and mother.  ROS:    Please see the history of present illness.     All other systems reviewed and are negative.  EKGs/Labs/Other Studies Reviewed:    The following studies were reviewed today:  EKG:   01/06/2023: Sinus bradycardia, rate 53, nonspecific T wave flattening 07/06/2023: Normal sinus rhythm, rate 63, no ST abnormalities  Recent Labs: 05/23/2023: ALT 16; BUN 19; Creatinine, Ser 0.96; Hemoglobin 15.8; Platelets 215; Potassium 4.9; Sodium 142  Recent Lipid Panel    Component Value Date/Time   CHOL 125 05/23/2023 1023   TRIG 66 05/23/2023 1023   HDL 52 05/23/2023 1023   CHOLHDL  2.4 05/23/2023 1023   CHOLHDL 3.1 10/01/2021 0152   VLDL 21 10/01/2021 0152   LDLCALC 59 05/23/2023 1023    Physical Exam:    VS:  BP 118/70   Pulse 63   Ht 6\' 2"  (1.88 m)   Wt 179 lb 6.4 oz (81.4 kg)   SpO2 99%   BMI 23.03 kg/m     Wt Readings from Last 3 Encounters:  07/06/23 179 lb 6.4 oz (81.4 kg)  05/23/23 179 lb 9.6 oz (81.5 kg)  01/06/23 177 lb 9.6 oz (80.6 kg)     GEN:  Well nourished, well developed in no acute distress HEENT: Normal NECK: No JVD; No carotid bruits LYMPHATICS: No lymphadenopathy CARDIAC: RRR, no murmurs, rubs, gallops RESPIRATORY:  Clear to auscultation without rales, wheezing or rhonchi  ABDOMEN: Soft, non-tender, non-distended MUSCULOSKELETAL:  No edema; No deformity  SKIN: Warm and dry NEUROLOGIC:  Alert and oriented x 3 PSYCHIATRIC:  Normal affect   ASSESSMENT:    1. Coronary artery disease involving native coronary artery of native heart, unspecified whether angina present   2. Primary hypertension   3. Chest pain of uncertain etiology   4. Hyperlipidemia, unspecified hyperlipidemia type   5. PVCs (premature ventricular contractions)     PLAN:    CAD: hospitalized 09/2021 with chest pain, found to have NSTEMI.  LHC showed 50% stenosis of mid RCA followed by 90% stenosis of distal RCA.  Underwent successful PCI with DES to distal RCA.  Echocardiogram showed EF 60 to  65%. -Continue aspirin 81 mg daily.  Completed 1 year of DAPT, Plavix discontinued -Continue Toprol-XL 50 mg daily and amlodipine 2.5 mg daily -Continue atorvastatin 40 mg daily -He is reporting atypical chest pain that suspect is GI in etiology, however reports feels similar to what he felt before his MI.  Recommend stress PET to rule out ischemia  PVCs: Noted to have frequent PVCs during admission 09/2021 as well as NSVT.  Zio patch x 14 days 09/2021 showed 1 episode of NSVT lasting 6 beats, rare PVCs. -Continue Toprol-XL 50 mg daily  Hypertension: Continue Toprol-XL 50 mg daily and amlodipine 2.5 mg daily  Hyperlipidemia: Continue atorvastatin 40 mg daily, LDL 59 on 05/23/2023  RTC in 6 months  Informed Consent   Shared Decision Making/Informed Consent The risks [chest pain, shortness of breath, cardiac arrhythmias, dizziness, blood pressure fluctuations, myocardial infarction, stroke/transient ischemic attack, nausea, vomiting, allergic reaction, radiation exposure, metallic taste sensation and life-threatening complications (estimated to be 1 in 10,000)], benefits (risk stratification, diagnosing coronary artery disease, treatment guidance) and alternatives of a cardiac PET stress test were discussed in detail with Andre Martinez and he agrees to proceed.      Medication Adjustments/Labs and Tests Ordered: Current medicines are reviewed at length with the patient today.  Concerns regarding medicines are outlined above.  Orders Placed This Encounter  Procedures   NM PET CT CARDIAC PERFUSION MULTI W/ABSOLUTE BLOODFLOW   Cardiac Stress Test: Informed Consent Details: Physician/Practitioner Attestation; Transcribe to consent form and obtain patient signature   EKG 12-Lead   No orders of the defined types were placed in this encounter.   Patient Instructions  Medication Instructions:  Continue current medicaitons *If you need a refill on your cardiac medications before your next  appointment, please call your pharmacy*   Lab Work: none If you have labs (blood work) drawn today and your tests are completely normal, you will receive your results only by: MyChart Message (if you have MyChart) OR  A paper copy in the mail If you have any lab test that is abnormal or we need to change your treatment, we will call you to review the results.   Testing/Procedures:    Please report to Radiology at the [facility] Main Entrance 30 minutes early for your test.  [address] [place], [place] [address]                          How to Prepare for Your Cardiac PET/CT Stress Test:  Nothing to eat or drink, except water, 3 hours prior to arrival time.  NO caffeine/decaffeinated products, or chocolate 12 hours prior to arrival. (Please note decaffeinated beverages (teas/coffees) still contain caffeine).  If you have caffeine within 12 hours prior, the test will need to be rescheduled.  Medication instructions: Do not take erectile dysfunction medications for 72 hours prior to test (sildenafil, tadalafil) Do not take nitrates (isosorbide mononitrate, Ranexa) the day before or day of test Do not take tamsulosin the day before or morning of test Hold theophylline containing medications for 12 hours. Hold Dipyridamole 48 hours prior to the test.    You may take your remaining medications with water.  NO perfume, cologne or lotion on chest or abdomen area.   Total time is 1 to 2 hours; you may want to bring reading material for the waiting time.    In preparation for your appointment, medication and supplies will be purchased.  Appointment availability is limited, so if you need to cancel or reschedule, please call the Radiology Department Scheduler at [phone] 24 hours in advance to avoid a cancellation fee of $100.00  What to Expect When you Arrive:  Once you arrive and check in for your appointment, you will be taken to a preparation room within the  Radiology Department.  A technologist or Nurse will obtain your medical history, verify that you are correctly prepped for the exam, and explain the procedure.  Afterwards, an IV will be started in your arm and electrodes will be placed on your skin for EKG monitoring during the stress portion of the exam. Then you will be escorted to the PET/CT scanner.  There, staff will get you positioned on the scanner and obtain a blood pressure and EKG.  During the exam, you will continue to be connected to the EKG and blood pressure machines.  A small, safe amount of a radioactive tracer will be injected in your IV to obtain a series of pictures of your heart along with an injection of a stress agent.    After your Exam:  It is recommended that you eat a meal and drink a caffeinated beverage to counter act any effects of the stress agent.  Drink plenty of fluids for the remainder of the day and urinate frequently for the first couple of hours after the exam.  Your doctor will inform you of your test results within 7-10 business days.  For more information and frequently asked questions, please visit our website: [email or web address]  For questions about your test or how to prepare for your test, please call: Cardiac Imaging Nurse Navigators Office: [phone]    Follow-Up: At [facility], you and your health needs are our priority.  As part of our continuing mission to provide you with exceptional heart care, we have created designated Provider Care Teams.  These Care Teams include your primary Cardiologist (physician) and Advanced Practice Providers (APPs -  Physician  Assistants and Nurse Practitioners) who all work together to provide you with the care you need, when you need it.  We recommend signing up for the patient portal called "MyChart".  Sign up information is provided on this After Visit Summary.  MyChart is used to connect with patients for Virtual Visits (Telemedicine).   Patients are able to view lab/test results, encounter notes, upcoming appointments, etc.  Non-urgent messages can be sent to your provider as well.   To learn more about what you can do with MyChart, go to [email or web address].    Your next appointment:   6 month(s)  Provider:   Little Ishikawa, MD     Other Instructions none       Signed, Little Ishikawa, MD  07/06/2023 3:27 PM    [facility]

## 2023-07-06 ENCOUNTER — Encounter: Payer: Self-pay | Admitting: Cardiology

## 2023-07-06 ENCOUNTER — Ambulatory Visit: Payer: Medicare Other | Attending: Cardiology | Admitting: Cardiology

## 2023-07-06 VITALS — BP 118/70 | HR 63 | Ht 74.0 in | Wt 179.4 lb

## 2023-07-06 DIAGNOSIS — I2510 Atherosclerotic heart disease of native coronary artery without angina pectoris: Secondary | ICD-10-CM

## 2023-07-06 DIAGNOSIS — M1711 Unilateral primary osteoarthritis, right knee: Secondary | ICD-10-CM | POA: Diagnosis not present

## 2023-07-06 DIAGNOSIS — R079 Chest pain, unspecified: Secondary | ICD-10-CM

## 2023-07-06 DIAGNOSIS — I10 Essential (primary) hypertension: Secondary | ICD-10-CM | POA: Diagnosis not present

## 2023-07-06 DIAGNOSIS — E785 Hyperlipidemia, unspecified: Secondary | ICD-10-CM | POA: Diagnosis not present

## 2023-07-06 DIAGNOSIS — I493 Ventricular premature depolarization: Secondary | ICD-10-CM

## 2023-07-06 NOTE — Patient Instructions (Signed)
Medication Instructions:  Continue current medicaitons *If you need a refill on your cardiac medications before your next appointment, please call your pharmacy*   Lab Work: none If you have labs (blood work) drawn today and your tests are completely normal, you will receive your results only by: MyChart Message (if you have MyChart) OR A paper copy in the mail If you have any lab test that is abnormal or we need to change your treatment, we will call you to review the results.   Testing/Procedures:    Please report to Radiology at the [facility] Main Entrance 30 minutes early for your test.  [address] [place], [place] [address]                          How to Prepare for Your Cardiac PET/CT Stress Test:  Nothing to eat or drink, except water, 3 hours prior to arrival time.  NO caffeine/decaffeinated products, or chocolate 12 hours prior to arrival. (Please note decaffeinated beverages (teas/coffees) still contain caffeine).  If you have caffeine within 12 hours prior, the test will need to be rescheduled.  Medication instructions: Do not take erectile dysfunction medications for 72 hours prior to test (sildenafil, tadalafil) Do not take nitrates (isosorbide mononitrate, Ranexa) the day before or day of test Do not take tamsulosin the day before or morning of test Hold theophylline containing medications for 12 hours. Hold Dipyridamole 48 hours prior to the test.    You may take your remaining medications with water.  NO perfume, cologne or lotion on chest or abdomen area.   Total time is 1 to 2 hours; you may want to bring reading material for the waiting time.    In preparation for your appointment, medication and supplies will be purchased.  Appointment availability is limited, so if you need to cancel or reschedule, please call the Radiology Department Scheduler at [phone] 24 hours in advance to avoid a cancellation fee of $100.00  What to  Expect When you Arrive:  Once you arrive and check in for your appointment, you will be taken to a preparation room within the Radiology Department.  A technologist or Nurse will obtain your medical history, verify that you are correctly prepped for the exam, and explain the procedure.  Afterwards, an IV will be started in your arm and electrodes will be placed on your skin for EKG monitoring during the stress portion of the exam. Then you will be escorted to the PET/CT scanner.  There, staff will get you positioned on the scanner and obtain a blood pressure and EKG.  During the exam, you will continue to be connected to the EKG and blood pressure machines.  A small, safe amount of a radioactive tracer will be injected in your IV to obtain a series of pictures of your heart along with an injection of a stress agent.    After your Exam:  It is recommended that you eat a meal and drink a caffeinated beverage to counter act any effects of the stress agent.  Drink plenty of fluids for the remainder of the day and urinate frequently for the first couple of hours after the exam.  Your doctor will inform you of your test results within 7-10 business days.  For more information and frequently asked questions, please visit our website: [email or web address]  For questions about your test or how to prepare for your test, please call: Cardiac Imaging Nurse Navigators  Office: [phone]    Follow-Up: At [facility], you and your health needs are our priority.  As part of our continuing mission to provide you with exceptional heart care, we have created designated Provider Care Teams.  These Care Teams include your primary Cardiologist (physician) and Advanced Practice Providers (APPs -  Physician Assistants and Nurse Practitioners) who all work together to provide you with the care you need, when you need it.  We recommend signing up for the patient portal called "MyChart".  Sign up  information is provided on this After Visit Summary.  MyChart is used to connect with patients for Virtual Visits (Telemedicine).  Patients are able to view lab/test results, encounter notes, upcoming appointments, etc.  Non-urgent messages can be sent to your provider as well.   To learn more about what you can do with MyChart, go to [email or web address].    Your next appointment:   6 month(s)  Provider:   Little Ishikawa, MD     Other Instructions none

## 2023-07-11 ENCOUNTER — Encounter: Payer: Self-pay | Admitting: Internal Medicine

## 2023-07-13 DIAGNOSIS — M1711 Unilateral primary osteoarthritis, right knee: Secondary | ICD-10-CM | POA: Diagnosis not present

## 2023-09-26 ENCOUNTER — Ambulatory Visit: Admitting: Family Medicine

## 2023-09-29 ENCOUNTER — Encounter (HOSPITAL_COMMUNITY): Payer: Self-pay

## 2023-10-02 ENCOUNTER — Telehealth (HOSPITAL_COMMUNITY): Payer: Self-pay | Admitting: Emergency Medicine

## 2023-10-02 NOTE — Telephone Encounter (Signed)
 Reaching out to patient to offer assistance regarding upcoming cardiac imaging study; pt verbalizes understanding of appt date/time, parking situation and where to check in, pre-test NPO status and medications ordered, and verified current allergies; name and call back number provided for further questions should they arise Rockwell Alexandria RN Navigator Cardiac Imaging [facility] [phone] office [phone] cell

## 2023-10-03 ENCOUNTER — Ambulatory Visit (HOSPITAL_COMMUNITY)
Admission: RE | Admit: 2023-10-03 | Discharge: 2023-10-03 | Disposition: A | Discharge status: Home / Self Care | Source: Ambulatory Visit | Attending: Cardiology | Admitting: Cardiology

## 2023-10-03 DIAGNOSIS — R079 Chest pain, unspecified: Secondary | ICD-10-CM | POA: Insufficient documentation

## 2023-10-03 DIAGNOSIS — I2510 Atherosclerotic heart disease of native coronary artery without angina pectoris: Secondary | ICD-10-CM | POA: Diagnosis not present

## 2023-10-03 MED ORDER — REGADENOSON 0.4 MG/5ML IV SOLN
0.4000 mg | Freq: Once | INTRAVENOUS | Status: AC
  Administered 2023-10-03: 0.4 mg via INTRAVENOUS

## 2023-10-03 MED ORDER — RUBIDIUM RB82 GENERATOR (RUBYFILL)
20.4300 | PACK | Freq: Once | INTRAVENOUS | Status: AC
  Administered 2023-10-03: 20.43 via INTRAVENOUS

## 2023-10-03 MED ORDER — RUBIDIUM RB82 GENERATOR (RUBYFILL)
20.5100 | PACK | Freq: Once | INTRAVENOUS | Status: AC
  Administered 2023-10-03: 20.51 via INTRAVENOUS

## 2023-10-03 MED ORDER — REGADENOSON 0.4 MG/5ML IV SOLN
INTRAVENOUS | Status: AC
  Filled 2023-10-03: qty 5

## 2023-10-03 NOTE — Progress Notes (Signed)
 Pt. Tolerated lexi scan well.

## 2023-10-04 ENCOUNTER — Ambulatory Visit: Payer: Self-pay | Admitting: Cardiology

## 2023-10-04 LAB — NM PET CT CARDIAC PERFUSION MULTI W/ABSOLUTE BLOODFLOW
LV dias vol: 91 mL (ref 62–150)
LV sys vol: 43 mL
MBFR: 1.78
Nuc Rest EF: 53 %
Nuc Stress EF: 65 %
Peak HR: 94 {beats}/min
Rest HR: 64 {beats}/min
Rest MBF: 0.81 ml/g/min
Rest Nuclear Isotope Dose: 20.5 mCi
SDS: 8
ST Depression (mm): 0 mm
Stress MBF: 1.44 ml/g/min
Stress Nuclear Isotope Dose: 20.4 mCi

## 2023-10-05 ENCOUNTER — Encounter (HOSPITAL_BASED_OUTPATIENT_CLINIC_OR_DEPARTMENT_OTHER): Payer: Self-pay | Admitting: Cardiology

## 2023-10-05 NOTE — Telephone Encounter (Signed)
 Wendie Hamburg, MD 10/04/2023  5:10 PM EDT     Stress test concerning for blockage in heart artery.  Will need to discuss cath.  Can we add him on to my schedule next week?  5/29 at 1140    The patient has been notified of the result and verbalized understanding.  All questions (if any) were answered.  Pt will come into the office to see Dr. Alda Amas on 5/29 at 1140.  Pt aware to arrive 20 mins prior to this appt.  Pt aware of new office location/address and to present to the 5th floor for check in.   Pt verbalized understanding and agrees with this plan.  Will make Dr. Lavern Potash covering RN aware that pt was contacted with results and agrees to this plan.

## 2023-10-10 ENCOUNTER — Encounter: Payer: Self-pay | Admitting: *Deleted

## 2023-10-11 ENCOUNTER — Ambulatory Visit: Admitting: Family Medicine

## 2023-10-12 ENCOUNTER — Ambulatory Visit: Admitting: Cardiology

## 2023-10-12 ENCOUNTER — Encounter: Payer: Self-pay | Admitting: Cardiology

## 2023-10-12 ENCOUNTER — Ambulatory Visit: Attending: Cardiology | Admitting: Cardiology

## 2023-10-12 VITALS — BP 102/64 | HR 68 | Ht 74.0 in | Wt 172.0 lb

## 2023-10-12 DIAGNOSIS — I493 Ventricular premature depolarization: Secondary | ICD-10-CM | POA: Diagnosis not present

## 2023-10-12 DIAGNOSIS — E785 Hyperlipidemia, unspecified: Secondary | ICD-10-CM | POA: Diagnosis not present

## 2023-10-12 DIAGNOSIS — I10 Essential (primary) hypertension: Secondary | ICD-10-CM | POA: Diagnosis not present

## 2023-10-12 DIAGNOSIS — R072 Precordial pain: Secondary | ICD-10-CM

## 2023-10-12 DIAGNOSIS — I2510 Atherosclerotic heart disease of native coronary artery without angina pectoris: Secondary | ICD-10-CM

## 2023-10-12 LAB — CBC

## 2023-10-12 MED ORDER — NITROGLYCERIN 0.4 MG SL SUBL: 0.4000 mg | SUBLINGUAL_TABLET | SUBLINGUAL | 12 refills | Status: AC | PRN

## 2023-10-12 NOTE — H&P (View-Only) (Signed)
 Cardiology Office Note:    Date:  10/12/2023   ID:  BRISON FIUMARA, DOB 04-Apr-1956, MRN [id number]  PCP:  Watson Hacking, MD  Cardiologist:  Wendie Hamburg, MD  Electrophysiologist:  None   Referring MD: Watson Hacking, MD   Chief Complaint  Patient presents with   Coronary Artery Disease    History of Present Illness:    Andre Martinez is a 68 y.o. male with a hx of CAD with NSTEMI and DES to RCA on 09/30/2021, hypertension, hyperlipidemia, PVCs, GERD who presents for follow-up.  He was hospitalized 09/2021 with chest pain, found to have NSTEMI.  LHC showed 50% stenosis of mid RCA followed by 90% stenosis of distal RCA.  Underwent successful PCI with DES to distal RCA.  Echocardiogram showed EF 60 to 65%.  He reported chest pain and underwent stress PET 09/2023, which was intermediate risk study showing mid to basal inferior ischemia.  Since last clinic visit, he reports that he continues to have chest pain, described as burning in chest.  He walks daily for 30 to 40 minutes, has not noted relationship with exertion.  However states that his chest pain feels similar to the pain he felt when he had an MI.  He reports has been having some lightheadedness and BP has been down to 90s when checks at home.   Past Medical History:  Diagnosis Date   Allergy    Arthritis    Coronary artery disease    GERD (gastroesophageal reflux disease)    Hemorrhoids    Hyperlipidemia    Hypertension    Sinusitis 12/94 3/95    Past Surgical History:  Procedure Laterality Date   CARDIAC CATHETERIZATION     COLONOSCOPY  2006   DR.HAYES   CORONARY STENT INTERVENTION N/A 09/30/2021   Procedure: CORONARY STENT INTERVENTION;  Surgeon: Lucendia Rusk, MD;  [facility] INVASIVE CV LAB;  Service: Cardiovascular;  Laterality: N/A;   LEFT HEART CATH AND CORONARY ANGIOGRAPHY N/A 09/30/2021   Procedure: LEFT HEART CATH AND CORONARY ANGIOGRAPHY;  Surgeon: Lucendia Rusk, MD;   Location: [facility] INVASIVE CV LAB;  Service: Cardiovascular;  Laterality: N/A;   rectal fissure     REPAIR   SKIN BIOPSY Left 07/25/2018   see report in chat   SKIN BIOPSY Left 07/08/2021   dysplastic compound nevs with severe atypia margin close   SKIN BIOPSY Left 11/04/2021   no residual dysplastic nevu   TONSILLECTOMY     TOTAL KNEE ARTHROPLASTY Left 05/21/2018   Procedure: LEFT TOTAL KNEE ARTHROPLASTY;  Surgeon: Liliane Rei, MD;  Location: [facility] ORS;  Service: Orthopedics;  Laterality: Left;    UPPER GI ENDOSCOPY  04/08/2013   normal esophagus,multiple gastric polyps,normal examined duodenum    Current Medications: Current Meds  Medication Sig   aspirin  81 MG chewable tablet Chew 1 tablet (81 mg total) by mouth daily.   atorvastatin  (LIPITOR ) 40 MG tablet Take 1 tablet (40 mg total) by mouth daily.   esomeprazole (NEXIUM) 20 MG capsule Take 20 mg by mouth as needed.   metoprolol  succinate (TOPROL -XL) 50 MG 24 hr tablet Take 1 tablet (50 mg total) by mouth daily. Take with or immediately following a meal.   Multiple Vitamin (MULTIVITAMIN) tablet Take 1 tablet by mouth in the morning. Centrum Silver   [DISCONTINUED] amLODipine  (NORVASC ) 2.5 MG tablet Take 1 tablet (2.5 mg total) by mouth daily.     Allergies:   Aciphex [rabeprazole], Codeine, Prevacid [lansoprazole],  and Penicillins   Social History   Socioeconomic History   Marital status: Married    Spouse name: Not on file   Number of children: Not on file   Years of education: Not on file   Highest education level: Master's degree (e.g., MA, MS, MEng, MEd, MSW, MBA)  Occupational History   Not on file  Tobacco Use   Smoking status: Former    Types: Cigars   Smokeless tobacco: Never  Vaping Use   Vaping status: Never Used  Substance and Sexual Activity   Alcohol use: Yes    Alcohol/week: 3.0 standard drinks of alcohol    Types: 3 Cans of beer per week    Comment: occasionaly   Drug use: No   Sexual activity:  Yes    Partners: Female    Comment: MARRIED  Other Topics Concern   Not on file  Social History Narrative   Not on file   Social Drivers of Health   Financial Resource Strain: Low Risk  (05/23/2023)   Overall Financial Resource Strain (CARDIA)    Difficulty of Paying Living Expenses: Not hard at all  Food Insecurity: No Food Insecurity (05/23/2023)   Hunger Vital Sign    Worried About Running Out of Food in the Last Year: Never true    Ran Out of Food in the Last Year: Never true  Transportation Needs: No Transportation Needs (05/23/2023)   PRAPARE - Administrator, Civil Service (Medical): No    Lack of Transportation (Non-Medical): No  Physical Activity: Sufficiently Active (05/23/2023)   Exercise Vital Sign    Days of Exercise per Week: 7 days    Minutes of Exercise per Session: 40 min  Stress: No Stress Concern Present (05/23/2023)   [employer] of Occupational Health - Occupational Stress Questionnaire    Feeling of Stress : Only a little  Social Connections: Socially Integrated (05/23/2023)   Social Connection and Isolation Panel [NHANES]    Frequency of Communication with Friends and Family: Twice a week    Frequency of Social Gatherings with Friends and Family: Once a week    Attends Religious Services: 1 to 4 times per year    Active Member of [employer] or Organizations: Yes    Attends Banker Meetings: 1 to 4 times per year    Marital Status: Married     Family History: The patient's family history includes Diabetes in his mother; Heart disease in his father and paternal grandfather; Hypertension in his father and mother.  ROS:   Please see the history of present illness.     All other systems reviewed and are negative.  EKGs/Labs/Other Studies Reviewed:    The following studies were reviewed today:  EKG:   01/06/2023: Sinus bradycardia, rate 53, nonspecific T wave flattening 07/06/2023: Normal sinus rhythm, rate 63, no ST  abnormalities 10/12/2023: Sinus bradycardia, rate 57  Recent Labs: 05/23/2023: ALT 16; BUN 19; Creatinine, Ser 0.96; Hemoglobin 15.8; Platelets 215; Potassium 4.9; Sodium 142  Recent Lipid Panel    Component Value Date/Time   CHOL 125 05/23/2023 1023   TRIG 66 05/23/2023 1023   HDL 52 05/23/2023 1023   CHOLHDL 2.4 05/23/2023 1023   CHOLHDL 3.1 10/01/2021 0152   VLDL 21 10/01/2021 0152   LDLCALC 59 05/23/2023 1023    Physical Exam:    VS:  BP 102/64 (BP Location: Left Arm, Patient Position: Sitting, Cuff Size: Normal)   Pulse 68   Ht 6\' 2"  (1.88  m)   Wt 172 lb (78 kg)   SpO2 97%   BMI 22.08 kg/m     Wt Readings from Last 3 Encounters:  10/12/23 172 lb (78 kg)  07/06/23 179 lb 6.4 oz (81.4 kg)  05/23/23 179 lb 9.6 oz (81.5 kg)     GEN:  Well nourished, well developed in no acute distress HEENT: Normal NECK: No JVD; No carotid bruits LYMPHATICS: No lymphadenopathy CARDIAC: RRR, no murmurs, rubs, gallops RESPIRATORY:  Clear to auscultation without rales, wheezing or rhonchi  ABDOMEN: Soft, non-tender, non-distended MUSCULOSKELETAL:  No edema; No deformity  SKIN: Warm and dry NEUROLOGIC:  Alert and oriented x 3 PSYCHIATRIC:  Normal affect   ASSESSMENT:    1. Coronary artery disease involving native coronary artery of native heart, unspecified whether angina present   2. Precordial chest pain   3. Primary hypertension   4. Hyperlipidemia, unspecified hyperlipidemia type   5. PVCs (premature ventricular contractions)     PLAN:    CAD: hospitalized 09/2021 with chest pain, found to have NSTEMI.  LHC showed 50% stenosis of mid RCA followed by 90% stenosis of distal RCA.  Underwent successful PCI with DES to distal RCA.  Echocardiogram showed EF 60 to 65%. -Continue aspirin  81 mg daily.  Completed 1 year of DAPT, Plavix  discontinued -Continue Toprol -XL 50 mg daily.  Will discontinue amlodipine  given soft BP -Continue atorvastatin  40 mg daily -He reported atypical chest  pain and underwent stress PET 09/2023, which was intermediate risk study showing mid to basal inferior ischemia.  Recommend cardiac catheterization.  Risks and benefits of cardiac catheterization have been discussed with the patient.  These include bleeding, infection, kidney damage, stroke, heart attack, death.  The patient understands these risks and is willing to proceed.  - Sublingual nitroglycerin  as needed  PVCs: Noted to have frequent PVCs during admission 09/2021 as well as NSVT.  Zio patch x 14 days 09/2021 showed 1 episode of NSVT lasting 6 beats, rare PVCs. -Continue Toprol -XL 50 mg daily  Hypertension: Continue Toprol -XL 50 mg daily.  BP has been soft, will discontinue amlodipine   Hyperlipidemia: Continue atorvastatin  40 mg daily, LDL 59 on 05/23/2023  RTC in 1 month   Medication Adjustments/Labs and Tests Ordered: Current medicines are reviewed at length with the patient today.  Concerns regarding medicines are outlined above.  Orders Placed This Encounter  Procedures   CBC   Basic metabolic panel with GFR   EKG [id number]   Meds ordered this encounter  Medications   nitroGLYCERIN  (NITROSTAT ) 0.4 MG SL tablet    Sig: Place 1 tablet (0.4 mg total) under the tongue every 5 (five) minutes x 3 doses as needed for chest pain. Call 911 if you need a fourth dose in a 24 period    Dispense:  25 tablet    Refill:  12    Patient Instructions  Medication Instructions:  STOP: Amlodipine  (Norvasc )  *If you need a refill on your cardiac medications before your next appointment, please call your pharmacy*  Lab Work: Today: BMET and CBC  If you have labs (blood work) drawn today and your tests are completely normal, you will receive your results only by: MyChart Message (if you have MyChart) OR A paper copy in the mail If you have any lab test that is abnormal or we need to change your treatment, we will call you to review the results.  Testing/Procedures: Your physician has  requested that you have a cardiac catheterization. Cardiac catheterization is used  to diagnose and/or treat various heart conditions. Doctors may recommend this procedure for a number of different reasons. The most common reason is to evaluate chest pain. Chest pain can be a symptom of coronary artery disease (CAD), and cardiac catheterization can show whether plaque is narrowing or blocking your heart's arteries. This procedure is also used to evaluate the valves, as well as measure the blood flow and oxygen levels in different parts of your heart. For further information please visit [email or web address] Please follow instruction sheet, as given.   Follow-Up: At [facility], you and your health needs are our priority.  As part of our continuing mission to provide you with exceptional heart care, our providers are all part of one team.  This team includes your primary Cardiologist (physician) and Advanced Practice Providers or APPs (Physician Assistants and Nurse Practitioners) who all work together to provide you with the care you need, when you need it.  Your next appointment:    11/08/2023 at 10:40 am  Provider:   Wendie Hamburg, MD   OTHER INSTRUCTIONS   BASHAR MILAM  10/12/2023  You are scheduled for a Cardiac Catheterization on Wednesday, June 4 with Dr. Randene Bustard.  1. Please arrive at the [facility] (Main Entrance A) at [facility]: [address] [place], [place] [address] at 11:00 AM (This time is 2 hour(s) before your procedure to ensure your preparation).   Free valet parking service is available. You will check in at ADMITTING. The support person will be asked to wait in the waiting room.  It is OK to have someone drop you off and come back when you are ready to be discharged.    Special note: Every effort is made to have your procedure done on time. Please understand that emergencies sometimes delay scheduled procedures.  2. Diet: Do not eat  solid foods after midnight.  The patient may have clear liquids until 5am upon the day of the procedure.  3. Labs: You will need to have blood drawn on TODAY at [facility] on 1st floor.   4. Medication instructions in preparation for your procedure:   Contrast Allergy: No    On the morning of your procedure, take your Aspirin  81 mg and any morning medicines NOT listed above.  You may use sips of water.  5. Plan to go home the same day, you will only stay overnight if medically necessary. 6. Bring a current list of your medications and current insurance cards. 7. You MUST have a responsible person to drive you home. 8. Someone MUST be with you the first 24 hours after you arrive home or your discharge will be delayed. 9. Please wear clothes that are easy to get on and off and wear slip-on shoes.  Thank you for allowing us  to care for you!   -- [facility] Invasive Cardiovascular services        Signed, Wendie Hamburg, MD  10/12/2023 12:07 PM    [facility]

## 2023-10-12 NOTE — Progress Notes (Signed)
 Cardiology Office Note:    Date:  10/12/2023   ID:  Andre Martinez, DOB 04-Apr-1956, MRN [id number]  PCP:  Watson Hacking, MD  Cardiologist:  Wendie Hamburg, MD  Electrophysiologist:  None   Referring MD: Watson Hacking, MD   Chief Complaint  Patient presents with   Coronary Artery Disease    History of Present Illness:    Andre Martinez is a 68 y.o. male with a hx of CAD with NSTEMI and DES to RCA on 09/30/2021, hypertension, hyperlipidemia, PVCs, GERD who presents for follow-up.  He was hospitalized 09/2021 with chest pain, found to have NSTEMI.  LHC showed 50% stenosis of mid RCA followed by 90% stenosis of distal RCA.  Underwent successful PCI with DES to distal RCA.  Echocardiogram showed EF 60 to 65%.  He reported chest pain and underwent stress PET 09/2023, which was intermediate risk study showing mid to basal inferior ischemia.  Since last clinic visit, he reports that he continues to have chest pain, described as burning in chest.  He walks daily for 30 to 40 minutes, has not noted relationship with exertion.  However states that his chest pain feels similar to the pain he felt when he had an MI.  He reports has been having some lightheadedness and BP has been down to 90s when checks at home.   Past Medical History:  Diagnosis Date   Allergy    Arthritis    Coronary artery disease    GERD (gastroesophageal reflux disease)    Hemorrhoids    Hyperlipidemia    Hypertension    Sinusitis 12/94 3/95    Past Surgical History:  Procedure Laterality Date   CARDIAC CATHETERIZATION     COLONOSCOPY  2006   DR.HAYES   CORONARY STENT INTERVENTION N/A 09/30/2021   Procedure: CORONARY STENT INTERVENTION;  Surgeon: Lucendia Rusk, MD;  Location[facility] INVASIVE CV LAB;  Service: Cardiovascular;  Laterality: N/A;   LEFT HEART CATH AND CORONARY ANGIOGRAPHY N/A 09/30/2021   Procedure: LEFT HEART CATH AND CORONARY ANGIOGRAPHY;  Surgeon: Lucendia Rusk, MD;   Location: [facility] INVASIVE CV LAB;  Service: Cardiovascular;  Laterality: N/A;   rectal fissure     REPAIR   SKIN BIOPSY Left 07/25/2018   see report in chat   SKIN BIOPSY Left 07/08/2021   dysplastic compound nevs with severe atypia margin close   SKIN BIOPSY Left 11/04/2021   no residual dysplastic nevu   TONSILLECTOMY     TOTAL KNEE ARTHROPLASTY Left 05/21/2018   Procedure: LEFT TOTAL KNEE ARTHROPLASTY;  Surgeon: Liliane Rei, MD;  Location: [facility] ORS;  Service: Orthopedics;  Laterality: Left;    UPPER GI ENDOSCOPY  04/08/2013   normal esophagus,multiple gastric polyps,normal examined duodenum    Current Medications: Current Meds  Medication Sig   aspirin  81 MG chewable tablet Chew 1 tablet (81 mg total) by mouth daily.   atorvastatin  (LIPITOR ) 40 MG tablet Take 1 tablet (40 mg total) by mouth daily.   esomeprazole (NEXIUM) 20 MG capsule Take 20 mg by mouth as needed.   metoprolol  succinate (TOPROL -XL) 50 MG 24 hr tablet Take 1 tablet (50 mg total) by mouth daily. Take with or immediately following a meal.   Multiple Vitamin (MULTIVITAMIN) tablet Take 1 tablet by mouth in the morning. Centrum Silver   [DISCONTINUED] amLODipine  (NORVASC ) 2.5 MG tablet Take 1 tablet (2.5 mg total) by mouth daily.     Allergies:   Aciphex [rabeprazole], Codeine, Prevacid [lansoprazole],  and Penicillins   Social History   Socioeconomic History   Marital status: Married    Spouse name: Not on file   Number of children: Not on file   Years of education: Not on file   Highest education level: Master's degree (e.g., MA, MS, MEng, MEd, MSW, MBA)  Occupational History   Not on file  Tobacco Use   Smoking status: Former    Types: Cigars   Smokeless tobacco: Never  Vaping Use   Vaping status: Never Used  Substance and Sexual Activity   Alcohol use: Yes    Alcohol/week: 3.0 standard drinks of alcohol    Types: 3 Cans of beer per week    Comment: occasionaly   Drug use: No   Sexual activity:  Yes    Partners: Female    Comment: MARRIED  Other Topics Concern   Not on file  Social History Narrative   Not on file   Social Drivers of Health   Financial Resource Strain: Low Risk  (05/23/2023)   Overall Financial Resource Strain (CARDIA)    Difficulty of Paying Living Expenses: Not hard at all  Food Insecurity: No Food Insecurity (05/23/2023)   Hunger Vital Sign    Worried About Running Out of Food in the Last Year: Never true    Ran Out of Food in the Last Year: Never true  Transportation Needs: No Transportation Needs (05/23/2023)   PRAPARE - Administrator, Civil Service (Medical): No    Lack of Transportation (Non-Medical): No  Physical Activity: Sufficiently Active (05/23/2023)   Exercise Vital Sign    Days of Exercise per Week: 7 days    Minutes of Exercise per Session: 40 min  Stress: No Stress Concern Present (05/23/2023)   [employer] of Occupational Health - Occupational Stress Questionnaire    Feeling of Stress : Only a little  Social Connections: Socially Integrated (05/23/2023)   Social Connection and Isolation Panel [NHANES]    Frequency of Communication with Friends and Family: Twice a week    Frequency of Social Gatherings with Friends and Family: Once a week    Attends Religious Services: 1 to 4 times per year    Active Member of [employer] or Organizations: Yes    Attends Banker Meetings: 1 to 4 times per year    Marital Status: Married     Family History: The patient's family history includes Diabetes in his mother; Heart disease in his father and paternal grandfather; Hypertension in his father and mother.  ROS:   Please see the history of present illness.     All other systems reviewed and are negative.  EKGs/Labs/Other Studies Reviewed:    The following studies were reviewed today:  EKG:   01/06/2023: Sinus bradycardia, rate 53, nonspecific T wave flattening 07/06/2023: Normal sinus rhythm, rate 63, no ST  abnormalities 10/12/2023: Sinus bradycardia, rate 57  Recent Labs: 05/23/2023: ALT 16; BUN 19; Creatinine, Ser 0.96; Hemoglobin 15.8; Platelets 215; Potassium 4.9; Sodium 142  Recent Lipid Panel    Component Value Date/Time   CHOL 125 05/23/2023 1023   TRIG 66 05/23/2023 1023   HDL 52 05/23/2023 1023   CHOLHDL 2.4 05/23/2023 1023   CHOLHDL 3.1 10/01/2021 0152   VLDL 21 10/01/2021 0152   LDLCALC 59 05/23/2023 1023    Physical Exam:    VS:  BP 102/64 (BP Location: Left Arm, Patient Position: Sitting, Cuff Size: Normal)   Pulse 68   Ht 6\' 2"  (1.88  m)   Wt 172 lb (78 kg)   SpO2 97%   BMI 22.08 kg/m     Wt Readings from Last 3 Encounters:  10/12/23 172 lb (78 kg)  07/06/23 179 lb 6.4 oz (81.4 kg)  05/23/23 179 lb 9.6 oz (81.5 kg)     GEN:  Well nourished, well developed in no acute distress HEENT: Normal NECK: No JVD; No carotid bruits LYMPHATICS: No lymphadenopathy CARDIAC: RRR, no murmurs, rubs, gallops RESPIRATORY:  Clear to auscultation without rales, wheezing or rhonchi  ABDOMEN: Soft, non-tender, non-distended MUSCULOSKELETAL:  No edema; No deformity  SKIN: Warm and dry NEUROLOGIC:  Alert and oriented x 3 PSYCHIATRIC:  Normal affect   ASSESSMENT:    1. Coronary artery disease involving native coronary artery of native heart, unspecified whether angina present   2. Precordial chest pain   3. Primary hypertension   4. Hyperlipidemia, unspecified hyperlipidemia type   5. PVCs (premature ventricular contractions)     PLAN:    CAD: hospitalized 09/2021 with chest pain, found to have NSTEMI.  LHC showed 50% stenosis of mid RCA followed by 90% stenosis of distal RCA.  Underwent successful PCI with DES to distal RCA.  Echocardiogram showed EF 60 to 65%. -Continue aspirin  81 mg daily.  Completed 1 year of DAPT, Plavix  discontinued -Continue Toprol -XL 50 mg daily.  Will discontinue amlodipine  given soft BP -Continue atorvastatin  40 mg daily -He reported atypical chest  pain and underwent stress PET 09/2023, which was intermediate risk study showing mid to basal inferior ischemia.  Recommend cardiac catheterization.  Risks and benefits of cardiac catheterization have been discussed with the patient.  These include bleeding, infection, kidney damage, stroke, heart attack, death.  The patient understands these risks and is willing to proceed.  - Sublingual nitroglycerin  as needed  PVCs: Noted to have frequent PVCs during admission 09/2021 as well as NSVT.  Zio patch x 14 days 09/2021 showed 1 episode of NSVT lasting 6 beats, rare PVCs. -Continue Toprol -XL 50 mg daily  Hypertension: Continue Toprol -XL 50 mg daily.  BP has been soft, will discontinue amlodipine   Hyperlipidemia: Continue atorvastatin  40 mg daily, LDL 59 on 05/23/2023  RTC in 1 month   Medication Adjustments/Labs and Tests Ordered: Current medicines are reviewed at length with the patient today.  Concerns regarding medicines are outlined above.  Orders Placed This Encounter  Procedures   CBC   Basic metabolic panel with GFR   EKG [id number]   Meds ordered this encounter  Medications   nitroGLYCERIN  (NITROSTAT ) 0.4 MG SL tablet    Sig: Place 1 tablet (0.4 mg total) under the tongue every 5 (five) minutes x 3 doses as needed for chest pain. Call 911 if you need a fourth dose in a 24 period    Dispense:  25 tablet    Refill:  12    Patient Instructions  Medication Instructions:  STOP: Amlodipine  (Norvasc )  *If you need a refill on your cardiac medications before your next appointment, please call your pharmacy*  Lab Work: Today: BMET and CBC  If you have labs (blood work) drawn today and your tests are completely normal, you will receive your results only by: MyChart Message (if you have MyChart) OR A paper copy in the mail If you have any lab test that is abnormal or we need to change your treatment, we will call you to review the results.  Testing/Procedures: Your physician has  requested that you have a cardiac catheterization. Cardiac catheterization is used  to diagnose and/or treat various heart conditions. Doctors may recommend this procedure for a number of different reasons. The most common reason is to evaluate chest pain. Chest pain can be a symptom of coronary artery disease (CAD), and cardiac catheterization can show whether plaque is narrowing or blocking your heart's arteries. This procedure is also used to evaluate the valves, as well as measure the blood flow and oxygen levels in different parts of your heart. For further information please visit [email or web address] Please follow instruction sheet, as given.   Follow-Up: At [facility], you and your health needs are our priority.  As part of our continuing mission to provide you with exceptional heart care, our providers are all part of one team.  This team includes your primary Cardiologist (physician) and Advanced Practice Providers or APPs (Physician Assistants and Nurse Practitioners) who all work together to provide you with the care you need, when you need it.  Your next appointment:    11/08/2023 at 10:40 am  Provider:   Wendie Hamburg, MD   OTHER INSTRUCTIONS   Andre Martinez  10/12/2023  You are scheduled for a Cardiac Catheterization on Wednesday, June 4 with Dr. Randene Bustard.  1. Please arrive at the [facility] (Main Entrance A) at [facility]: [address] [place], [place] [address] at 11:00 AM (This time is 2 hour(s) before your procedure to ensure your preparation).   Free valet parking service is available. You will check in at ADMITTING. The support person will be asked to wait in the waiting room.  It is OK to have someone drop you off and come back when you are ready to be discharged.    Special note: Every effort is made to have your procedure done on time. Please understand that emergencies sometimes delay scheduled procedures.  2. Diet: Do not eat  solid foods after midnight.  The patient may have clear liquids until 5am upon the day of the procedure.  3. Labs: You will need to have blood drawn on TODAY at [facility] on 1st floor.   4. Medication instructions in preparation for your procedure:   Contrast Allergy: No    On the morning of your procedure, take your Aspirin  81 mg and any morning medicines NOT listed above.  You may use sips of water.  5. Plan to go home the same day, you will only stay overnight if medically necessary. 6. Bring a current list of your medications and current insurance cards. 7. You MUST have a responsible person to drive you home. 8. Someone MUST be with you the first 24 hours after you arrive home or your discharge will be delayed. 9. Please wear clothes that are easy to get on and off and wear slip-on shoes.  Thank you for allowing us  to care for you!   -- [facility] Invasive Cardiovascular services        Signed, Wendie Hamburg, MD  10/12/2023 12:07 PM    [facility]

## 2023-10-12 NOTE — Patient Instructions (Addendum)
 Medication Instructions:  STOP: Amlodipine  (Norvasc )  *If you need a refill on your cardiac medications before your next appointment, please call your pharmacy*  Lab Work: Today: BMET and CBC  If you have labs (blood work) drawn today and your tests are completely normal, you will receive your results only by: MyChart Message (if you have MyChart) OR A paper copy in the mail If you have any lab test that is abnormal or we need to change your treatment, we will call you to review the results.  Testing/Procedures: Your physician has requested that you have a cardiac catheterization. Cardiac catheterization is used to diagnose and/or treat various heart conditions. Doctors may recommend this procedure for a number of different reasons. The most common reason is to evaluate chest pain. Chest pain can be a symptom of coronary artery disease (CAD), and cardiac catheterization can show whether plaque is narrowing or blocking your heart's arteries. This procedure is also used to evaluate the valves, as well as measure the blood flow and oxygen levels in different parts of your heart. For further information please visit [email or web address] Please follow instruction sheet, as given.   Follow-Up: At [facility], you and your health needs are our priority.  As part of our continuing mission to provide you with exceptional heart care, our providers are all part of one team.  This team includes your primary Cardiologist (physician) and Advanced Practice Providers or APPs (Physician Assistants and Nurse Practitioners) who all work together to provide you with the care you need, when you need it.  Your next appointment:    11/08/2023 at 10:40 am  Provider:   Wendie Hamburg, MD   OTHER INSTRUCTIONS   Andre Martinez  10/12/2023  You are scheduled for a Cardiac Catheterization on Wednesday, June 4 with Dr. Randene Bustard.  1. Please arrive at the [facility] (Main Entrance A) at  [facility]: [address] [place], [place] [address] at 11:00 AM (This time is 2 hour(s) before your procedure to ensure your preparation).   Free valet parking service is available. You will check in at ADMITTING. The support person will be asked to wait in the waiting room.  It is OK to have someone drop you off and come back when you are ready to be discharged.    Special note: Every effort is made to have your procedure done on time. Please understand that emergencies sometimes delay scheduled procedures.  2. Diet: Do not eat solid foods after midnight.  The patient may have clear liquids until 5am upon the day of the procedure.  3. Labs: You will need to have blood drawn on TODAY at [facility] on 1st floor.   4. Medication instructions in preparation for your procedure:   Contrast Allergy: No    On the morning of your procedure, take your Aspirin  81 mg and any morning medicines NOT listed above.  You may use sips of water.  5. Plan to go home the same day, you will only stay overnight if medically necessary. 6. Bring a current list of your medications and current insurance cards. 7. You MUST have a responsible person to drive you home. 8. Someone MUST be with you the first 24 hours after you arrive home or your discharge will be delayed. 9. Please wear clothes that are easy to get on and off and wear slip-on shoes.  Thank you for allowing us  to care for you!   -- [facility] Cardi[facility]  services

## 2023-10-13 ENCOUNTER — Ambulatory Visit: Payer: Self-pay | Admitting: Cardiology

## 2023-10-13 LAB — BASIC METABOLIC PANEL WITH GFR
BUN/Creatinine Ratio: 19 (ref 10–24)
BUN: 17 mg/dL (ref 8–27)
CO2: 23 mmol/L (ref 20–29)
Calcium: 9.4 mg/dL (ref 8.6–10.2)
Chloride: 103 mmol/L (ref 96–106)
Creatinine, Ser: 0.89 mg/dL (ref 0.76–1.27)
Glucose: 82 mg/dL (ref 70–99)
Potassium: 5 mmol/L (ref 3.5–5.2)
Sodium: 142 mmol/L (ref 134–144)
eGFR: 93 mL/min/{1.73_m2} (ref >59)

## 2023-10-13 LAB — CBC
Hematocrit: 47.8 % (ref 37.5–51.0)
Hemoglobin: 15.6 g/dL (ref 13.0–17.7)
MCH: 30.2 pg (ref 26.6–33.0)
MCHC: 32.6 g/dL (ref 31.5–35.7)
MCV: 93 fL (ref 79–97)
Platelets: 210 10*3/uL (ref 150–450)
RBC: 5.16 x10E6/uL (ref 4.14–5.80)
RDW: 12.5 % (ref 11.6–15.4)
WBC: 8.7 10*3/uL (ref 3.4–10.8)

## 2023-10-17 ENCOUNTER — Telehealth: Payer: Self-pay | Admitting: *Deleted

## 2023-10-17 DIAGNOSIS — H2513 Age-related nuclear cataract, bilateral: Secondary | ICD-10-CM | POA: Diagnosis not present

## 2023-10-17 DIAGNOSIS — H35033 Hypertensive retinopathy, bilateral: Secondary | ICD-10-CM | POA: Diagnosis not present

## 2023-10-17 DIAGNOSIS — H02821 Cysts of right upper eyelid: Secondary | ICD-10-CM | POA: Diagnosis not present

## 2023-10-17 NOTE — Telephone Encounter (Signed)
 Cardiac Catheterization scheduled at [facility] for: Wednesday October 18, 2023 1 PM Arrival time [facility] Main Entrance A at: 11 AM  Nothing to eat after midnight prior to procedure, clear liquids until 5 AM day of procedure.  Medication instructions: -Usual morning medications can be taken with sips of water including aspirin  81 mg.  Plan to go home the same day, you will only stay overnight if medically necessary.  You must have responsible adult to drive you home.  Someone must be with you the first 24 hours after you arrive home.  Left message for patient to call back to review procedure instructions

## 2023-10-17 NOTE — Telephone Encounter (Signed)
 Patient is returning phone call.

## 2023-10-17 NOTE — Telephone Encounter (Signed)
 Reviewed procedure instructions with patient.

## 2023-10-18 ENCOUNTER — Encounter (HOSPITAL_COMMUNITY)
Admission: RE | Disposition: A | Discharge status: Home / Self Care | Payer: Self-pay | Source: Home / Self Care | Attending: Cardiology

## 2023-10-18 ENCOUNTER — Other Ambulatory Visit: Payer: Self-pay

## 2023-10-18 ENCOUNTER — Ambulatory Visit (HOSPITAL_COMMUNITY)
Admission: RE | Admit: 2023-10-18 | Discharge: 2023-10-18 | Disposition: A | Discharge status: Home / Self Care | Attending: Cardiology | Admitting: Cardiology

## 2023-10-18 ENCOUNTER — Encounter (HOSPITAL_COMMUNITY): Payer: Self-pay | Admitting: Cardiology

## 2023-10-18 DIAGNOSIS — I25758 Atherosclerosis of native coronary artery of transplanted heart with other forms of angina pectoris: Secondary | ICD-10-CM | POA: Diagnosis not present

## 2023-10-18 DIAGNOSIS — I2510 Atherosclerotic heart disease of native coronary artery without angina pectoris: Secondary | ICD-10-CM | POA: Diagnosis not present

## 2023-10-18 DIAGNOSIS — Z7982 Long term (current) use of aspirin: Secondary | ICD-10-CM | POA: Insufficient documentation

## 2023-10-18 DIAGNOSIS — E785 Hyperlipidemia, unspecified: Secondary | ICD-10-CM | POA: Insufficient documentation

## 2023-10-18 DIAGNOSIS — I10 Essential (primary) hypertension: Secondary | ICD-10-CM | POA: Insufficient documentation

## 2023-10-18 DIAGNOSIS — I493 Ventricular premature depolarization: Secondary | ICD-10-CM | POA: Diagnosis not present

## 2023-10-18 DIAGNOSIS — Z87891 Personal history of nicotine dependence: Secondary | ICD-10-CM | POA: Insufficient documentation

## 2023-10-18 DIAGNOSIS — Z955 Presence of coronary angioplasty implant and graft: Secondary | ICD-10-CM | POA: Diagnosis not present

## 2023-10-18 DIAGNOSIS — I252 Old myocardial infarction: Secondary | ICD-10-CM | POA: Insufficient documentation

## 2023-10-18 DIAGNOSIS — Z79899 Other long term (current) drug therapy: Secondary | ICD-10-CM | POA: Diagnosis not present

## 2023-10-18 DIAGNOSIS — R9439 Abnormal result of other cardiovascular function study: Secondary | ICD-10-CM | POA: Diagnosis not present

## 2023-10-18 HISTORY — PX: CORONARY PRESSURE/FFR STUDY: CATH118243

## 2023-10-18 HISTORY — PX: LEFT HEART CATH AND CORONARY ANGIOGRAPHY: CATH118249

## 2023-10-18 LAB — POCT ACTIVATED CLOTTING TIME: Activated Clotting Time: 331 s

## 2023-10-18 SURGERY — LEFT HEART CATH AND CORONARY ANGIOGRAPHY
Anesthesia: LOCAL

## 2023-10-18 MED ORDER — VERAPAMIL HCL 2.5 MG/ML IV SOLN
INTRAVENOUS | Status: DC | PRN
  Administered 2023-10-18: 10 mL via INTRA_ARTERIAL

## 2023-10-18 MED ORDER — SODIUM CHLORIDE 0.9 % IV SOLN: INTRAVENOUS | Status: DC

## 2023-10-18 MED ORDER — MIDAZOLAM HCL 2 MG/2ML IJ SOLN
INTRAMUSCULAR | Status: AC
Start: 2023-10-18 — End: ?
  Filled 2023-10-18: qty 2

## 2023-10-18 MED ORDER — HEPARIN SODIUM (PORCINE) 1000 UNIT/ML IJ SOLN
INTRAMUSCULAR | Status: AC
  Filled 2023-10-18: qty 10

## 2023-10-18 MED ORDER — NITROGLYCERIN 1 MG/10 ML FOR IR/CATH LAB
INTRA_ARTERIAL | Status: DC | PRN
  Administered 2023-10-18: 100 ug via INTRACORONARY
  Administered 2023-10-18: 200 ug via INTRACORONARY

## 2023-10-18 MED ORDER — SODIUM CHLORIDE 0.9 % WEIGHT BASED INFUSION: 1.0000 mL/kg/h | INTRAVENOUS | Status: DC

## 2023-10-18 MED ORDER — SODIUM CHLORIDE 0.9% FLUSH
3.0000 mL | INTRAVENOUS | Status: DC | PRN
Start: 2023-10-18 — End: 2023-10-18

## 2023-10-18 MED ORDER — ONDANSETRON HCL 4 MG/2ML IJ SOLN: 4.0000 mg | Freq: Four times a day (QID) | INTRAMUSCULAR | Status: DC | PRN

## 2023-10-18 MED ORDER — ACETAMINOPHEN 325 MG PO TABS: 650.0000 mg | ORAL_TABLET | ORAL | Status: DC | PRN

## 2023-10-18 MED ORDER — IOHEXOL 350 MG/ML SOLN
INTRAVENOUS | Status: DC | PRN
  Administered 2023-10-18: 60 mL via INTRA_ARTERIAL

## 2023-10-18 MED ORDER — SODIUM CHLORIDE 0.9 % IV SOLN
250.0000 mL | INTRAVENOUS | Status: DC | PRN
Start: 2023-10-18 — End: 2023-10-18

## 2023-10-18 MED ORDER — ASPIRIN 81 MG PO CHEW: 81.0000 mg | CHEWABLE_TABLET | ORAL | Status: DC

## 2023-10-18 MED ORDER — SODIUM CHLORIDE 0.9% FLUSH: 3.0000 mL | Freq: Two times a day (BID) | INTRAVENOUS | Status: DC

## 2023-10-18 MED ORDER — FENTANYL CITRATE (PF) 100 MCG/2ML IJ SOLN
INTRAMUSCULAR | Status: DC | PRN
  Administered 2023-10-18: 25 ug via INTRAVENOUS

## 2023-10-18 MED ORDER — FENTANYL CITRATE (PF) 100 MCG/2ML IJ SOLN
INTRAMUSCULAR | Status: AC
  Filled 2023-10-18: qty 2

## 2023-10-18 MED ORDER — SODIUM CHLORIDE 0.9 % WEIGHT BASED INFUSION: 3.0000 mL/kg/h | INTRAVENOUS | Status: AC

## 2023-10-18 MED ORDER — HEPARIN SODIUM (PORCINE) 1000 UNIT/ML IJ SOLN
INTRAMUSCULAR | Status: DC | PRN
  Administered 2023-10-18 (×2): 4000 [IU] via INTRAVENOUS

## 2023-10-18 MED ORDER — HYDRALAZINE HCL 20 MG/ML IJ SOLN: 10.0000 mg | INTRAMUSCULAR | Status: DC | PRN

## 2023-10-18 MED ORDER — LIDOCAINE HCL (PF) 1 % IJ SOLN
INTRAMUSCULAR | Status: AC
  Filled 2023-10-18: qty 30

## 2023-10-18 MED ORDER — LABETALOL HCL 5 MG/ML IV SOLN: 10.0000 mg | INTRAVENOUS | Status: DC | PRN

## 2023-10-18 MED ORDER — VERAPAMIL HCL 2.5 MG/ML IV SOLN
INTRAVENOUS | Status: AC
Start: 2023-10-18 — End: ?
  Filled 2023-10-18: qty 2

## 2023-10-18 MED ORDER — HEPARIN (PORCINE) IN NACL 1000-0.9 UT/500ML-% IV SOLN
INTRAVENOUS | Status: DC | PRN
  Administered 2023-10-18: 1000 mL via SURGICAL_CAVITY

## 2023-10-18 MED ORDER — LIDOCAINE HCL (PF) 1 % IJ SOLN
INTRAMUSCULAR | Status: DC | PRN
  Administered 2023-10-18: 2 mL

## 2023-10-18 MED ORDER — NITROGLYCERIN 1 MG/10 ML FOR IR/CATH LAB
INTRA_ARTERIAL | Status: DC
Start: 2023-10-18 — End: 2023-10-18
  Filled 2023-10-18: qty 10

## 2023-10-18 MED ORDER — MIDAZOLAM HCL 2 MG/2ML IJ SOLN
INTRAMUSCULAR | Status: DC | PRN
  Administered 2023-10-18: 1 mg via INTRAVENOUS

## 2023-10-18 SURGICAL SUPPLY — 9 items
CATH INFINITI AMBI 6FR TG (CATHETERS) IMPLANT
CATH LAUNCHER 6FR JR4 (CATHETERS) IMPLANT
DEVICE RAD COMP TR BAND LRG (VASCULAR PRODUCTS) IMPLANT
GLIDESHEATH SLEND SS 6F .021 (SHEATH) IMPLANT
GUIDEWIRE INQWIRE 1.5J.035X260 (WIRE) IMPLANT
GUIDEWIRE PRESSURE X 175 (WIRE) IMPLANT
KIT ESSENTIALS PG (KITS) IMPLANT
PACK CARDIAC CATHETERIZATION (CUSTOM PROCEDURE TRAY) ×1 IMPLANT
SET ATX-X65L (MISCELLANEOUS) IMPLANT

## 2023-10-18 NOTE — Discharge Instructions (Signed)

## 2023-10-18 NOTE — Interval H&P Note (Signed)
 History and Physical Interval Note:  10/18/2023 3:28 PM  Andre Martinez  has presented today for surgery, with the diagnosis of coronary artery disease with angina pectoris, abnormal stress test.  The various methods of treatment have been discussed with the patient and family. After consideration of risks, benefits and other options for treatment, the patient has consented to  Procedure(s): LEFT HEART CATH AND CORONARY ANGIOGRAPHY (N/A)  PERCUTANEOUS CORONARY INTERVENTION  as a surgical intervention.  The patient's history has been reviewed, patient examined, no change in status, stable for surgery.  I have reviewed the patient's chart and labs.  Questions were answered to the patient's satisfaction.    Cath Lab Visit (complete for each Cath Lab visit)  Clinical Evaluation Leading to the Procedure:   ACS: No.  Non-ACS:    Anginal Classification: CCS II  Anti-ischemic medical therapy: Minimal Therapy (1 class of medications)  Non-Invasive Test Results: High-risk stress test findings: cardiac mortality >3%/year  Prior CABG: No previous CABG     Randene Bustard

## 2023-10-19 ENCOUNTER — Encounter: Payer: Self-pay | Admitting: Cardiology

## 2023-10-19 ENCOUNTER — Encounter: Payer: Self-pay | Admitting: Family Medicine

## 2023-10-19 DIAGNOSIS — R1314 Dysphagia, pharyngoesophageal phase: Secondary | ICD-10-CM

## 2023-10-19 DIAGNOSIS — Z8719 Personal history of other diseases of the digestive system: Secondary | ICD-10-CM

## 2023-10-19 DIAGNOSIS — I10 Essential (primary) hypertension: Secondary | ICD-10-CM

## 2023-10-19 NOTE — Telephone Encounter (Signed)
 Discussion with patient indicate that Dr. Sabra Cramp was taking care of him in the past when he had difficulty with this and apparently he had evidence of a stricture which the patient says was dilated.  He also was noting some dysphagia and acid burning sensation.

## 2023-10-20 NOTE — Telephone Encounter (Signed)
 Recommend decreasing toprol  XL to 25 mg daily and starting amlodipine  2.5 mg daily.  Monitor BP/HR daily for next 2 weeks and let us  know results

## 2023-10-25 ENCOUNTER — Encounter: Payer: Self-pay | Admitting: Gastroenterology

## 2023-10-25 MED ORDER — METOPROLOL SUCCINATE ER 25 MG PO TB24: 25.0000 mg | ORAL_TABLET | Freq: Every day | ORAL | 3 refills | Status: AC

## 2023-10-25 MED ORDER — AMLODIPINE BESYLATE 2.5 MG PO TABS: 2.5000 mg | ORAL_TABLET | Freq: Every day | ORAL | 3 refills | Status: DC

## 2023-10-25 NOTE — Addendum Note (Signed)
 Addended by: Alano Blasco L on: 10/25/2023 09:05 AM   Modules accepted: Orders

## 2023-11-07 NOTE — Progress Notes (Unsigned)
 Cardiology Office Note:    Date:  11/08/2023   ID:  [name], DOB 15-Aug-1955, MRN [id number]  PCP:  Joyce Norleen BROCKS, MD  Cardiologist:  Lonni LITTIE Nanas, MD  Electrophysiologist:  None   Referring MD: Joyce Norleen BROCKS, MD   Chief Complaint  Patient presents with   Coronary Artery Disease    History of Present Illness:    Andre Martinez is a 68 y.o. male with a hx of CAD with NSTEMI and DES to RCA on 09/30/2021, hypertension, hyperlipidemia, PVCs, GERD who presents for follow-up.  He was hospitalized 09/2021 with chest pain, found to have NSTEMI.  LHC showed 50% stenosis of mid RCA followed by 90% stenosis of distal RCA.  Underwent successful PCI with DES to distal RCA.  Echocardiogram showed EF 60 to 65%.  He reported chest pain and underwent stress PET 09/2023, which was intermediate risk study showing mid to basal inferior ischemia.  LHC 10/18/2023 showed 50% proximal to mid RCA stenosis, 50% mid to distal RCA stenosis (reduced to 10% with intracoronary nitroglycerin ), patent distal RCA stent.  Given improvement with intracoronary nitroglycerin , suspected coronary vasospasm as etiology of symptoms and recommended starting amlodipine .  Since last clinic visit, he report that he is feeling improved.  He has not been having chest pain since his heart catheterization.  Reports some lightheadedness with standing but denies any syncope.  He continues to walk daily.  Denies any dyspnea, lower extremity edema, or palpitations.      Past Medical History:  Diagnosis Date   Allergy    Arthritis    Coronary artery disease    GERD (gastroesophageal reflux disease)    Hemorrhoids    Hyperlipidemia    Hypertension    Sinusitis 12/94 3/95    Past Surgical History:  Procedure Laterality Date   CARDIAC CATHETERIZATION     COLONOSCOPY  2006   DR.HAYES   CORONARY PRESSURE/FFR STUDY N/A 10/18/2023   Procedure: CORONARY PRESSURE/FFR STUDY;  Surgeon: Anner Alm ORN, MD;  Location: [facility]  INVASIVE CV LAB;  Service: Cardiovascular;  Laterality: N/A;   CORONARY STENT INTERVENTION N/A 09/30/2021   Procedure: CORONARY STENT INTERVENTION;  Surgeon: Dann Candyce RAMAN, MD;  Location: [facility] INVASIVE CV LAB;  Service: Cardiovascular;  Laterality: N/A;   LEFT HEART CATH AND CORONARY ANGIOGRAPHY N/A 09/30/2021   Procedure: LEFT HEART CATH AND CORONARY ANGIOGRAPHY;  Surgeon: Dann Candyce RAMAN, MD;  Location: [facility] INVASIVE CV LAB;  Service: Cardiovascular;  Laterality: N/A;   LEFT HEART CATH AND CORONARY ANGIOGRAPHY N/A 10/18/2023   Procedure: LEFT HEART CATH AND CORONARY ANGIOGRAPHY;  Surgeon: Anner Alm ORN, MD;  Location: [facility] INVASIVE CV LAB;  Service: Cardiovascular;  Laterality: N/A;   rectal fissure     REPAIR   SKIN BIOPSY Left 07/25/2018   see report in chat   SKIN BIOPSY Left 07/08/2021   dysplastic compound nevs with severe atypia margin close   SKIN BIOPSY Left 11/04/2021   no residual dysplastic nevu   TONSILLECTOMY     TOTAL KNEE ARTHROPLASTY Left 05/21/2018   Procedure: LEFT TOTAL KNEE ARTHROPLASTY;  Surgeon: Melodi Lerner, MD;  Location: [facility];  Service: Orthopedics;  Laterality: Left;    UPPER GI ENDOSCOPY  04/08/2013   normal esophagus,multiple gastric polyps,normal examined duodenum    Current Medications: Current Meds  Medication Sig   amLODipine  (NORVASC ) 2.5 MG tablet Take 1 tablet (2.5 mg total) by mouth daily.   aspirin  81 MG chewable tablet Chew 1 tablet (  81 mg total) by mouth daily.   atorvastatin  (LIPITOR ) 40 MG tablet Take 1 tablet (40 mg total) by mouth daily.   metoprolol  succinate (TOPROL -XL) 25 MG 24 hr tablet Take 1 tablet (25 mg total) by mouth daily. Take with or immediately following a meal.   Multiple Vitamin (MULTIVITAMIN) tablet Take 1 tablet by mouth in the morning. Centrum Silver   nitroGLYCERIN  (NITROSTAT ) 0.4 MG SL tablet Place 1 tablet (0.4 mg total) under the tongue every 5 (five) minutes x 3 doses as needed for chest pain. Call 911  if you need a fourth dose in a 24 period   pantoprazole  (PROTONIX ) 40 MG tablet Take 1 tablet (40 mg total) by mouth daily.   Polyethylene Glycol 400 (BLINK TEARS) 0.25 % SOLN Place 1-2 drops into both eyes 3 (three) times daily as needed (dry/irritated eyes.).     Allergies:   Codeine, Aciphex [rabeprazole], Penicillins, and Prevacid [lansoprazole]   Social History   Socioeconomic History   Marital status: Married    Spouse name: Not on file   Number of children: Not on file   Years of education: Not on file   Highest education level: Master's degree (e.g., MA, MS, MEng, MEd, MSW, MBA)  Occupational History   Not on file  Tobacco Use   Smoking status: Former    Types: Cigars   Smokeless tobacco: Never  Vaping Use   Vaping status: Never Used  Substance and Sexual Activity   Alcohol use: Yes    Alcohol/week: 3.0 standard drinks of alcohol    Types: 3 Cans of beer per week    Comment: occasionaly   Drug use: No   Sexual activity: Yes    Partners: Female    Comment: MARRIED  Other Topics Concern   Not on file  Social History Narrative   Not on file   Social Drivers of Health   Financial Resource Strain: Low Risk  (05/23/2023)   Overall Financial Resource Strain (CARDIA)    Difficulty of Paying Living Expenses: Not hard at all  Food Insecurity: No Food Insecurity (05/23/2023)   Hunger Vital Sign    Worried About Running Out of Food in the Last Year: Never true    Ran Out of Food in the Last Year: Never true  Transportation Needs: No Transportation Needs (05/23/2023)   PRAPARE - Administrator, Civil Service (Medical): No    Lack of Transportation (Non-Medical): No  Physical Activity: Sufficiently Active (05/23/2023)   Exercise Vital Sign    Days of Exercise per Week: 7 days    Minutes of Exercise per Session: 40 min  Stress: No Stress Concern Present (05/23/2023)   [employer] of Occupational Health - Occupational Stress Questionnaire    Feeling of Stress :  Only a little  Social Connections: Socially Integrated (05/23/2023)   Social Connection and Isolation Panel    Frequency of Communication with Friends and Family: Twice a week    Frequency of Social Gatherings with Friends and Family: Once a week    Attends Religious Services: 1 to 4 times per year    Active Member of [employer] or Organizations: Yes    Attends Banker Meetings: 1 to 4 times per year    Marital Status: Married     Family History: The patient's family history includes Diabetes in his mother; Heart disease in his father and paternal grandfather; Hypertension in his father and mother.  ROS:   Please see the history of  present illness.     All other systems reviewed and are negative.  EKGs/Labs/Other Studies Reviewed:    The following studies were reviewed today:  EKG:   01/06/2023: Sinus bradycardia, rate 53, nonspecific T wave flattening 07/06/2023: Normal sinus rhythm, rate 63, no ST abnormalities 10/12/2023: Sinus bradycardia, rate 57  Recent Labs: 05/23/2023: ALT 16 10/12/2023: BUN 17; Creatinine, Ser 0.89; Hemoglobin 15.6; Platelets 210; Potassium 5.0; Sodium 142  Recent Lipid Panel    Component Value Date/Time   CHOL 125 05/23/2023 1023   TRIG 66 05/23/2023 1023   HDL 52 05/23/2023 1023   CHOLHDL 2.4 05/23/2023 1023   CHOLHDL 3.1 10/01/2021 0152   VLDL 21 10/01/2021 0152   LDLCALC 59 05/23/2023 1023    Physical Exam:    VS:  BP 114/68   Pulse 72   Ht 6' 2 (1.88 m)   Wt 175 lb 6.4 oz (79.6 kg)   SpO2 99%   BMI 22.52 kg/m     Wt Readings from Last 3 Encounters:  11/08/23 175 lb 6.4 oz (79.6 kg)  10/18/23 175 lb (79.4 kg)  10/12/23 172 lb (78 kg)     GEN:  Well nourished, well developed in no acute distress HEENT: Normal NECK: No JVD; No carotid bruits LYMPHATICS: No lymphadenopathy CARDIAC: RRR, no murmurs, rubs, gallops RESPIRATORY:  Clear to auscultation without rales, wheezing or rhonchi  ABDOMEN: Soft, non-tender,  non-distended MUSCULOSKELETAL:  No edema; No deformity  SKIN: Warm and dry NEUROLOGIC:  Alert and oriented x 3 PSYCHIATRIC:  Normal affect   ASSESSMENT:    1. Coronary artery disease involving native coronary artery of native heart without angina pectoris   2. Coronary vasospasm (HCC)   3. PVCs (premature ventricular contractions)   4. Primary hypertension   5. Hyperlipidemia, unspecified hyperlipidemia type      PLAN:    CAD: hospitalized 09/2021 with chest pain, found to have NSTEMI.  LHC showed 50% stenosis of mid RCA followed by 90% stenosis of distal RCA.  Underwent successful PCI with DES to distal RCA.  Echocardiogram showed EF 60 to 65%.  He reported atypical chest pain and underwent stress PET 09/2023, which was intermediate risk study showing mid to basal inferior ischemia. LHC 10/18/2023 showed 50% proximal to mid RCA stenosis, 50% mid to distal RCA stenosis (reduced to 10% with 2 coronary nitroglycerin ), patent distal RCA stent.  Given improvement with intracoronary nitroglycerin , suspected coronary vasospasm as etiology of symptoms and recommended starting amlodipine . -Continue aspirin  81 mg daily.   -Continue Toprol -XL 25 mg daily and amlodipine  2.5 mg daily -Continue atorvastatin  40 mg daily  PVCs: Noted to have frequent PVCs during admission 09/2021 as well as NSVT.  Zio patch x 14 days 09/2021 showed 1 episode of NSVT lasting 6 beats, rare PVCs. -Continue Toprol -XL 25 mg daily  Hypertension: Continue Toprol -XL, amlodipine   Hyperlipidemia: Continue atorvastatin  40 mg daily, LDL 59 on 05/23/2023  RTC in 6 months  Medication Adjustments/Labs and Tests Ordered: Current medicines are reviewed at length with the patient today.  Concerns regarding medicines are outlined above.  No orders of the defined types were placed in this encounter.  No orders of the defined types were placed in this encounter.   Patient Instructions  Medication Instructions:  Continue current  medications *If you need a refill on your cardiac medications before your next appointment, please call your pharmacy*  Lab Work: none If you have labs (blood work) drawn today and your tests are completely normal, you will  receive your results only by: MyChart Message (if you have MyChart) OR A paper copy in the mail If you have any lab test that is abnormal or we need to change your treatment, we will call you to review the results.  Testing/Procedures: none  Follow-Up: At [facility], you and your health needs are our priority.  As part of our continuing mission to provide you with exceptional heart care, our providers are all part of one team.  This team includes your primary Cardiologist (physician) and Advanced Practice Providers or APPs (Physician Assistants and Nurse Practitioners) who all work together to provide you with the care you need, when you need it.  Your next appointment:   6 month(s)  Provider:   Lonni LITTIE Nanas, MD    We recommend signing up for the patient portal called MyChart.  Sign up information is provided on this After Visit Summary.  MyChart is used to connect with patients for Virtual Visits (Telemedicine).  Patients are able to view lab/test results, encounter notes, upcoming appointments, etc.  Non-urgent messages can be sent to your provider as well.   To learn more about what you can do with MyChart, go to [email or web address].   Other Instructions NONE       Signed, Lonni LITTIE Nanas, MD  11/08/2023 11:26 AM    [facility]

## 2023-11-08 ENCOUNTER — Encounter: Payer: Self-pay | Admitting: Cardiology

## 2023-11-08 ENCOUNTER — Ambulatory Visit: Attending: Cardiology | Admitting: Cardiology

## 2023-11-08 VITALS — BP 114/68 | HR 72 | Ht 74.0 in | Wt 175.4 lb

## 2023-11-08 DIAGNOSIS — I10 Essential (primary) hypertension: Secondary | ICD-10-CM

## 2023-11-08 DIAGNOSIS — I2510 Atherosclerotic heart disease of native coronary artery without angina pectoris: Secondary | ICD-10-CM | POA: Diagnosis not present

## 2023-11-08 DIAGNOSIS — E785 Hyperlipidemia, unspecified: Secondary | ICD-10-CM

## 2023-11-08 DIAGNOSIS — I493 Ventricular premature depolarization: Secondary | ICD-10-CM

## 2023-11-08 DIAGNOSIS — I201 Angina pectoris with documented spasm: Secondary | ICD-10-CM

## 2023-11-08 NOTE — Patient Instructions (Signed)
 Medication Instructions:  Continue current medications *If you need a refill on your cardiac medications before your next appointment, please call your pharmacy*  Lab Work: none If you have labs (blood work) drawn today and your tests are completely normal, you will receive your results only by: MyChart Message (if you have MyChart) OR A paper copy in the mail If you have any lab test that is abnormal or we need to change your treatment, we will call you to review the results.  Testing/Procedures: none  Follow-Up: At [facility], you and your health needs are our priority.  As part of our continuing mission to provide you with exceptional heart care, our providers are all part of one team.  This team includes your primary Cardiologist (physician) and Advanced Practice Providers or APPs (Physician Assistants and Nurse Practitioners) who all work together to provide you with the care you need, when you need it.  Your next appointment:   6 month(s)  Provider:   Lonni LITTIE Nanas, MD    We recommend signing up for the patient portal called MyChart.  Sign up information is provided on this After Visit Summary.  MyChart is used to connect with patients for Virtual Visits (Telemedicine).  Patients are able to view lab/test results, encounter notes, upcoming appointments, etc.  Non-urgent messages can be sent to your provider as well.   To learn more about what you can do with MyChart, go to [email or web address].   Other Instructions NONE

## 2023-12-04 ENCOUNTER — Telehealth: Payer: Self-pay

## 2023-12-04 NOTE — Progress Notes (Signed)
   12/04/2023  Patient ID: Andre Martinez, male   DOB: 21-Jul-1955, 68 y.o.   MRN: [id number]  Pharmacy Quality Measure Review  This patient is appearing on a report for being at risk of failing the adherence measure for cholesterol (statin) medications this calendar year.   Medication: Atorvastatin  40mg  Last fill date: 06/23/23 for 100 day supply  Left voicemail for patient to return my call at their convenience.  Jon VEAR Lindau, PharmD Clinical Pharmacist [phone]

## 2024-01-05 ENCOUNTER — Ambulatory Visit: Admitting: Gastroenterology

## 2024-03-12 ENCOUNTER — Encounter: Payer: Self-pay | Admitting: Gastroenterology

## 2024-03-12 ENCOUNTER — Ambulatory Visit: Admitting: Gastroenterology

## 2024-03-12 DIAGNOSIS — K219 Gastro-esophageal reflux disease without esophagitis: Secondary | ICD-10-CM | POA: Diagnosis not present

## 2024-03-12 DIAGNOSIS — R1310 Dysphagia, unspecified: Secondary | ICD-10-CM | POA: Diagnosis not present

## 2024-03-12 NOTE — Patient Instructions (Signed)
 _______________________________________________________  If your blood pressure at your visit was 140/90 or greater, please contact your primary care physician to follow up on this.  _______________________________________________________  If you are age 70 or older, your body mass index should be between 23-30. Your Body mass index is 23.37 kg/m. If this is out of the aforementioned range listed, please consider follow up with your Primary Care Provider.  If you are age 20 or younger, your body mass index should be between 19-25. Your Body mass index is 23.37 kg/m. If this is out of the aformentioned range listed, please consider follow up with your Primary Care Provider.   ________________________________________________________  The [facility] providers would like to encourage you to use MYCHART to communicate with providers for non-urgent requests or questions.  Due to long hold times on the telephone, sending your provider a message by [facility] may be a faster and more efficient way to get a response.  Please allow 48 business hours for a response.  Please remember that this is for non-urgent requests.  _______________________________________________________  [facility] is using a team-based approach to care.  Your team is made up of your doctor and two to three APPS. Our APPS (Nurse Practitioners and Physician Assistants) work with your physician to ensure care continuity for you. They are fully qualified to address your health concerns and develop a treatment plan. They communicate directly with your gastroenterologist to care for you. Seeing the Advanced Practice Practitioners on your physician's team can help you by facilitating care more promptly, often allowing for earlier appointments, access to diagnostic testing, procedures, and other specialty referrals.   You have been scheduled for an esophageal manometry test at [facility] on 06-26-24 at 10:30am. Please  arrive 30 minutes prior to your procedure for registration. You will need to go to outpatient registration (1st floor of the hospital) first. Make certain to bring your insurance cards as well as a complete list of medications.  Please remember the following:  1) Do not take any muscle relaxants, xanax (alprazolam) or ativan for 1 day prior to your test as well as the day of the test.  2) Nothing to eat or drink after 12:00 midnight on the night before your test.  3) Hold all diabetic medications/insulin the morning of the test. You may eat and take your medications after the test.  It will take at least 2 weeks to receive the results of this test from your physician.  ------------------------------------------ ABOUT ESOPHAGEAL MANOMETRY Esophageal manometry (muh-NOM-uh-tree) is a test that gauges how well your esophagus works. Your esophagus is the long, muscular tube that connects your throat to your stomach. Esophageal manometry measures the rhythmic muscle contractions (peristalsis) that occur in your esophagus when you swallow. Esophageal manometry also measures the coordination and force exerted by the muscles of your esophagus.  During esophageal manometry, a thin, flexible tube (catheter) that contains sensors is passed through your nose, down your esophagus and into your stomach. Esophageal manometry can be helpful in diagnosing some mostly uncommon disorders that affect your esophagus.   Why it's done  Esophageal manometry is used to evaluate the movement (motility) of food through the esophagus and into the stomach. The test measures how well the circular bands of muscle (sphincters) at the top and bottom of your esophagus open and close, as well as the pressure, strength and pattern of the wave of esophageal muscle contractions that moves food along.  What you can expect  Esophageal manometry is an  outpatient procedure done without sedation. Most people tolerate it well. You may be  asked to change into a hospital gown before the test starts.   During esophageal manometry   While you are sitting up, a member of your health care team sprays your throat with a numbing medication or puts numbing gel in your nose or both.  A catheter is guided through your nose into your esophagus. The catheter may be sheathed in a water-filled sleeve. It doesn't interfere with your breathing. However, your eyes may water, and you may gag. You may have a slight nosebleed from irritation.  After the catheter is in place, you may be asked to lie on your back on an exam table, or you may be asked to remain seated.  You then swallow small sips of water. As you do, a computer connected to the catheter records the pressure, strength and pattern of your esophageal muscle contractions.  During the test, you'll be asked to breathe slowly and smoothly, remain as still as possible, and swallow only when you're asked to do so.  A member of your health care team may move the catheter down into your stomach while the catheter continues its measurements.  The catheter then is slowly withdrawn.  The test usually lasts 20 to 30 minutes.   After esophageal manometry   When your esophageal manometry is complete, you may return to your normal activities  This test typically takes 30-45 minutes to complete. ________________________________________________________________________________  Due to recent changes in healthcare laws, you may see the results of your imaging and laboratory studies on MyChart before your provider has had a chance to review them.  We understand that in some cases there may be results that are confusing or concerning to you. Not all laboratory results come back in the same time frame and the provider may be waiting for multiple results in order to interpret others.  Please give us  48 hours in order for your provider to thoroughly review all the results before contacting the office for  clarification of your results.   It was a pleasure to see you today!  Vito Cirigliano, D.O.

## 2024-03-12 NOTE — Progress Notes (Signed)
 Chief Complaint: Dysphagia   Referring Provider:     Joyce Norleen BROCKS, MD   HPI:     Andre Martinez is a 68 y.o. male with a history CAD with NSTEMI and DES to RCA on 09/30/2021, hypertension, hyperlipidemia, PVCs, GERD of referred to the Gastroenterology Clinic for evaluation of dysphagia.  Reports solid food dysphagia for the last 10 years.  Previously followed with [facility] and was told he had esophageal spasm.  Barium esophagram notable for dysmotility and short segment lower esophageal stricture in 04/2021 as below.  Subsequent EGD in 05/2021 without stricture and empirically dilated 20 mm TTS balloon without mucosal disruption.  No improvement with esophageal dilation.  Has had several EGDs over the years, but no prior Esophageal Manometry. Sxs are intermittent, but can occur with water alone. Worse with rice, eating quickly. Sxs near daily. Sxs last a couple minutes and clears and otherwise able to finish his meal.  Weight is stable. No food regurgitaiton, food impactions. No pain, odynophagia. Described as frustrating.  Points to his anterior neck and suprasternal notch as site of perceived symptom origin.   History of GERD with index sxs of heartburn and regurgitation. Sxs well-controlled with Protonix  40 mg daily, which he has taken for years.    -05/05/2021: Barium esophagram: Short segment stricture in the distal esophagus through which 13 mm barium tablet would not pass.  Poor esophageal motility with stasis of contrast in the esophagus. - 06/02/2021: EGD (Dr. Donnald): Normal esophagus empirically dilated with 20 mm TTS balloon without mucosal disruption, benign fundic gland polyps, bilious fluid found in the gastric body without gastritis, normal duodenum. - 06/02/2021: Colonoscopy (Dr. Donnald): Normal.  Repeat in 10 years   Reviewed most recent Cardiology notes from 11/08/2023.  EF 60-65% on most recent TTE.  Stress PET 09/2023 which was intermediate risk study  showing mid to basal inferior ischemia. LHC 10/18/2023 showed 50% proximal to mid RCA stenosis, 50% mid to distal RCA stenosis (reduced to 10% with intracoronary nitroglycerin ), patent distal RCA stent.  Given improvement with intracoronary nitroglycerin , suspected coronary vasospasm as etiology of symptoms and recommended starting amlodipine .  No CP since heart catheterization.  Plan for continued medical management.  Mother with similar sxs without clear dx. otherwise no known family history of GI malignancy.      Latest Ref Rng & Units 10/12/2023   10:29 AM 05/23/2023   10:23 AM 10/01/2021    1:52 AM  CBC  WBC 3.4 - 10.8 x10E3/uL 8.7  8.2  12.4   Hemoglobin 13.0 - 17.7 g/dL 84.3  84.1  86.6   Hematocrit 37.5 - 51.0 % 47.8  47.5  38.7   Platelets 150 - 450 x10E3/uL 210  215  253       Latest Ref Rng & Units 10/12/2023   10:29 AM 05/23/2023   10:23 AM 06/28/2022   12:10 PM  BMP  Glucose 70 - 99 mg/dL 82  82  85   BUN 8 - 27 mg/dL 17  19  18    Creatinine 0.76 - 1.27 mg/dL 9.10  9.03  9.18   BUN/Creat Ratio 10 - 24 19  20  22    Sodium 134 - 144 mmol/L 142  142  140   Potassium 3.5 - 5.2 mmol/L 5.0  4.9  4.9   Chloride 96 - 106 mmol/L 103  103  103   CO2 20 - 29 mmol/L  23  24  24    Calcium  8.6 - 10.2 mg/dL 9.4  9.3  9.4       Past Medical History:  Diagnosis Date   Allergy    Arthritis    Coronary artery disease    GERD (gastroesophageal reflux disease)    Hemorrhoids    Hyperlipidemia    Hypertension    Sinusitis 12/94 3/95     Past Surgical History:  Procedure Laterality Date   CARDIAC CATHETERIZATION     COLONOSCOPY  2006   DR.HAYES   CORONARY PRESSURE/FFR STUDY N/A 10/18/2023   Procedure: CORONARY PRESSURE/FFR STUDY;  Surgeon: Anner Alm ORN, MD;  Location: [facility] INVASIVE CV LAB;  Service: Cardiovascular;  Laterality: N/A;   CORONARY STENT INTERVENTION N/A 09/30/2021   Procedure: CORONARY STENT INTERVENTION;  Surgeon: Dann Candyce RAMAN, MD;  Location: [facility] INVASIVE CV  LAB;  Service: Cardiovascular;  Laterality: N/A;   LEFT HEART CATH AND CORONARY ANGIOGRAPHY N/A 09/30/2021   Procedure: LEFT HEART CATH AND CORONARY ANGIOGRAPHY;  Surgeon: Dann Candyce RAMAN, MD;  Location: [facility] INVASIVE CV LAB;  Service: Cardiovascular;  Laterality: N/A;   LEFT HEART CATH AND CORONARY ANGIOGRAPHY N/A 10/18/2023   Procedure: LEFT HEART CATH AND CORONARY ANGIOGRAPHY;  Surgeon: Anner Alm ORN, MD;  Location: [facility] INVASIVE CV LAB;  Service: Cardiovascular;  Laterality: N/A;   rectal fissure     REPAIR   SKIN BIOPSY Left 07/25/2018   see report in chat   SKIN BIOPSY Left 07/08/2021   dysplastic compound nevs with severe atypia margin close   SKIN BIOPSY Left 11/04/2021   no residual dysplastic nevu   TONSILLECTOMY     TOTAL KNEE ARTHROPLASTY Left 05/21/2018   Procedure: LEFT TOTAL KNEE ARTHROPLASTY;  Surgeon: Melodi Lerner, MD;  Location: [facility] OR[facility];  Service: Orthopedics;  Laterality: Left;    UPPER GI ENDOSCOPY  04/08/2013   normal esophagus,multiple gastric polyps,normal examined duodenum   Family History  Problem Relation Age of Onset   Hypertension Mother    Diabetes Mother    Heart disease Father    Hypertension Father    Heart disease Paternal Grandfather    Social History   Tobacco Use   Smoking status: Former    Types: Cigars   Smokeless tobacco: Never  Vaping Use   Vaping status: Never Used  Substance Use Topics   Alcohol use: Yes    Alcohol/week: 3.0 standard drinks of alcohol    Types: 3 Cans of beer per week    Comment: occasionaly   Drug use: No   Current Outpatient Medications  Medication Sig Dispense Refill   amLODipine  (NORVASC ) 2.5 MG tablet Take 1 tablet (2.5 mg total) by mouth daily. 90 tablet 3   aspirin  81 MG chewable tablet Chew 1 tablet (81 mg total) by mouth daily.     atorvastatin  (LIPITOR ) 40 MG tablet Take 1 tablet (40 mg total) by mouth daily. 100 tablet 3   metoprolol  succinate (TOPROL -XL) 25 MG 24 hr tablet Take 1 tablet (25  mg total) by mouth daily. Take with or immediately following a meal. 90 tablet 3   Multiple Vitamin (MULTIVITAMIN) tablet Take 1 tablet by mouth in the morning. Centrum Silver     nitroGLYCERIN  (NITROSTAT ) 0.4 MG SL tablet Place 1 tablet (0.4 mg total) under the tongue every 5 (five) minutes x 3 doses as needed for chest pain. Call 911 if you need a fourth dose in a 24 period 25 tablet 12   pantoprazole  (PROTONIX ) 40  MG tablet Take 1 tablet (40 mg total) by mouth daily. 100 tablet 2   Polyethylene Glycol 400 (BLINK TEARS) 0.25 % SOLN Place 1-2 drops into both eyes 3 (three) times daily as needed (dry/irritated eyes.).     No current facility-administered medications for this visit.   Allergies  Allergen Reactions   Codeine Nausea Only   Aciphex [Rabeprazole] Palpitations    headaches   Penicillins Rash        Prevacid [Lansoprazole] Palpitations    headaches     Review of Systems: All systems reviewed and negative except where noted in HPI.     Physical Exam:    Wt Readings from Last 3 Encounters:  03/12/24 182 lb (82.6 kg)  11/08/23 175 lb 6.4 oz (79.6 kg)  10/18/23 175 lb (79.4 kg)    BP 110/60   Pulse 74   Ht 6' 2 (1.88 m)   Wt 182 lb (82.6 kg)   BMI 23.37 kg/m  Constitutional:  Pleasant, in no acute distress. Psychiatric: Normal mood and affect. Behavior is normal. EENT: Pupils normal.  Conjunctivae are normal. No scleral icterus. Neck supple. No cervical LAD. Cardiovascular: Normal rate, regular rhythm. No edema Pulmonary/chest: Effort normal and breath sounds normal. No wheezing, rales or rhonchi. Abdominal: Soft, nondistended, nontender. Bowel sounds active throughout. There are no masses palpable. No hepatomegaly. Neurological: Alert and oriented to person place and time. Skin: Skin is warm and dry. No rashes noted.   ASSESSMENT AND PLAN;   1) Dysphagia Longstanding history of intermittent dysphagia and spasm type feeling without frank pain or  odynophagia.  Underwent EGD with empiric dilation in 2023 without improvement.  Esophagram with dysmotility.  I personally reviewed his prior outside endoscopy reports and the previous esophagram and discussed those findings with the patient and family member today. - Refer for Esophageal Manometry - Advised patient to cut food into small pieces, eat small bites, chew food thoroughly and with plenty of liquids to avoid food impaction.  2) GERD Reflux symptoms well-controlled on current therapy. - Continue Protonix  - Continue antireflux lifestyle/dietary modifications     Sandor LULLA Flatter, DO, FACG  03/12/2024, 8:34 AM   Joyce Norleen BROCKS, MD

## 2024-03-12 NOTE — Addendum Note (Signed)
 Addended by: BENJAMINE NAT DEL on: 03/12/2024 02:46 PM   Modules accepted: Orders

## 2024-04-30 ENCOUNTER — Ambulatory Visit

## 2024-04-30 VITALS — Ht 74.0 in | Wt 177.0 lb

## 2024-04-30 DIAGNOSIS — Z0000 Encounter for general adult medical examination without abnormal findings: Secondary | ICD-10-CM

## 2024-04-30 NOTE — Patient Instructions (Signed)
 Andre Martinez,  Thank you for taking the time for your Medicare Wellness Visit. I appreciate your continued commitment to your health goals. Please review the care plan we discussed, and feel free to reach out if I can assist you further.  Please note that Annual Wellness Visits do not include a physical exam. Some assessments may be limited, especially if the visit was conducted virtually. If needed, we may recommend an in-person follow-up with your provider.  Ongoing Care Seeing your primary care provider every 3 to 6 months helps us  monitor your health and provide consistent, personalized care.   Referrals If a referral was made during today's visit and you haven't received any updates within two weeks, please contact the referred provider directly to check on the status.  Recommended Screenings:  Health Maintenance  Topic Date Due   COVID-19 Vaccine (7 - 2025-26 season) 01/15/2024   Medicare Annual Wellness Visit  04/24/2024   Colon Cancer Screening  06/01/2031   DTaP/Tdap/Td vaccine (4 - Td or Tdap) 05/27/2032   Pneumococcal Vaccine for age over 93  Completed   Flu Shot  Completed   Hepatitis C Screening  Completed   Zoster (Shingles) Vaccine  Completed   Meningitis B Vaccine  Aged Out       04/26/2024   10:40 AM  Advanced Directives  Does Patient Have a Medical Advance Directive? Yes  Type of Estate Agent of [place];Living will  Does patient want to make changes to medical advance directive? No - Patient declined  Copy of Healthcare Power of Attorney in Chart? No - copy requested    Vision: Annual vision screenings are recommended for early detection of glaucoma, cataracts, and diabetic retinopathy. These exams can also reveal signs of chronic conditions such as diabetes and high blood pressure.  Dental: Annual dental screenings help detect early signs of oral cancer, gum disease, and other conditions linked to overall health, including heart disease  and diabetes.  Please see the attached documents for additional preventive care recommendations.

## 2024-04-30 NOTE — Progress Notes (Signed)
 Chief Complaint  Patient presents with   Medicare Wellness     Subjective:   Andre Martinez is a 68 y.o. male who presents for a Medicare Annual Wellness Visit.  Visit info / Clinical Intake: Medicare Wellness Visit Type:: Subsequent Annual Wellness Visit Persons participating in visit and providing information:: patient Medicare Wellness Visit Mode:: Video Since this visit was completed virtually, some vitals may be partially provided or unavailable. Missing vitals are due to the limitations of the virtual format.: Documented vitals are patient reported If Telephone or Video please confirm:: I connected with patient using audio/video enable telemedicine. I verified patient identity with two identifiers, discussed telehealth limitations, and patient agreed to proceed. Patient Location:: home Provider Location:: office Interpreter Needed?: No Pre-visit prep was completed: yes AWV questionnaire completed by patient prior to visit?: yes Date:: 04/26/24 Living arrangements:: (Patient-Rptd) lives with spouse/significant other Patient's Overall Health Status Rating: (Patient-Rptd) good Typical amount of pain: (Patient-Rptd) some Does pain affect daily life?: (Patient-Rptd) no Are you currently prescribed opioids?: no  Dietary Habits and Nutritional Risks How many meals a day?: (Patient-Rptd) 3 Eats fruit and vegetables daily?: (Patient-Rptd) yes Most meals are obtained by: (Patient-Rptd) preparing own meals In the last 2 weeks, have you had any of the following?: none Diabetic:: no  Functional Status Activities of Daily Living (to include ambulation/medication): (Patient-Rptd) Independent Ambulation: (Patient-Rptd) Independent Medication Administration: (Patient-Rptd) Independent Home Management (perform basic housework or laundry): (Patient-Rptd) Independent Manage your own finances?: (Patient-Rptd) yes Primary transportation is: (Patient-Rptd) driving Concerns about vision?:  (!) yes (has cataracts) Concerns about hearing?: no  Fall Screening Falls in the past year?: (Patient-Rptd) 0 Number of falls in past year: 0 Was there an injury with Fall?: 0 Fall Risk Category Calculator: 0 Patient Fall Risk Level: Low Fall Risk  Fall Risk Patient at Risk for Falls Due to: Medication side effect Fall risk Follow up: Falls prevention discussed; Falls evaluation completed  Home and Transportation Safety: All rugs have non-skid backing?: (Patient-Rptd) yes All stairs or steps have railings?: (Patient-Rptd) N/A, no stairs Grab bars in the bathtub or shower?: (Patient-Rptd) yes Have non-skid surface in bathtub or shower?: (Patient-Rptd) yes Good home lighting?: (Patient-Rptd) yes Regular seat belt use?: (Patient-Rptd) yes Hospital stays in the last year:: (Patient-Rptd) no  Cognitive Assessment Difficulty concentrating, remembering, or making decisions? : (Patient-Rptd) no Will 6CIT or Mini Cog be Completed: yes What year is it?: 0 points What month is it?: 0 points Give patient an address phrase to remember (5 components): [address] About what time is it?: 0 points Count backwards from 20 to 1: 0 points Say the months of the year in reverse: 0 points Repeat the address phrase from earlier: 0 points 6 CIT Score: 0 points  Advance Directives (For Healthcare) Does Patient Have a Medical Advance Directive?: Yes Does patient want to make changes to medical advance directive?: No - Patient declined Type of Advance Directive: Healthcare Power of [place]; Living will Copy of Healthcare Power of Attorney in Chart?: No - copy requested Copy of Living Will in Chart?: No - copy requested  Reviewed/Updated  Reviewed/Updated: Reviewed All (Medical, Surgical, Family, Medications, Allergies, Care Teams, Patient Goals)    Allergies (verified) Codeine, Aciphex [rabeprazole], Penicillins, and Prevacid [lansoprazole]   Current Medications  (verified) Outpatient Encounter Medications as of 04/30/2024  Medication Sig   amLODipine  (NORVASC ) 2.5 MG tablet Take 1 tablet (2.5 mg total) by mouth daily.   aspirin  81 MG chewable tablet Chew  1 tablet (81 mg total) by mouth daily.   atorvastatin  (LIPITOR ) 40 MG tablet Take 1 tablet (40 mg total) by mouth daily.   metoprolol  succinate (TOPROL -XL) 25 MG 24 hr tablet Take 1 tablet (25 mg total) by mouth daily. Take with or immediately following a meal.   Multiple Vitamin (MULTIVITAMIN) tablet Take 1 tablet by mouth in the morning. Centrum Silver   nitroGLYCERIN  (NITROSTAT ) 0.4 MG SL tablet Place 1 tablet (0.4 mg total) under the tongue every 5 (five) minutes x 3 doses as needed for chest pain. Call 911 if you need a fourth dose in a 24 period   pantoprazole  (PROTONIX ) 40 MG tablet Take 1 tablet (40 mg total) by mouth daily.   vitamin B-12 (CYANOCOBALAMIN) 500 MCG tablet Take 500 mcg by mouth every other day.   Polyethylene Glycol 400 (BLINK TEARS) 0.25 % SOLN Place 1-2 drops into both eyes 3 (three) times daily as needed (dry/irritated eyes.). (Patient not taking: Reported on 04/30/2024)   No facility-administered encounter medications on file as of 04/30/2024.    History: Past Medical History:  Diagnosis Date   Allergy    Arthritis    Coronary artery disease    GERD (gastroesophageal reflux disease)    Hemorrhoids    Hyperlipidemia    Hypertension    Sinusitis 12/94 3/95   Past Surgical History:  Procedure Laterality Date   CARDIAC CATHETERIZATION     COLONOSCOPY  2006   DR.HAYES   CORONARY PRESSURE/FFR STUDY N/A 10/18/2023   Procedure: CORONARY PRESSURE/FFR STUDY;  Surgeon: Anner Alm ORN, MD;  Location: [facility] INVASIVE CV LAB;  Service: Cardiovascular;  Laterality: N/A;   CORONARY STENT INTERVENTION N/A 09/30/2021   Procedure: CORONARY STENT INTERVENTION;  Surgeon: Dann Candyce RAMAN, MD;  Location: [facility] INVASIVE CV LAB;  Service: Cardiovascular;  Laterality: N/A;   LEFT HEART  CATH AND CORONARY ANGIOGRAPHY N/A 09/30/2021   Procedure: LEFT HEART CATH AND CORONARY ANGIOGRAPHY;  Surgeon: Dann Candyce RAMAN, MD;  Location: [facility] INVASIVE CV LAB;  Service: Cardiovascular;  Laterality: N/A;   LEFT HEART CATH AND CORONARY ANGIOGRAPHY N/A 10/18/2023   Procedure: LEFT HEART CATH AND CORONARY ANGIOGRAPHY;  Surgeon: Anner Alm ORN, MD;  Location: [facility] INVASIVE CV LAB;  Service: Cardiovascular;  Laterality: N/A;   rectal fissure     REPAIR   SKIN BIOPSY Left 07/25/2018   see report in chat   SKIN BIOPSY Left 07/08/2021   dysplastic compound nevs with severe atypia margin close   SKIN BIOPSY Left 11/04/2021   no residual dysplastic nevu   TONSILLECTOMY     TOTAL KNEE ARTHROPLASTY Left 05/21/2018   Procedure: LEFT TOTAL KNEE ARTHROPLASTY;  Surgeon: Melodi Lerner, MD;  Location: [facility];  Service: Orthopedics;  Laterality: Left;    UPPER GI ENDOSCOPY  04/08/2013   normal esophagus,multiple gastric polyps,normal examined duodenum   Family History  Problem Relation Age of Onset   Hypertension Mother    Diabetes Mother    Heart disease Father    Hypertension Father    Heart disease Paternal Grandfather    Social History   Occupational History   Not on file  Tobacco Use   Smoking status: Former    Types: Cigars   Smokeless tobacco: Never  Vaping Use   Vaping status: Never Used  Substance and Sexual Activity   Alcohol use: Yes    Alcohol/week: 3.0 standard drinks of alcohol    Types: 3 Cans of beer per week    Comment: occasionaly  Drug use: No   Sexual activity: Yes    Partners: Female    Comment: MARRIED   Tobacco Counseling Counseling given: Not Answered  SDOH Screenings   Food Insecurity: No Food Insecurity (04/26/2024)  Housing: Low Risk (04/26/2024)  Transportation Needs: No Transportation Needs (04/26/2024)  Utilities: Not At Risk (04/30/2024)  Alcohol Screen: Low Risk (04/26/2024)  Depression (PHQ2-9): Low Risk (04/30/2024)  Financial  Resource Strain: Low Risk (04/26/2024)  Physical Activity: Sufficiently Active (04/26/2024)  Social Connections: Socially Integrated (04/26/2024)  Stress: No Stress Concern Present (04/26/2024)  Tobacco Use: Medium Risk (04/30/2024)  Health Literacy: Adequate Health Literacy (04/30/2024)   See flowsheets for full screening details  Depression Screen PHQ 2 & 9 Depression Scale- Over the past 2 weeks, how often have you been bothered by any of the following problems? Little interest or pleasure in doing things: 0 Feeling down, depressed, or hopeless (PHQ Adolescent also includes...irritable): 0 PHQ-2 Total Score: 0 Trouble falling or staying asleep, or sleeping too much: 0 Feeling tired or having little energy: 0 Poor appetite or overeating (PHQ Adolescent also includes...weight loss): 0 Feeling bad about yourself - or that you are a failure or have let yourself or your family down: 0 Trouble concentrating on things, such as reading the newspaper or watching television (PHQ Adolescent also includes...like school work): 0 Moving or speaking so slowly that other people could have noticed. Or the opposite - being so fidgety or restless that you have been moving around a lot more than usual: 0 Thoughts that you would be better off dead, or of hurting yourself in some way: 0 PHQ-9 Total Score: 0 If you checked off any problems, how difficult have these problems made it for you to do your work, take care of things at home, or get along with other people?: Not difficult at all  Depression Treatment Depression Interventions/Treatment : EYV7-0 Score <4 Follow-up Not Indicated     Goals Addressed             This Visit's Progress    Patient Stated       04/30/2024, get swallowing under control             Objective:    Today's Vitals   04/30/24 1602  Weight: 177 lb (80.3 kg)  Height: 6' 2 (1.88 m)   Body mass index is 22.73 kg/m.  Hearing/Vision screen Hearing Screening -  Comments:: Denies hearing issues Vision Screening - Comments:: Regular eye exams, [facility] Immunizations and Health Maintenance Health Maintenance  Topic Date Due   COVID-19 Vaccine (7 - 2025-26 season) 01/15/2024   Medicare Annual Wellness (AWV)  04/30/2025   Colonoscopy  06/01/2031   DTaP/Tdap/Td (4 - Td or Tdap) 05/27/2032   Pneumococcal Vaccine: 50+ Years  Completed   Influenza Vaccine  Completed   Hepatitis C Screening  Completed   Zoster Vaccines- Shingrix   Completed   Meningococcal B Vaccine  Aged Out        Assessment/Plan:  This is a routine wellness examination for [place].  Patient Care Team: Joyce Norleen BROCKS, MD as PCP - General (Family Medicine) Kate Lonni CROME, MD as PCP - Cardiology (Cardiology) Cleotilde Sewer, OD (Optometry) [place] GAILS, DO as Consulting Physician (Gastroenterology)  I have personally reviewed and noted the following in the patients chart:   Medical and social history Use of alcohol, tobacco or illicit drugs  Current medications and supplements including opioid prescriptions. Functional ability and status Nutritional status Physical activity Advanced directives  List of other physicians Hospitalizations, surgeries, and ER visits in previous 12 months Vitals Screenings to include cognitive, depression, and falls Referrals and appointments  No orders of the defined types were placed in this encounter.  In addition, I have reviewed and discussed with patient certain preventive protocols, quality metrics, and best practice recommendations. A written personalized care plan for preventive services as well as general preventive health recommendations were provided to patient.   Ardella FORBES Dawn, LPN   [id number]/83/[id number]   Return in 1 year (on 04/30/2025).  After Visit Summary: (MyChart) Due to this being a telephonic visit, the after visit summary with patients personalized plan was offered to patient via MyChart   Nurse Notes:  Vaccines not given: Will obtain covid at pharmacy

## 2024-05-02 NOTE — Progress Notes (Unsigned)
 Cardiology Office Note:    Date:  05/02/2024   ID:  Andre Martinez, DOB 1955-05-29, MRN [id number]  PCP:  Andre Norleen BROCKS, MD  Cardiologist:  Andre LITTIE Nanas, MD  Electrophysiologist:  None   Referring MD: Andre Norleen BROCKS, MD   No chief complaint on file.   History of Present Illness:    Andre Martinez is a 68 y.o. male with a hx of CAD with NSTEMI and DES to RCA on 09/30/2021, hypertension, hyperlipidemia, PVCs, GERD who presents for follow-up.  He was hospitalized 09/2021 with chest pain, found to have NSTEMI.  LHC showed 50% stenosis of mid RCA followed by 90% stenosis of distal RCA.  Underwent successful PCI with DES to distal RCA.  Echocardiogram showed EF 60 to 65%.  He reported chest pain and underwent stress PET 09/2023, which was intermediate risk study showing mid to basal inferior ischemia.  LHC 10/18/2023 showed 50% proximal to mid RCA stenosis, 50% mid to distal RCA stenosis (reduced to 10% with intracoronary nitroglycerin ), patent distal RCA stent.  Given improvement with intracoronary nitroglycerin , suspected coronary vasospasm as etiology of symptoms and recommended starting amlodipine .  Since last clinic visit,  he report that he is feeling improved.  He has not been having chest pain since his heart catheterization.  Reports some lightheadedness with standing but denies any syncope.  He continues to walk daily.  Denies any dyspnea, lower extremity edema, or palpitations.      Past Medical History:  Diagnosis Date   Allergy    Arthritis    Coronary artery disease    GERD (gastroesophageal reflux disease)    Hemorrhoids    Hyperlipidemia    Hypertension    Sinusitis 12/94 3/95    Past Surgical History:  Procedure Laterality Date   CARDIAC CATHETERIZATION     COLONOSCOPY  2006   DR.HAYES   CORONARY PRESSURE/FFR STUDY N/A 10/18/2023   Procedure: CORONARY PRESSURE/FFR STUDY;  Surgeon: Anner Alm ORN, MD;  Location: [facility] INVASIVE CV LAB;  Service:  Cardiovascular;  Laterality: N/A;   CORONARY STENT INTERVENTION N/A 09/30/2021   Procedure: CORONARY STENT INTERVENTION;  Surgeon: Dann Candyce RAMAN, MD;  Location: [facility] INVASIVE CV LAB;  Service: Cardiovascular;  Laterality: N/A;   LEFT HEART CATH AND CORONARY ANGIOGRAPHY N/A 09/30/2021   Procedure: LEFT HEART CATH AND CORONARY ANGIOGRAPHY;  Surgeon: Dann Candyce RAMAN, MD;  Location: [facility] INVASIVE CV LAB;  Service: Cardiovascular;  Laterality: N/A;   LEFT HEART CATH AND CORONARY ANGIOGRAPHY N/A 10/18/2023   Procedure: LEFT HEART CATH AND CORONARY ANGIOGRAPHY;  Surgeon: Anner Alm ORN, MD;  Location: [facility] INVASIVE CV LAB;  Service: Cardiovascular;  Laterality: N/A;   rectal fissure     REPAIR   SKIN BIOPSY Left 07/25/2018   see report in chat   SKIN BIOPSY Left 07/08/2021   dysplastic compound nevs with severe atypia margin close   SKIN BIOPSY Left 11/04/2021   no residual dysplastic nevu   TONSILLECTOMY     TOTAL KNEE ARTHROPLASTY Left 05/21/2018   Procedure: LEFT TOTAL KNEE ARTHROPLASTY;  Surgeon: Melodi Lerner, MD;  Location: [facility];  Service: Orthopedics;  Laterality: Left;    UPPER GI ENDOSCOPY  04/08/2013   normal esophagus,multiple gastric polyps,normal examined duodenum    Current Medications: No outpatient medications have been marked as taking for the 05/03/24 encounter (Appointment) with Martinez Andre LITTIE, MD.     Allergies:   Codeine, Aciphex [rabeprazole], Penicillins, and Prevacid [lansoprazole]   Social History  Socioeconomic History   Marital status: Married    Spouse name: Not on file   Number of children: Not on file   Years of education: Not on file   Highest education level: Bachelor's degree (e.g., BA, AB, BS)  Occupational History   Not on file  Tobacco Use   Smoking status: Former    Types: Cigars   Smokeless tobacco: Never  Vaping Use   Vaping status: Never Used  Substance and Sexual Activity   Alcohol use: Yes    Alcohol/week: 3.0  standard drinks of alcohol    Types: 3 Cans of beer per week    Comment: occasionaly   Drug use: No   Sexual activity: Yes    Partners: Female    Comment: MARRIED  Other Topics Concern   Not on file  Social History Narrative   Not on file   Social Drivers of Health   Tobacco Use: Medium Risk (04/30/2024)   Patient History    Smoking Tobacco Use: Former    Smokeless Tobacco Use: Never    Passive Exposure: Not on file  Financial Resource Strain: Low Risk (04/26/2024)   Overall Financial Resource Strain (CARDIA)    Difficulty of Paying Living Expenses: Not hard at all  Food Insecurity: No Food Insecurity (04/26/2024)   Epic    Worried About Radiation Protection Practitioner of Food in the Last Year: Never true    Ran Out of Food in the Last Year: Never true  Transportation Needs: No Transportation Needs (04/26/2024)   Epic    Lack of Transportation (Medical): No    Lack of Transportation (Non-Medical): No  Physical Activity: Sufficiently Active (04/26/2024)   Exercise Vital Sign    Days of Exercise per Week: 7 days    Minutes of Exercise per Session: 40 min  Stress: No Stress Concern Present (04/26/2024)   [employer] of Occupational Health - Occupational Stress Questionnaire    Feeling of Stress: Only a little  Social Connections: Socially Integrated (04/26/2024)   Social Connection and Isolation Panel    Frequency of Communication with Friends and Family: Twice a week    Frequency of Social Gatherings with Friends and Family: Once a week    Attends Religious Services: 1 to 4 times per year    Active Member of Clubs or Organizations: Yes    Attends Banker Meetings: 1 to 4 times per year    Marital Status: Married  Depression (PHQ2-9): Low Risk (04/30/2024)   Depression (PHQ2-9)    PHQ-2 Score: 0  Alcohol Screen: Low Risk (04/26/2024)   Alcohol Screen    Last Alcohol Screening Score (AUDIT): 3  Housing: Low Risk (04/26/2024)   Epic    Unable to Pay for Housing in the  Last Year: No    Number of Times Moved in the Last Year: 0    Homeless in the Last Year: No  Utilities: Not At Risk (04/30/2024)   Epic    Threatened with loss of utilities: No  Health Literacy: Adequate Health Literacy (04/30/2024)   B1300 Health Literacy    Frequency of need for help with medical instructions: Never     Family History: The patient's family history includes Diabetes in his mother; Heart disease in his father and paternal grandfather; Hypertension in his father and mother.  ROS:   Please see the history of present illness.     All other systems reviewed and are negative.  EKGs/Labs/Other Studies Reviewed:    The following studies  were reviewed today:  EKG:   01/06/2023: Sinus bradycardia, rate 53, nonspecific T wave flattening 07/06/2023: Normal sinus rhythm, rate 63, no ST abnormalities 10/12/2023: Sinus bradycardia, rate 57  Recent Labs: 05/23/2023: ALT 16 10/12/2023: BUN 17; Creatinine, Ser 0.89; Hemoglobin 15.6; Platelets 210; Potassium 5.0; Sodium 142  Recent Lipid Panel    Component Value Date/Time   CHOL 125 05/23/2023 1023   TRIG 66 05/23/2023 1023   HDL 52 05/23/2023 1023   CHOLHDL 2.4 05/23/2023 1023   CHOLHDL 3.1 10/01/2021 0152   VLDL 21 10/01/2021 0152   LDLCALC 59 05/23/2023 1023    Physical Exam:    VS:  There were no vitals taken for this visit.    Wt Readings from Last 3 Encounters:  04/30/24 177 lb (80.3 kg)  03/12/24 182 lb (82.6 kg)  11/08/23 175 lb 6.4 oz (79.6 kg)     GEN:  Well nourished, well developed in no acute distress HEENT: Normal NECK: No JVD; No carotid bruits LYMPHATICS: No lymphadenopathy CARDIAC: RRR, no murmurs, rubs, gallops RESPIRATORY:  Clear to auscultation without rales, wheezing or rhonchi  ABDOMEN: Soft, non-tender, non-distended MUSCULOSKELETAL:  No edema; No deformity  SKIN: Warm and dry NEUROLOGIC:  Alert and oriented x 3 PSYCHIATRIC:  Normal affect   ASSESSMENT:    No diagnosis  found.    PLAN:    CAD: hospitalized 09/2021 with chest pain, found to have NSTEMI.  LHC showed 50% stenosis of mid RCA followed by 90% stenosis of distal RCA.  Underwent successful PCI with DES to distal RCA.  Echocardiogram showed EF 60 to 65%.  He reported atypical chest pain and underwent stress PET 09/2023, which was intermediate risk study showing mid to basal inferior ischemia. LHC 10/18/2023 showed 50% proximal to mid RCA stenosis, 50% mid to distal RCA stenosis (reduced to 10% with 2 coronary nitroglycerin ), patent distal RCA stent.  Given improvement with intracoronary nitroglycerin , suspected coronary vasospasm as etiology of symptoms and recommended starting amlodipine . -Continue aspirin  81 mg daily.   -Continue Toprol -XL 25 mg daily and amlodipine  2.5 mg daily -Continue atorvastatin  40 mg daily  PVCs: Noted to have frequent PVCs during admission 09/2021 as well as NSVT.  Zio patch x 14 days 09/2021 showed 1 episode of NSVT lasting 6 beats, rare PVCs. -Continue Toprol -XL 25 mg daily  Hypertension: Continue Toprol -XL, amlodipine   Hyperlipidemia: Continue atorvastatin  40 mg daily, LDL 59 on 05/23/2023  RTC in 6 months***  Medication Adjustments/Labs and Tests Ordered: Current medicines are reviewed at length with the patient today.  Concerns regarding medicines are outlined above.  No orders of the defined types were placed in this encounter.  No orders of the defined types were placed in this encounter.   There are no Patient Instructions on file for this visit.   Signed, Andre LITTIE Nanas, MD  05/02/2024 4:13 PM    [facility]

## 2024-05-03 ENCOUNTER — Ambulatory Visit: Attending: Cardiology | Admitting: Cardiology

## 2024-05-03 ENCOUNTER — Encounter: Payer: Self-pay | Admitting: Cardiology

## 2024-05-03 VITALS — BP 98/68 | HR 67 | Ht 74.0 in | Wt 178.0 lb

## 2024-05-03 DIAGNOSIS — E785 Hyperlipidemia, unspecified: Secondary | ICD-10-CM

## 2024-05-03 DIAGNOSIS — R5383 Other fatigue: Secondary | ICD-10-CM

## 2024-05-03 DIAGNOSIS — I493 Ventricular premature depolarization: Secondary | ICD-10-CM

## 2024-05-03 DIAGNOSIS — I2510 Atherosclerotic heart disease of native coronary artery without angina pectoris: Secondary | ICD-10-CM | POA: Diagnosis not present

## 2024-05-03 MED ORDER — METOPROLOL SUCCINATE ER 25 MG PO TB24: 12.5000 mg | ORAL_TABLET | Freq: Every day | ORAL | 3 refills | Status: DC

## 2024-05-03 NOTE — Patient Instructions (Signed)
 Medication Instructions:  DECREASE METOPROLOL  SUCCINATE TO 12.5 MG DAILY  *If you need a refill on your cardiac medications before your next appointment, please call your pharmacy*  Lab Work: CMET, CBC, TSH, HEMOGLOBIN A1C AND FASTING LIPID PANEL TODAY If you have labs (blood work) drawn today and your tests are completely normal, you will receive your results only by: MyChart Message (if you have MyChart) OR A paper copy in the mail If you have any lab test that is abnormal or we need to change your treatment, we will call you to review the results.  Testing/Procedures: NO TESTING  Follow-Up: At [facility], you and your health needs are our priority.  As part of our continuing mission to provide you with exceptional heart care, our providers are all part of one team.  This team includes your primary Cardiologist (physician) and Advanced Practice Providers or APPs (Physician Assistants and Nurse Practitioners) who all work together to provide you with the care you need, when you need it.  Your next appointment:   6 month(s)  Provider:   Lonni LITTIE Nanas, MD

## 2024-05-04 LAB — COMPREHENSIVE METABOLIC PANEL WITH GFR
ALT: 22 IU/L (ref 0–44)
AST: 24 IU/L (ref 0–40)
Albumin: 4 g/dL (ref 3.9–4.9)
Alkaline Phosphatase: 102 IU/L (ref 47–123)
BUN/Creatinine Ratio: 17 (ref 10–24)
BUN: 17 mg/dL (ref 8–27)
Bilirubin Total: 0.6 mg/dL (ref 0.0–1.2)
CO2: 24 mmol/L (ref 20–29)
Calcium: 9.2 mg/dL (ref 8.6–10.2)
Chloride: 102 mmol/L (ref 96–106)
Creatinine, Ser: 0.99 mg/dL (ref 0.76–1.27)
Globulin, Total: 2 g/dL (ref 1.5–4.5)
Glucose: 85 mg/dL (ref 70–99)
Potassium: 4.7 mmol/L (ref 3.5–5.2)
Sodium: 140 mmol/L (ref 134–144)
Total Protein: 6 g/dL (ref 6.0–8.5)
eGFR: 82 mL/min/1.73

## 2024-05-04 LAB — TSH: TSH: 1.88 u[IU]/mL (ref 0.450–4.500)

## 2024-05-04 LAB — CBC
Hematocrit: 48.6 % (ref 37.5–51.0)
Hemoglobin: 15.5 g/dL (ref 13.0–17.7)
MCH: 30 pg (ref 26.6–33.0)
MCHC: 31.9 g/dL (ref 31.5–35.7)
MCV: 94 fL (ref 79–97)
Platelets: 224 x10E3/uL (ref 150–450)
RBC: 5.16 x10E6/uL (ref 4.14–5.80)
RDW: 12.2 % (ref 11.6–15.4)
WBC: 7.4 x10E3/uL (ref 3.4–10.8)

## 2024-05-04 LAB — HEMOGLOBIN A1C
Est. average glucose Bld gHb Est-mCnc: 114 mg/dL
Hgb A1c MFr Bld: 5.6 % (ref 4.8–5.6)

## 2024-05-04 LAB — LIPID PANEL
Chol/HDL Ratio: 2.5 ratio (ref 0.0–5.0)
Cholesterol, Total: 118 mg/dL (ref 100–199)
HDL: 48 mg/dL
LDL Chol Calc (NIH): 55 mg/dL (ref 0–99)
Triglycerides: 72 mg/dL (ref 0–149)
VLDL Cholesterol Cal: 15 mg/dL (ref 5–40)

## 2024-05-06 ENCOUNTER — Ambulatory Visit: Payer: Self-pay | Admitting: Cardiology

## 2024-05-14 ENCOUNTER — Encounter: Payer: Self-pay | Admitting: Cardiology

## 2024-05-21 ENCOUNTER — Encounter: Payer: Self-pay | Admitting: Cardiology

## 2024-05-22 ENCOUNTER — Telehealth: Payer: Self-pay

## 2024-05-22 MED ORDER — METOPROLOL SUCCINATE ER 25 MG PO TB24: 12.5000 mg | ORAL_TABLET | Freq: Every day | ORAL | 3 refills | Status: AC

## 2024-05-22 MED ORDER — AMLODIPINE BESYLATE 2.5 MG PO TABS: 2.5000 mg | ORAL_TABLET | Freq: Every day | ORAL | 3 refills | Status: AC

## 2024-05-22 NOTE — Telephone Encounter (Signed)
 Meds sent  to pharmacy of choice per patient request.

## 2024-06-05 ENCOUNTER — Ambulatory Visit
Admission: RE | Admit: 2024-06-05 | Discharge: 2024-06-05 | Disposition: A | Source: Ambulatory Visit | Attending: Family Medicine

## 2024-06-05 ENCOUNTER — Other Ambulatory Visit: Payer: Self-pay | Admitting: Family Medicine

## 2024-06-05 ENCOUNTER — Encounter: Payer: Self-pay | Admitting: Family Medicine

## 2024-06-05 ENCOUNTER — Ambulatory Visit (INDEPENDENT_AMBULATORY_CARE_PROVIDER_SITE_OTHER): Payer: Medicare Other | Admitting: Family Medicine

## 2024-06-05 VITALS — BP 118/74 | HR 76 | Ht 74.0 in | Wt 179.4 lb

## 2024-06-05 DIAGNOSIS — Z125 Encounter for screening for malignant neoplasm of prostate: Secondary | ICD-10-CM | POA: Diagnosis not present

## 2024-06-05 DIAGNOSIS — I214 Non-ST elevation (NSTEMI) myocardial infarction: Secondary | ICD-10-CM | POA: Diagnosis not present

## 2024-06-05 DIAGNOSIS — Z23 Encounter for immunization: Secondary | ICD-10-CM

## 2024-06-05 DIAGNOSIS — R5383 Other fatigue: Secondary | ICD-10-CM | POA: Diagnosis not present

## 2024-06-05 DIAGNOSIS — G8929 Other chronic pain: Secondary | ICD-10-CM

## 2024-06-05 DIAGNOSIS — Z0000 Encounter for general adult medical examination without abnormal findings: Secondary | ICD-10-CM | POA: Diagnosis not present

## 2024-06-05 DIAGNOSIS — M25561 Pain in right knee: Secondary | ICD-10-CM

## 2024-06-05 DIAGNOSIS — N529 Male erectile dysfunction, unspecified: Secondary | ICD-10-CM | POA: Diagnosis not present

## 2024-06-05 LAB — POCT URINE DIPSTICK
Bilirubin, UA: NEGATIVE
Blood, UA: NEGATIVE
Glucose, UA: NEGATIVE mg/dL
Ketones, POC UA: NEGATIVE mg/dL
Leukocytes, UA: NEGATIVE
Nitrite, UA: NEGATIVE
POC PROTEIN,UA: NEGATIVE
Spec Grav, UA: 1.01
Urobilinogen, UA: 0.2 U/dL
pH, UA: 6

## 2024-06-05 NOTE — Patient Instructions (Addendum)
 Creatine Monohydrate, 5g a day (1 scoop)  Magnesium Glycinate   Please go get your xrays done at [facility]. You do not need to make an appointment. You can just show up.   Address: [address] [place], [place], [place] [address]

## 2024-06-05 NOTE — Progress Notes (Signed)
 "  Name: Andre Martinez   Date of Visit: 06/05/24   Date of last visit with me: Visit date not found   CHIEF COMPLAINT:  Chief Complaint  Patient presents with   Annual Exam    Cpe.        HPI:  Discussed the use of AI scribe software for clinical note transcription with the patient, who gave verbal consent to proceed.  History of Present Illness   Andre Martinez is a 69 year old male with a history of myocardial infarction who presents with fatigue during physical activity.  He experiences fatigue primarily during walking, describing it as his legs not being able to 'push' him. This fatigue is absent when using a bike or elliptical machine. He walks daily and stretches for about thirty minutes each morning. He has a history of a heart attack four years ago and has been on metoprolol , which was recently reduced. He suspects the medication might be contributing to his fatigue but notes a slight improvement recently. He is borderline diabetic, with a recent A1c of 5.6.  He has a history of left knee replacement and receives Visco knee injections for his right knee, which he is trying to delay replacing. The injections are helpful, though not completely remedial, and have been administered twice before, typically every six months. He wears a knee sleeve for support during activities.  He has been taking pantoprazole  for acid reflux for a long time and has tried other medications like Pepcid  in the past, which were less effective. He takes pantoprazole  once a day.  He experiences erectile dysfunction and is unable to take prescription medications for it due to his heart condition. He has not explored other options yet.         OBJECTIVE:       06/05/2024    9:03 AM  Depression screen PHQ 2/9  Decreased Interest 0  Down, Depressed, Hopeless 0  PHQ - 2 Score 0     BP Readings from Last 3 Encounters:  06/05/24 118/74  05/03/24 98/68  03/12/24 110/60    BP 118/74    Pulse 76   Ht 6' 2 (1.88 m)   Wt 179 lb 6.4 oz (81.4 kg)   SpO2 98%   BMI 23.03 kg/m    Physical Exam          Physical Exam Constitutional:      Appearance: Normal appearance.  Neurological:     General: No focal deficit present.     Mental Status: He is alert and oriented to person, place, and time. Mental status is at baseline.     ASSESSMENT/PLAN:   Assessment & Plan Non-ST elevation (NSTEMI) myocardial infarction [facility])  Need for COVID-19 vaccine  Annual physical exam  Other fatigue  Encounter for screening prostate specific antigen (PSA) measurement  Chronic pain of right knee  Erectile dysfunction, unspecified erectile dysfunction type    Assessment and Plan    Fatigue Likely due to muscle mass loss and lack of strength training. Cardiovascular health and A1c normal, ruling out diabetes. Fatigue during walking suggests need for muscle strengthening. - Incorporate strength training twice a week focusing on lower body exercises. - Recommended creatine monohydrate supplement daily to enhance muscle performance. - Recommended magnesium glycinate supplement for additional support.  History of non-ST elevation myocardial infarction Myocardial infarction occurred four years ago. Currently on reduced dose of metoprolol . No current cardiac symptoms during exercise. Fatigue not attributed to cardiac issues. -  Continue current cardiac management and medications.  Right knee osteoarthritis with chronic pain Chronic pain managed with visco-supplementation injections. Previous x-rays showed mild arthritis, likely progressed. Gel injections provide some relief, but effectiveness may decrease with joint space reduction. Steroid injections considered if additional benefit is needed. - Ordered updated x-rays of the right knee at [facility]. - Consider alternating gel and steroid injections if additional benefit is needed. - Encouraged wearing a knee sleeve during  prolonged walking. - Encouraged strength training to reduce knee pressure.  Erectile dysfunction Unable to take prescription medications due to cardiac history. Testosterone  replacement therapy considered. - Ordered testosterone  level test with current blood work. - Will schedule follow-up lab appointment for morning testosterone  level test.  Gastroesophageal reflux disease Long-term use of pantoprazole  for GERD. Previous trials of famotidine  were less effective. Benefits of pantoprazole  outweigh risks. - Continue pantoprazole  once daily.  Prostate cancer screening PSA screening recommended as part of routine health maintenance. - Ordered PSA test.     Annual Physical -Comprehensive annual physical exam completed today. Reviewed interval history, current medical issues, medications, allergies, and preventive care needs. Addressed all patient questions and concerns. Discussed lifestyle factors including diet, exercise, sleep, and stress management. Reviewed recommended age-appropriate screenings, labs, and vaccinations. Counseling provided on healthy habits and routine health maintenance. Follow-up as indicated based on findings and results. ->=15 minutes spent in face-to-face counseling focused on reducing cardiovascular risk.  Topics reviewed: - Assessment of individual CVD risk factors (BP, lipids, glucose, weight, activity level) - Lifestyle modifications including heart-healthy diet, sodium reduction, and increased physical activity - Smoking cessation and alcohol moderation when applicable - Importance of blood pressure control, lipid optimization, and medication adherence - Personalized risk-reduction plan discussed and agreed upon     Daun Rens A. Vita MD [facility] "

## 2024-06-06 ENCOUNTER — Ambulatory Visit: Payer: Self-pay | Admitting: Family Medicine

## 2024-06-07 ENCOUNTER — Encounter: Payer: Self-pay | Admitting: Family Medicine

## 2024-06-07 LAB — TESTOSTERONE,FREE AND TOTAL
Testosterone, Free: 4.8 pg/mL — AB (ref 6.6–18.1)
Testosterone: 733 ng/dL (ref 264–916)

## 2024-06-07 LAB — PSA: Prostate Specific Ag, Serum: 0.6 ng/mL (ref 0.0–4.0)

## 2024-06-11 NOTE — Progress Notes (Signed)
 Results thru my chart

## 2024-06-26 ENCOUNTER — Ambulatory Visit (HOSPITAL_COMMUNITY): Admission: RE | Admit: 2024-06-26 | Payer: Self-pay | Source: Home / Self Care | Admitting: Gastroenterology

## 2024-06-26 ENCOUNTER — Encounter (HOSPITAL_COMMUNITY): Admission: RE | Payer: Self-pay | Source: Home / Self Care

## 2024-06-26 SURGERY — MANOMETRY, ESOPHAGUS

## 2025-06-09 ENCOUNTER — Encounter: Admitting: Family Medicine
